# Patient Record
Sex: Male | Born: 1965 | Race: White | Hispanic: No | Marital: Married | Smoking: Former smoker
Health system: Southern US, Community
[De-identification: ages and names within clinical notes are randomized; demographics above are authoritative.]

## PROBLEM LIST (undated history)

## (undated) DIAGNOSIS — M179 Osteoarthritis of knee, unspecified: Secondary | ICD-10-CM

## (undated) DIAGNOSIS — M1710 Unilateral primary osteoarthritis, unspecified knee: Secondary | ICD-10-CM

## (undated) DIAGNOSIS — I10 Essential (primary) hypertension: Secondary | ICD-10-CM

## (undated) DIAGNOSIS — R7303 Prediabetes: Secondary | ICD-10-CM

## (undated) DIAGNOSIS — N200 Calculus of kidney: Secondary | ICD-10-CM

## (undated) DIAGNOSIS — M722 Plantar fascial fibromatosis: Secondary | ICD-10-CM

## (undated) HISTORY — DX: Plantar fascial fibromatosis: M72.2

## (undated) HISTORY — DX: Osteoarthritis of knee, unspecified: M17.9

## (undated) HISTORY — DX: Prediabetes: R73.03

## (undated) HISTORY — DX: Calculus of kidney: N20.0

## (undated) HISTORY — DX: Unilateral primary osteoarthritis, unspecified knee: M17.10

---

## 1985-10-22 HISTORY — PX: KNEE ARTHROSCOPY: SHX127

## 2000-08-11 ENCOUNTER — Emergency Department (HOSPITAL_COMMUNITY): Admission: EM | Admit: 2000-08-11 | Discharge: 2000-08-11 | Payer: Self-pay | Admitting: Emergency Medicine

## 2000-08-29 ENCOUNTER — Encounter: Payer: Self-pay | Admitting: Emergency Medicine

## 2000-08-29 ENCOUNTER — Inpatient Hospital Stay (HOSPITAL_COMMUNITY): Admission: EM | Admit: 2000-08-29 | Discharge: 2000-09-02 | Payer: Self-pay | Admitting: Emergency Medicine

## 2000-08-29 ENCOUNTER — Encounter: Payer: Self-pay | Admitting: Internal Medicine

## 2000-08-30 ENCOUNTER — Encounter: Payer: Self-pay | Admitting: Internal Medicine

## 2000-08-31 ENCOUNTER — Encounter: Payer: Self-pay | Admitting: Internal Medicine

## 2000-09-09 ENCOUNTER — Ambulatory Visit (HOSPITAL_COMMUNITY): Admission: RE | Admit: 2000-09-09 | Discharge: 2000-09-09 | Payer: Self-pay | Admitting: Internal Medicine

## 2000-10-07 ENCOUNTER — Encounter: Payer: Self-pay | Admitting: Emergency Medicine

## 2000-10-07 ENCOUNTER — Inpatient Hospital Stay (HOSPITAL_COMMUNITY): Admission: EM | Admit: 2000-10-07 | Discharge: 2000-10-10 | Payer: Self-pay | Admitting: *Deleted

## 2000-10-18 ENCOUNTER — Encounter: Admission: RE | Admit: 2000-10-18 | Discharge: 2000-10-18 | Payer: Self-pay | Admitting: Internal Medicine

## 2005-06-29 ENCOUNTER — Emergency Department (HOSPITAL_COMMUNITY): Admission: EM | Admit: 2005-06-29 | Discharge: 2005-06-29 | Payer: Self-pay | Admitting: Emergency Medicine

## 2010-05-30 ENCOUNTER — Emergency Department (HOSPITAL_COMMUNITY): Admission: EM | Admit: 2010-05-30 | Discharge: 2010-05-31 | Payer: Self-pay | Admitting: Emergency Medicine

## 2011-01-05 LAB — CBC
HCT: 43.1 % (ref 39.0–52.0)
MCV: 90.7 fL (ref 78.0–100.0)
Platelets: 114 10*3/uL — ABNORMAL LOW (ref 150–400)
RDW: 12.5 % (ref 11.5–15.5)

## 2011-01-05 LAB — URINALYSIS, ROUTINE W REFLEX MICROSCOPIC
Bilirubin Urine: NEGATIVE
Glucose, UA: NEGATIVE mg/dL
Nitrite: NEGATIVE
Protein, ur: NEGATIVE mg/dL
Specific Gravity, Urine: 1.024 (ref 1.005–1.030)

## 2011-01-05 LAB — URINE MICROSCOPIC-ADD ON

## 2011-01-05 LAB — BASIC METABOLIC PANEL
Calcium: 9.3 mg/dL (ref 8.4–10.5)
Chloride: 107 mEq/L (ref 96–112)
Glucose, Bld: 112 mg/dL — ABNORMAL HIGH (ref 70–99)
Potassium: 4.1 mEq/L (ref 3.5–5.1)

## 2011-01-05 LAB — URINE CULTURE
Culture  Setup Time: 201108100210
Culture: NO GROWTH

## 2011-01-05 LAB — DIFFERENTIAL
Basophils Absolute: 0 10*3/uL (ref 0.0–0.1)
Monocytes Relative: 8 % (ref 3–12)
Neutro Abs: 5.3 10*3/uL (ref 1.7–7.7)

## 2011-03-09 NOTE — Consult Note (Signed)
[place]. [facility]  Patient:    Spencer Humphrey, Spencer Humphrey                      MRN: [id number] Proc. Date: 08/30/00 Adm. Date:  [id number] Attending:  Farley Ly CC:         Medical Teaching Service   Consultation Report  REASON FOR CONSULTATION:  Thank you for asking me to see this 45 year old white male for cardiologic opinion regarding chest discomfort.  HISTORY OF PRESENT ILLNESS:  The patient has a somewhat sketchy past medical history. He admits to being treated for alcohol abuse by Dr. Jeannetta Nap office in [facility]. He, according to a private history obtained, has a history of alcohol abuse dating back to his high school years. He has a prior history of DTs according to his wife, greater than 5 years ago. Was in [employer] maybe in the late 1990s. He admits to having been consuming at least a pint of alcohol for the past year. Thinks his last drink was 2 days prior to admission. He states that he was walking in the grocery store and had a nosebleed. Had difficulty picking up milk with his left arm and then had what he described as crushing substernal chest pain. He presented to the emergency room where an EKG was unremarkable, and initial CPKs were unremarkable. He complained of inability with his grip, a headache, and significant shortness of breath. He presented with some palpitations also. He presented to the emergency room where he was found to be hypertensive and had alcohol on his breath. He was admitted yesterday with a presumed diagnosis of unstable angina pectoris. Initial enzymes were unremarkable, but he continued to have chest pain and has been treated aggressively with Lovenox and with nitroglycerin. He had a seizure this evening on the floor, and he was moved into the intensive care unit. When I initially questioned him, he was not certain why he entered the hospital and then remembered that he came here because of chest pain  and the other above-noted complaints. I was requested to give an opinion regarding the chest pain for the teaching service who suspected unstable angina in a patient with multiple risks.  PAST MEDICAL HISTORY:  Fairly benign. There maybe a prior history of mild hypertension for the past month. There is no history of ulcers or diabetes. His cholesterol status is unknown.  PAST SURGICAL HISTORY:  Knee surgery.  ALLERGIES:  PENICILLIN.  MEDICATIONS PRIOR TO DISCHARGE:  None.  SOCIAL HISTORY:  He works as a Engineer, maintenance, and he smokes between a pack and pack and a half of cigarettes per day. Alcohol history as above. He has been married to his current wife for 3 years. Was formerly married before that and has one child by a previous marriage and they share a 58-year-old child. They have three small children that live in the home with them, ages 32, 5, and 2. Remote history of marijuana abuse. No cocaine or other drug usage that he will admit to.  FAMILY HISTORY:  His mother had a myocardial infarction approximately 1 year ago in her early 77s. His father is a diabetic.  REVIEW OF SYSTEMS:  He has had some vague weight loss. He denies PND, orthopnea, or edema. He has not had a prior history of exertional-type chest pain. Does have the significant alcohol intoxication as noted above.  PHYSICAL EXAMINATION:  GENERAL:  He is a somewhat red-faced male who  is not currently in any acute distress.  VITAL SIGNS:  His blood pressure was 140/80, his pulse was 76 and was regular.  SKIN:  Erythematous.  ENT:  Unremarkable.  NECK:  There was no thyromegaly or carotid bruits.  LUNGS:  Clear bilaterally.  CARDIAC:  Normal S1 and S2. No S3, S4, or murmur.  ABDOMEN:  Soft and nontender. No mass or organomegaly. There was no rebound tenderness noted.  EXTREMITIES:  Femoral and distal pulses were 2+.  LABORATORY DATA:  Initial ECGs were completely normal.  Lab data shows a  cholesterol of 373, triglyceride of 1843, with an HDL of 34. He has a lipase of 58. His alkaline phosphatase is 279, SGOT of 430, and SGPT of 80, albumin is only 3.2. Protime was 14.2 sec which was slightly elevated. CBC showed a hemoglobin of 14.4, hematocrit of 39.3.  IMPRESSION: 1. Chest discomfort with some typical and other atypical features in a patient    with multiple cardiovascular risks factors, including an early positive    family history, dyslipidemic cholesterol panel, and a history of cigarette    abuse, and some hypertension on admission. It is very difficult because of    the alcoholism to sort out whether the chest pain is ischemic in nature or    whether it is noncardiac. In addition, he has multiple metabolic    abnormalities related to acute alcohol abuse which make workup problematic    at this time. 2. Hypertension on admission may be related to alcoholism. 3. Former history of alcoholism. See note above. 4. Presumed delirium tremens with recent seizure. 5. Recent nosebleed. 6. Thrombocytopenia possibly due to alcoholism. 7. Dyslipidemia.  RECOMMENDATIONS:  At the present time, the patient is not able to give a reliable history. He has a normal EKG but does have multiple cardiovascular risk factors. All of his cardiac enzymes show no evidence of ischemia or infarction at this time. He has markedly abnormal liver enzymes. My recommendation would be to continue treatment for delirium tremens and acute alcoholism. I would stop his nitroglycerin at the present time. He has thrombocytopenia and I may consider stopping Lovenox because of the thrombocytopenia. I would treat him with fairly high-dose beta blockers and observe for further evidence of DTs, allow liver enzymes to return to normal. He does have a fairly high-risk lipid panel; but in the presence of acute alcoholism, it is hard to tell the validity of this. He does need to stop smoking, does need control of  lipids, and does need to have a cardiac workup.  However, it is difficult to determine whether that needs to be done while he is an inpatient or whether this can be done as an outpatient once he is over the acute alcohol episode. I will be glad to follow him along with you, and I appreciate seeing him with you. DD:  08/30/00 TD:  08/31/00 Job: [id number]

## 2011-03-09 NOTE — Discharge Summary (Signed)
[place]. [facility]  Patient:    Spencer Humphrey, Spencer Humphrey                      MRN: [id number] Adm. Date:  [id number] Disc. Date: [id number] Attending:  Arsenio Loader Dictator:   Doreatha Martin, M.D.                           Discharge Summary  DISCHARGE DIAGNOSES: 1. Alcoholic pancreatitis. 2. Alcohol abuse. 3. Hypertension. 4. Alcoholic liver disease. 5. Hyponatremia. 6. History of delirium tremens.  DISCHARGE MEDICATIONS: 1. Folate 1 mg p.o. q.d. 2. Thiamine 100 mg p.o. q.d. 3. Lopressor 50 mg p.o. b.i.d.  HISTORY OF PRESENT ILLNESS:  Spencer Humphrey is a 45 year old white male with a history significant for hypertension, pancreatitis, and alcohol abuse.  He presents with a three-day complaint abdominal pain that radiates through his back, and he is also complaining of nausea and vomiting as well as decreased appetite.  The patient denies any hematemesis, hematochezia, or melena.  The patient also denies diarrhea.  He states that the pain is a 9/10 at its worst. He does deny fever, chills, and shortness of breath.  He does complain of decreased urine output; however, he denies dysuria.  Right now, he is complaining of right lower quadrant pain.  The patient denies chest pain, shortness of breath, cough, headache.  The patient states he drank one beer the night prior to presentation to the emergency room while in front of his wife.  However, when questioned by himself, he does admit to having been drinking quite heavily for the past several days.  PAST MEDICAL HISTORY:  Significant for hypertension, pancreatitis, alcohol abuse, nephrolithiasis x 2, and a UTI in November 2001.  ALLERGIES:  PENICILLIN gives him a rash.  MEDICATIONS:  He is not taking any, but he states that he is supposed to be on Lopressor 50 mg twice a day, thiamine, aspirin, folate, and Protonix 40 mg every day.  SOCIAL HISTORY:  He lives with his wife and children in [place] and  works as a heavy Media planner.  He states that he had been sober for the past four years while he was attending AA meeting but that he "fell off the wagon." He denies having lost any work recently.  He has smoked a pack a day for the past four years and denies any IV drug use.  FAMILY HISTORY:  Significant for a father with diabetes and a mother who experienced a myocardial infarction as well as ovarian cancer.  PHYSICAL EXAMINATION:  VITAL SIGNS:  On admission, temperature 98.7, pulse 130, blood pressure 148/102, respirations 22, oxygen saturation 98% on room air.  GENERAL:  This is a pleasant, well groomed white male in no apparent distress who denies any use of alcohol while in the presence of his wife and has difficulty admitting it when alone.  HEENT:  PERRLA.  No icterus.  No oropharyngeal lesions.  NECK:  No JVD, no thyromegaly, no lymphadenopathy, supple.  LUNGS:  Clear to auscultation bilaterally.  CARDIOVASCULAR:  Regular rate and rhythm, S1, S2.  No murmurs, rubs, or gallops.  ABDOMEN:  Soft.  Tender right lower quadrant and epigastrium with some guarding, increased bowel sounds, nondistended.  EXTREMITIES:  No edema.  Pedal pulses 2+ bilaterally.  RECTAL:  Stool heme negative.  LABORATORY DATA:  White blood cells 9.0, hemoglobin 13.9, platelets 119. Sodium 127, potassium 3.5, chloride 89, bicarb  22, BUN 13, creatinine 1.2, glucose 122.  Calcium 8.3, total protein 6.7, albumin 3.1, lipase 190, total bilirubin 2.9, AST 204, ALT 127, alkaline phosphatase 322.  Urinalysis: Specific gravity 1.025, protein 30, otherwise within normal limits.  CT of the abdomen without contrast showed gallbladder sludge and a fatty liver.  No nephrolithiasis.  CT of the abdomen with contrast showed a fatty liver and a prominent pancreatic head that may be indicative of early pancreatitis.  Vitamin B12 within normal limits.  Folate level within normal limits.  TSH within  normal limits.  HISTORY OF PRESENT ILLNESS:  #1 - ALCOHOLIC PANCREATITIS:  The patient was admitted and made n.p.o. Aggressive IV hydration was started with D5 normal saline at 250 cc per hour with potassium chloride supplementation.  The patient was given pain control with morphine sulfate 5 to 10 mg IV every 4 hours p.r.n.  His lipase on admission was 190.  His alcohol level was less than 10.  The patient remained n.p.o. for the next 24 to 36 hours, and on the third hospital day, he was started on a clear liquid diet which he tolerated well and was therefore advanced to a full diet by dinner time on the third day of hospital stay.  At this time, IV fluids and pain medications were discontinued.  #2 - ALCOHOL ABUSE:  The patient was given Librium for delirium tremens precautions as he has had a history of this problem and also admitted to have been drinking heavily and that he was starting to feel "shaky."  For this reason, the patient was started on a Librium taper at the time of admission, and taper was finished just prior to discharge.  The patient remained without any signs or symptoms of withdrawal during this hospitalization.  His diet was supplemented with thiamine, folate, and a multivitamin from the time of admission.  The patient was encouraged to return to the Alcoholics Anonymous meetings that he had attended in the past and had helped him quit drinking alcohol.  The patient stated that he understands that he has a problem and that returning to AA would probably be helpful; however, I am not sure if this is something that he is committed to, in particular because this is something that he continues to try to hide from his wife.  It is a very unfortunate situation.  #3 - HYPERTENSION:  The patient was admitted with elevated blood pressure at 148/102.  He was started on Lopressor 50 mg p.o. b.i.d. which is dosage of this medication he is supposed to be taking at home.  His  blood pressure responded nicely with systolic in the 130s and diastolic in the 70s to 90s.   #4 - ALCOHOLIC LIVER DISEASE:  The patient had elevated liver enzymes at the time of admission.  However, his coags were within normal limits with a PT of 12.1, INR 0.9, and PTT 28.  His liver enzymes subsequently decreased towards normal levels by the time patient was discharged.  His AST was 97, ALT 75, and alkaline phosphatase 237.  His total bilirubin was 1.1.  Again, patient was strongly encouraged to discontinue abuse of alcohol in order to prevent further damage to his liver.  #5 - HYPONATREMIA:  The patient came in with a sodium of 127.  This was likely secondary to GI loss associated with vomiting on the previous day.  The patient was rehydrated, and his sodium came back to normal levels.  At the time of discharge, his  sodium was 138.  #6 - ANEMIA:  The patient came in with a hemoglobin of 13.9 and stools that were guaiac negative.  After hydration, his hemoglobin dropped to 11.9 which indeed uncovered an anemia.  Vitamin B12 and folate levels were within normal limits.  As mentioned earlier, his stool was heme negative.  There is also no history of GI bleed.  I believe that this low hemoglobin is likely to be part of pancytopenia as evidenced by a low white blood count of 4.6 as well as thrombocytopenia at 83,000 platelets.  FOLLOWUP:  The patient is to follow up with Dr. Alwyn Ren at the [facility].  An appointment has been given for this patient. DD:  10/14/00 TD:  10/15/00 Job: [id number]

## 2011-03-09 NOTE — Discharge Summary (Signed)
[place]. [facility]  Patient:    AUSAR, GEORGIOU                      MRN: [id number] Adm. Date:  [id number] Disc. Date: [id number] Attending:  Farley Ly Dictator:   Bernerd Pho, M.D. CC:         [place]. [facility]  Tawni Millers, M.D.   Discharge Summary  DISCHARGE DIAGNOSES: 1. Chest pain, possibly gastrointestinal etiology. 2. Alcoholism. 3. Hypertension. 4. Urinary tract infection.  DISCHARGE MEDICATIONS: 1. Librium taper x 4 doses. 2. Thiamine 100 mg p.o. q.d. 3. Folate 1 mg p.o. q.d. 4. Lopressor 50 mg p.o. b.i.d. 5. Enteric-coated aspirin 325 mg p.o. q.d. 6. Protonix 40 mg p.o. q.d. 7. Ciprofloxacin 200 mg p.o. q.d. x 2 days.  CONSULTATIONS:  Darden Palmer., M.D., of cardiology.  PROCEDURES:  None.  CHIEF COMPLAINT:  Chest pain and nose bleed.  HISTORY OF PRESENT ILLNESS:  The patient is a 45 year old, Caucasian male with a history of hypertension, who presents with a nose bleed, pounding headache, and chest pressure for the last four and a half hours.  The patient states that he was shopping at [facility] when he developed a profuse nose bleed and pounding headache.  The patient checked his blood pressure at the automated blood pressure machine and reported his blood pressure being in the 200s/160s. The patient subsequently developed left arm numbness and weakness and chest pressure radiating from the back to the anterior chest on the left side.  The patient was diaphoretic and acknowledged shortness of breath in the ambulance. The patient denies any history of coronary artery disease.  The patient acknowledges a family history of coronary artery disease and smoking.  He is unsure of cholesterol.  In the emergency department, the patients chest pain was 4/10 after receiving three nitroglycerin sublingual.  PAST MEDICAL HISTORY:  1. Hypertension diagnosed roughly two months  ago.  The patient is not on any medications secondary to fears of being impotent. 2. Bilateral knee arthroscopy.  3. Cystoscopy for kidney stones in 1995.  MEDICATIONS ON ADMISSION:  None.  ALLERGIES:  PENICILLIN.  FAMILY HISTORY:  Remarkable for mother with TIAs and heart attack at age 66 and father with diabetes.  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  The patient acknowledges smoking one pack per day and initially denied drinking for the last fours. However, when the patient was confronted about alcohol privately, he admitted to heavy drinking, stating that he has also been to [facility] in the past for detoxification.  REVIEW OF SYSTEMS:  No fever, chills, or weight loss.  PHYSICAL EXAMINATION:  Temperature 97.3 degrees, heart rate 81, blood pressure 145/104, respiratory rate 18, oxygen saturation on room air 99%.  GENERAL APPEARANCE:  The patient is a well-developed, well-nourished, white male in mild distress, who smells of alcohol.  HEENT:  Normocephalic and atraumatic.  Oropharynx clear.  No exudate or lymphadenopathy appreciated.  CARDIOVASCULAR:  Regular rate and rhythm without any murmurs, rubs, clicks, or gallops.  No JVD.  PMI just lateral to the midclavicular line.  RESPIRATORY:  Clear to auscultation bilaterally without any rhonchi, rales, or wheezes.  GASTROINTESTINAL:  Mild tenderness to palpation of the left lower quadrant. Nondistended, soft.  Positive bowel sounds in all four quadrants.  EXTREMITIES:  No clubbing, cyanosis, or edema.  Pulses 2+ throughout.  NEUROLOGIC:  Alert and oriented x 3.  Nonfocal exam.  ADMISSION LABORATORIES:  Admission cardiac enzymes as follows:  CK 192, MB 1.2, troponin I 0.02.  PT 14.2, PTT 35.  Electrolytes are as follows: Sodium 132, potassium 3.6, chloride 93, bicarbonate 29, BUN 14, creatinine 1.3, glucose 96, AST 412, ALT 84,  alkaline phosphatase 260, total bilirubin 1.4.  Urine drug screen negative.  UA showed trace leukocytes, negative nitrite, 6-10 white blood cells on urine microscopic, and many bacteria on urine microscopic.  The patients admission portable chest x-ray showed no active disease and normal mediastinum.  The patients head CT was negative.  His EKG showed a normal sinus rhythm with a rate of 82 and normal interval.  The patient did have inverted T waves in V1 and V2.  There were no prior EKGs for comparison.  The patients admission lipase was 58.  His alcohol level was 265.  HOSPITAL COURSE: #1 - CHEST PAIN:  The patient was admitted and closely monitored on telemetry. The patient was ruled out for myocardial infarction with three sets of cardiac enzymes and serial EKGs.  The patient was started on a beta blocker, aspirin, and a nitroglycerin drip.  The patient was seen by W. Ashley Royalty., M.D., of cardiology, who felt that the patient might benefit from an outpatient treadmill stress test.  Otherwise further cardiac work-up was not recommended.  The patient did well in regards to his chest pain.  The patient was titrated off of the nitroglycerin drip without any further recurrence of chest pain.  The patient is being discharged with Lopressor 50 mg p.o. b.i.d. and enteric-coated aspirin 325 mg p.o. q.d.  He has a follow-up appointment with Alvester Morin, M.D., for an exercise treadmill test on September 09, 2000, at 1 p.m.  #2 - ALCOHOLISM:  The patient initially denied alcohol consumption in front of his family, but later during the hospitalization admitted to his alcohol use. On the second day of admission, the patient began having tremors and seizure-like activity.  Therefore, he was transferred to the intensive care unit for close observation for DTs.  The patient was started on a Librium  taper and did well.  He was transferred to the floor soon afterwards.  The patient met  with the social worker extensively and he refused any further inpatient detoxification stay.  The patient was only interested in joining Alcoholics Anonymous.  The social worker also met with the patients wife and provided information on [place]-Non and other resources for her.  The patient was discharged with a total of four doses of Librium 25 mg to be tapered.  The patient was told not to take Librium if he continued drinking.  The patients wife was instructed to dispense the Librium only if the patient was sober. The patient was also instructed not to operate any heavy machinery or drive until he was medically cleared.  The patient had a follow-up appointment at the [facility] on September 05, 2000, with Bernerd Pho, M.D., to be cleared medically.  The patient is being discharged on thiamine, folate, and a proton pump inhibitor for his alcoholism.  The patient is instructed that if he has symptoms of withdrawal to return to the emergency department.  #3 - HYPERTENSION:  The patient was started on a beta blocker and did well during his hospitalization.  The patients admission blood pressures were in the 160s-170s, but on discharge his systolic pressures were  in the 120s-130s. The patients discharge medications included Lopressor 50 mg p.o. b.i.d.  #4 - URINARY TRACT INFECTION:  The patients urine culture came back positive for Escherichia coli.  Therefore, he was treated with ciprofloxacin 250 mg p.o. q.d. x 3 days.  DISPOSITION:  The patient is being discharged to home with instructions not to drive or operate heavy machinery until he is cleared medically. DD:  09/09/00 TD:  09/10/00 Job: [id number]

## 2011-11-18 ENCOUNTER — Encounter (HOSPITAL_COMMUNITY): Payer: Self-pay | Admitting: Emergency Medicine

## 2011-11-18 ENCOUNTER — Emergency Department (HOSPITAL_COMMUNITY)
Admission: EM | Admit: 2011-11-18 | Discharge: 2011-11-18 | Disposition: A | Discharge status: Home / Self Care | Payer: BC Managed Care – PPO | Attending: Emergency Medicine | Admitting: Emergency Medicine

## 2011-11-18 DIAGNOSIS — R51 Headache: Secondary | ICD-10-CM | POA: Insufficient documentation

## 2011-11-18 DIAGNOSIS — T2013XA Burn of first degree of chin, initial encounter: Secondary | ICD-10-CM | POA: Insufficient documentation

## 2011-11-18 DIAGNOSIS — T2016XA Burn of first degree of forehead and cheek, initial encounter: Secondary | ICD-10-CM | POA: Insufficient documentation

## 2011-11-18 DIAGNOSIS — X04XXXA Exposure to ignition of highly flammable material, initial encounter: Secondary | ICD-10-CM | POA: Insufficient documentation

## 2011-11-18 DIAGNOSIS — T2017XA Burn of first degree of neck, initial encounter: Secondary | ICD-10-CM | POA: Insufficient documentation

## 2011-11-18 DIAGNOSIS — H5710 Ocular pain, unspecified eye: Secondary | ICD-10-CM | POA: Insufficient documentation

## 2011-11-18 DIAGNOSIS — I10 Essential (primary) hypertension: Secondary | ICD-10-CM | POA: Insufficient documentation

## 2011-11-18 DIAGNOSIS — T2000XA Burn of unspecified degree of head, face, and neck, unspecified site, initial encounter: Secondary | ICD-10-CM

## 2011-11-18 HISTORY — DX: Essential (primary) hypertension: I10

## 2011-11-18 MED ORDER — ERYTHROMYCIN 5 MG/GM OP OINT: TOPICAL_OINTMENT | OPHTHALMIC | Status: AC

## 2011-11-18 MED ORDER — TETRACAINE HCL 0.5 % OP SOLN
1.0000 [drp] | Freq: Once | OPHTHALMIC | Status: AC
  Administered 2011-11-18: 1 [drp] via OPHTHALMIC
  Filled 2011-11-18: qty 2

## 2011-11-18 MED ORDER — SODIUM CHLORIDE 0.9 % IV BOLUS (SEPSIS)
1000.0000 mL | Freq: Once | INTRAVENOUS | Status: AC
  Administered 2011-11-18: 1000 mL via INTRAVENOUS

## 2011-11-18 MED ORDER — HYDROMORPHONE HCL PF 1 MG/ML IJ SOLN
1.0000 mg | Freq: Once | INTRAMUSCULAR | Status: AC
  Administered 2011-11-18: 1 mg via INTRAVENOUS
  Filled 2011-11-18: qty 1

## 2011-11-18 MED ORDER — BACITRACIN ZINC 500 UNIT/GM EX OINT: TOPICAL_OINTMENT | Freq: Two times a day (BID) | CUTANEOUS | Status: AC

## 2011-11-18 MED ORDER — FLUORESCEIN SODIUM 1 MG OP STRP
1.0000 | ORAL_STRIP | Freq: Once | OPHTHALMIC | Status: AC
  Administered 2011-11-18: 1 via OPHTHALMIC
  Filled 2011-11-18: qty 1

## 2011-11-18 MED ORDER — OXYCODONE-ACETAMINOPHEN 5-325 MG PO TABS: 1.0000 | ORAL_TABLET | ORAL | Status: AC | PRN
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
## 2011-11-18 NOTE — ED Notes (Addendum)
Flush complete . Patient resting with NAD at this time. Oxygen saturation of 100% on RA.
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
## 2011-11-18 NOTE — ED Notes (Addendum)
Flush started bilateral eyes.
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
## 2011-11-19 NOTE — ED Provider Notes (Signed)
I have personally seen and examined the patient.  I have discussed the plan of care with the resident.  I have reviewed the documentation on PMH/FH/Soc. History.  I have reviewed the documentation of the resident and agree.  Doug Sou, MD 11/19/11 [phone]

## 2013-09-21 ENCOUNTER — Encounter: Payer: Self-pay | Admitting: Internal Medicine

## 2013-09-21 DIAGNOSIS — M1710 Unilateral primary osteoarthritis, unspecified knee: Secondary | ICD-10-CM | POA: Insufficient documentation

## 2013-09-21 DIAGNOSIS — M722 Plantar fascial fibromatosis: Secondary | ICD-10-CM | POA: Insufficient documentation

## 2013-09-21 DIAGNOSIS — N200 Calculus of kidney: Secondary | ICD-10-CM | POA: Insufficient documentation

## 2013-09-21 DIAGNOSIS — E559 Vitamin D deficiency, unspecified: Secondary | ICD-10-CM | POA: Insufficient documentation

## 2013-09-21 DIAGNOSIS — R7303 Prediabetes: Secondary | ICD-10-CM | POA: Insufficient documentation

## 2013-09-21 DIAGNOSIS — I10 Essential (primary) hypertension: Secondary | ICD-10-CM | POA: Insufficient documentation

## 2013-09-23 ENCOUNTER — Encounter: Payer: Self-pay | Admitting: Physician Assistant

## 2013-09-23 ENCOUNTER — Ambulatory Visit: Payer: Self-pay | Admitting: Internal Medicine

## 2013-09-23 ENCOUNTER — Ambulatory Visit (INDEPENDENT_AMBULATORY_CARE_PROVIDER_SITE_OTHER): Payer: BC Managed Care – PPO | Admitting: Physician Assistant

## 2013-09-23 VITALS — BP 128/72 | HR 72 | Temp 98.3°F | Resp 16 | Ht 73.0 in | Wt 243.0 lb

## 2013-09-23 DIAGNOSIS — R7303 Prediabetes: Secondary | ICD-10-CM

## 2013-09-23 DIAGNOSIS — I10 Essential (primary) hypertension: Secondary | ICD-10-CM

## 2013-09-23 DIAGNOSIS — E782 Mixed hyperlipidemia: Secondary | ICD-10-CM | POA: Insufficient documentation

## 2013-09-23 DIAGNOSIS — R7309 Other abnormal glucose: Secondary | ICD-10-CM

## 2013-09-23 DIAGNOSIS — E559 Vitamin D deficiency, unspecified: Secondary | ICD-10-CM

## 2013-09-23 DIAGNOSIS — E785 Hyperlipidemia, unspecified: Secondary | ICD-10-CM

## 2013-09-23 LAB — CBC WITH DIFFERENTIAL/PLATELET
Basophils Absolute: 0 10*3/uL (ref 0.0–0.1)
Basophils Relative: 0 % (ref 0–1)
Eosinophils Relative: 3 % (ref 0–5)
Lymphocytes Relative: 41 % (ref 12–46)
Lymphs Abs: 2 10*3/uL (ref 0.7–4.0)
MCH: 31.3 pg (ref 26.0–34.0)
MCHC: 35.9 g/dL (ref 30.0–36.0)
MCV: 87.2 fL (ref 78.0–100.0)
Monocytes Absolute: 0.5 10*3/uL (ref 0.1–1.0)
Neutro Abs: 2.2 10*3/uL (ref 1.7–7.7)
RBC: 4.53 MIL/uL (ref 4.22–5.81)

## 2013-09-23 LAB — BASIC METABOLIC PANEL WITH GFR
CO2: 26 mEq/L (ref 19–32)
Chloride: 104 mEq/L (ref 96–112)
Creat: 1.39 mg/dL — ABNORMAL HIGH (ref 0.50–1.35)
Glucose, Bld: 94 mg/dL (ref 70–99)

## 2013-09-23 LAB — HEPATIC FUNCTION PANEL
ALT: 25 U/L (ref 0–53)
Albumin: 4.2 g/dL (ref 3.5–5.2)
Alkaline Phosphatase: 100 U/L (ref 39–117)
Bilirubin, Direct: 0.1 mg/dL (ref 0.0–0.3)
Indirect Bilirubin: 0.5 mg/dL (ref 0.0–0.9)

## 2013-09-23 LAB — LIPID PANEL
Cholesterol: 183 mg/dL (ref 0–200)
HDL: 33 mg/dL — ABNORMAL LOW (ref >39)
Total CHOL/HDL Ratio: 5.5 Ratio

## 2013-09-23 LAB — TSH: TSH: 2.507 u[IU]/mL (ref 0.350–4.500)

## 2013-09-23 MED ORDER — ALPRAZOLAM 1 MG PO TABS: 1.0000 mg | ORAL_TABLET | Freq: Three times a day (TID) | ORAL | Status: DC | PRN

## 2013-09-23 NOTE — Patient Instructions (Signed)

## 2013-09-23 NOTE — Progress Notes (Addendum)
HPI Patient presents for 3 month follow up with hypertension, hyperlipidemia, prediabetes and vitamin D. Patient's blood pressure has been controlled at home. Patient denies chest pain, shortness of breath, dizziness.  Patient's cholesterol is diet controlled.The cholesterol last visit was LDL was 70 but trigs were elevated and HDL was low. The patient has been working on diet and exercise for prediabetes, and denies changes in vision, polys, and paresthesias.   Was treated for back pain and plantar fasciitis with prednisone and states both have resolved. Need to check A1C due to this.  Patient is on Vitamin D supplement.    Current Medications:  Current Outpatient Prescriptions on File Prior to Visit  Medication Sig Dispense Refill  . acetaminophen (TYLENOL) 325 MG tablet Take 650 mg by mouth every 6 (six) hours as needed.      . bisoprolol-hydrochlorothiazide (ZIAC) 5-6.25 MG per tablet Take 1 tablet by mouth daily.      . Cholecalciferol (VITAMIN D PO) Take 8,000 Int'l Units by mouth daily.      Marland Kitchen ibuprofen (ADVIL,MOTRIN) 200 MG tablet Take 200 mg by mouth every 6 (six) hours as needed.      . Omega-3 Fatty Acids (FISH OIL PO) Take 1,000 mg by mouth daily.       No current facility-administered medications on file prior to visit.   Medical History:  Past Medical History  Diagnosis Date  . Hypertension   . Kidney stones   . Plantar fasciitis   . Prediabetes   . DJD (degenerative joint disease) of knee     Right   Allergies:  Allergies  Allergen Reactions  . Penicillins Other (See Comments)    Unknown    ROS Constitutional: Denies fever, chills, weight loss/gain, headaches, insomnia, fatigue, night sweats, and change in appetite. Eyes: Denies redness, blurred vision, diplopia, discharge, itchy, watery eyes.  ENT: Denies discharge, congestion, post nasal drip, sore throat, earache, dental pain, Tinnitus, Vertigo, Sinus pain, snoring.  Cardio: Denies chest pain, palpitations,  irregular heartbeat,  dyspnea, diaphoresis, orthopnea, PND, claudication, edema Respiratory: denies cough, dyspnea,pleurisy, hoarseness, wheezing.  Gastrointestinal: Denies dysphagia, heartburn,  water brash, pain, cramps, nausea, vomiting, bloating, diarrhea, constipation, hematemesis, melena, hematochezia,  hemorrhoids Genitourinary: Denies dysuria, frequency, urgency, nocturia, hesitancy, discharge, hematuria, flank pain Musculoskeletal: Denies arthralgia, myalgia, stiffness, Jt. Swelling, pain, limp, and strain/sprain. Skin: Denies pruritis, rash, hives, warts, acne, eczema, changing in skin lesion Neuro: Denies Weakness, tremor, incoordination, spasms, paresthesia, pain Psychiatric: Denies confusion, memory loss, sensory loss Endocrine: Denies change in weight, skin, hair change, nocturia, and paresthesia, Diabetic Polys, Denies visual blurring, hyper /hypo glycemic episodes.  Heme/Lymph: Excessive bleeding, bruising, enlarged lymph nodes  Family history- Review and unchanged Social history- Review and unchanged Physical Exam: Filed Vitals:   09/23/13 0902  BP: 128/72  Pulse: 72  Temp: 98.3 F (36.8 C)  Resp: 16   Filed Weights   09/23/13 0902  Weight: 243 lb (110.224 kg)   General Appearance: Well nourished, in no apparent distress. Eyes: PERRLA, EOMs, conjunctiva no swelling or erythema, normal fundi and vessels. Sinuses: No Frontal/maxillary tenderness ENT/Mouth: Ext aud canals clear, with TMs without erythema, bulging.No erythema, swelling, or exudate on post pharynx.  Tonsils not swollen or erythematous. Hearing normal.  Neck: Supple, thyroid normal.  Respiratory: Respiratory effort normal, BS equal bilaterally without rales, rhonchi, wheezing or stridor.  Cardio: Heart sounds normal, regular rate and rhythm without murmurs, rubs or gallops. Peripheral pulses brisk and equal bilaterally, without edema.  Abdomen: Obese, soft, with  bowel sounds. Non tender, no guarding,  rebound, hernias, masses, or organomegaly.  Lymphatics: Non tender without lymphadenopathy.  Musculoskeletal: Full ROM all peripheral extremities, joint stability, 5/5 strength, and normal gait. Skin: Warm, dry without rashes, lesions, ecchymosis.  Neuro: Cranial nerves intact, reflexes equal bilaterally. Normal muscle tone, no cerebellar symptoms. Sensation intact.  Psych: Awake and oriented X 3, normal affect, Insight and Judgment appropriate.   Assessment and Plan:  Hypertension: Continue medication, monitor blood pressure at home. Continue DASH diet. Cholesterol: Continue diet and exercise. Check cholesterol.  Pre-diabetes-Continue diet and exercise. Check A1C Vitamin D Def- check level and continue medications.   Quentin Mulling 9:24 AM   Patient's trigs were in the 600's will call in fenofibrate since patient is at risk for pancreatitis. Counseled on diet. Will follow up lab only in 4-6 weeks.

## 2013-09-24 LAB — INSULIN, FASTING: Insulin fasting, serum: 93 u[IU]/mL — ABNORMAL HIGH (ref 3–28)

## 2013-09-24 LAB — VITAMIN D 25 HYDROXY (VIT D DEFICIENCY, FRACTURES): Vit D, 25-Hydroxy: 39 ng/mL (ref 30–89)

## 2013-09-24 MED ORDER — FENOFIBRATE MICRONIZED 134 MG PO CAPS: 134.0000 mg | ORAL_CAPSULE | Freq: Every day | ORAL | Status: DC

## 2013-09-24 NOTE — Addendum Note (Signed)
Addended by: Quentin Mulling R on: 09/24/2013 08:41 AM   Modules accepted: Orders

## 2013-10-20 ENCOUNTER — Other Ambulatory Visit: Payer: Self-pay | Admitting: Physician Assistant

## 2013-10-20 MED ORDER — ALPRAZOLAM 1 MG PO TABS: 1.0000 mg | ORAL_TABLET | Freq: Three times a day (TID) | ORAL | Status: DC | PRN

## 2013-11-21 ENCOUNTER — Other Ambulatory Visit: Payer: Self-pay | Admitting: Physician Assistant

## 2013-11-25 ENCOUNTER — Other Ambulatory Visit: Payer: Self-pay | Admitting: Physician Assistant

## 2013-11-25 MED ORDER — ALPRAZOLAM 1 MG PO TABS: 1.0000 mg | ORAL_TABLET | Freq: Three times a day (TID) | ORAL | Status: DC | PRN

## 2013-12-11 ENCOUNTER — Encounter: Payer: Self-pay | Admitting: Emergency Medicine

## 2013-12-11 ENCOUNTER — Ambulatory Visit (INDEPENDENT_AMBULATORY_CARE_PROVIDER_SITE_OTHER): Payer: BC Managed Care – PPO | Admitting: Emergency Medicine

## 2013-12-11 VITALS — BP 128/82 | HR 60 | Temp 98.4°F | Resp 18 | Ht 73.0 in | Wt 250.0 lb

## 2013-12-11 DIAGNOSIS — M549 Dorsalgia, unspecified: Secondary | ICD-10-CM

## 2013-12-11 MED ORDER — HYDROCODONE-ACETAMINOPHEN 5-325 MG PO TABS: 1.0000 | ORAL_TABLET | Freq: Four times a day (QID) | ORAL | Status: DC | PRN

## 2013-12-11 MED ORDER — PREDNISONE 10 MG PO TABS: ORAL_TABLET | ORAL | Status: DC

## 2013-12-11 MED ORDER — CYCLOBENZAPRINE HCL 10 MG PO TABS: 10.0000 mg | ORAL_TABLET | Freq: Three times a day (TID) | ORAL | Status: DC | PRN
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
## 2013-12-11 NOTE — Patient Instructions (Signed)

## 2014-01-20 ENCOUNTER — Other Ambulatory Visit: Payer: Self-pay | Admitting: Physician Assistant

## 2014-02-12 ENCOUNTER — Emergency Department (HOSPITAL_COMMUNITY): Payer: Worker's Compensation

## 2014-02-12 ENCOUNTER — Observation Stay (HOSPITAL_COMMUNITY): Payer: Worker's Compensation

## 2014-02-12 ENCOUNTER — Observation Stay (HOSPITAL_COMMUNITY)
Admission: EM | Admit: 2014-02-12 | Discharge: 2014-02-14 | Disposition: A | Discharge status: Home / Self Care | Payer: Worker's Compensation | Attending: Internal Medicine | Admitting: Internal Medicine

## 2014-02-12 ENCOUNTER — Encounter (HOSPITAL_COMMUNITY): Payer: Self-pay | Admitting: Emergency Medicine

## 2014-02-12 DIAGNOSIS — M1710 Unilateral primary osteoarthritis, unspecified knee: Secondary | ICD-10-CM | POA: Diagnosis not present

## 2014-02-12 DIAGNOSIS — F13239 Sedative, hypnotic or anxiolytic dependence with withdrawal, unspecified: Secondary | ICD-10-CM

## 2014-02-12 DIAGNOSIS — M722 Plantar fascial fibromatosis: Secondary | ICD-10-CM

## 2014-02-12 DIAGNOSIS — F1010 Alcohol abuse, uncomplicated: Secondary | ICD-10-CM | POA: Insufficient documentation

## 2014-02-12 DIAGNOSIS — D72829 Elevated white blood cell count, unspecified: Secondary | ICD-10-CM | POA: Diagnosis not present

## 2014-02-12 DIAGNOSIS — Z79899 Other long term (current) drug therapy: Secondary | ICD-10-CM | POA: Insufficient documentation

## 2014-02-12 DIAGNOSIS — S022XXA Fracture of nasal bones, initial encounter for closed fracture: Secondary | ICD-10-CM | POA: Diagnosis not present

## 2014-02-12 DIAGNOSIS — R748 Abnormal levels of other serum enzymes: Secondary | ICD-10-CM | POA: Insufficient documentation

## 2014-02-12 DIAGNOSIS — F0781 Postconcussional syndrome: Secondary | ICD-10-CM

## 2014-02-12 DIAGNOSIS — R52 Pain, unspecified: Secondary | ICD-10-CM | POA: Insufficient documentation

## 2014-02-12 DIAGNOSIS — S060XAA Concussion with loss of consciousness status unknown, initial encounter: Secondary | ICD-10-CM

## 2014-02-12 DIAGNOSIS — G9340 Encephalopathy, unspecified: Secondary | ICD-10-CM

## 2014-02-12 DIAGNOSIS — E876 Hypokalemia: Secondary | ICD-10-CM | POA: Diagnosis not present

## 2014-02-12 DIAGNOSIS — S060X9A Concussion with loss of consciousness of unspecified duration, initial encounter: Secondary | ICD-10-CM

## 2014-02-12 DIAGNOSIS — R569 Unspecified convulsions: Secondary | ICD-10-CM | POA: Diagnosis present

## 2014-02-12 DIAGNOSIS — X58XXXA Exposure to other specified factors, initial encounter: Secondary | ICD-10-CM | POA: Insufficient documentation

## 2014-02-12 DIAGNOSIS — Z9181 History of falling: Secondary | ICD-10-CM | POA: Diagnosis not present

## 2014-02-12 DIAGNOSIS — N200 Calculus of kidney: Secondary | ICD-10-CM | POA: Insufficient documentation

## 2014-02-12 DIAGNOSIS — M179 Osteoarthritis of knee, unspecified: Secondary | ICD-10-CM

## 2014-02-12 DIAGNOSIS — F23 Brief psychotic disorder: Secondary | ICD-10-CM

## 2014-02-12 DIAGNOSIS — Z87891 Personal history of nicotine dependence: Secondary | ICD-10-CM | POA: Diagnosis not present

## 2014-02-12 DIAGNOSIS — I10 Essential (primary) hypertension: Secondary | ICD-10-CM | POA: Diagnosis present

## 2014-02-12 DIAGNOSIS — F13939 Sedative, hypnotic or anxiolytic use, unspecified with withdrawal, unspecified: Secondary | ICD-10-CM

## 2014-02-12 DIAGNOSIS — E785 Hyperlipidemia, unspecified: Secondary | ICD-10-CM

## 2014-02-12 DIAGNOSIS — E119 Type 2 diabetes mellitus without complications: Secondary | ICD-10-CM | POA: Diagnosis not present

## 2014-02-12 DIAGNOSIS — G9349 Other encephalopathy: Secondary | ICD-10-CM | POA: Insufficient documentation

## 2014-02-12 DIAGNOSIS — G40309 Generalized idiopathic epilepsy and epileptic syndromes, not intractable, without status epilepticus: Principal | ICD-10-CM | POA: Insufficient documentation

## 2014-02-12 DIAGNOSIS — R7303 Prediabetes: Secondary | ICD-10-CM

## 2014-02-12 DIAGNOSIS — E559 Vitamin D deficiency, unspecified: Secondary | ICD-10-CM

## 2014-02-12 DIAGNOSIS — F068 Other specified mental disorders due to known physiological condition: Secondary | ICD-10-CM

## 2014-02-12 LAB — URINALYSIS, ROUTINE W REFLEX MICROSCOPIC
Bilirubin Urine: NEGATIVE
Bilirubin Urine: NEGATIVE
GLUCOSE, UA: NEGATIVE mg/dL
Glucose, UA: NEGATIVE mg/dL
Hgb urine dipstick: NEGATIVE
Hgb urine dipstick: NEGATIVE
KETONES UR: 15 mg/dL — AB
Ketones, ur: 15 mg/dL — AB
Leukocytes, UA: NEGATIVE
Leukocytes, UA: NEGATIVE
Nitrite: NEGATIVE
Nitrite: NEGATIVE
PH: 5 (ref 5.0–8.0)
PROTEIN: NEGATIVE mg/dL
Protein, ur: NEGATIVE mg/dL
SPECIFIC GRAVITY, URINE: 1.021 (ref 1.005–1.030)
Specific Gravity, Urine: 1.023 (ref 1.005–1.030)
Urobilinogen, UA: 0.2 mg/dL (ref 0.0–1.0)
Urobilinogen, UA: 1 mg/dL (ref 0.0–1.0)
pH: 5.5 (ref 5.0–8.0)

## 2014-02-12 LAB — BASIC METABOLIC PANEL
BUN: 19 mg/dL (ref 6–23)
CO2: 23 mEq/L (ref 19–32)
Calcium: 9.6 mg/dL (ref 8.4–10.5)
Chloride: 102 mEq/L (ref 96–112)
Creatinine, Ser: 1.17 mg/dL (ref 0.50–1.35)
GFR calc Af Amer: 84 mL/min — ABNORMAL LOW (ref >90)
GFR calc non Af Amer: 72 mL/min — ABNORMAL LOW (ref >90)
GLUCOSE: 93 mg/dL (ref 70–99)
POTASSIUM: 3.4 meq/L — AB (ref 3.7–5.3)
Sodium: 141 mEq/L (ref 137–147)

## 2014-02-12 LAB — CBC WITH DIFFERENTIAL/PLATELET
Basophils Absolute: 0 10*3/uL (ref 0.0–0.1)
Basophils Relative: 0 % (ref 0–1)
Eosinophils Absolute: 0 10*3/uL (ref 0.0–0.7)
Eosinophils Relative: 0 % (ref 0–5)
HCT: 42.5 % (ref 39.0–52.0)
HEMOGLOBIN: 15.4 g/dL (ref 13.0–17.0)
LYMPHS ABS: 1.3 10*3/uL (ref 0.7–4.0)
Lymphocytes Relative: 9 % — ABNORMAL LOW (ref 12–46)
MCH: 32.8 pg (ref 26.0–34.0)
MCHC: 36.2 g/dL — ABNORMAL HIGH (ref 30.0–36.0)
MCV: 90.6 fL (ref 78.0–100.0)
Monocytes Absolute: 1.3 10*3/uL — ABNORMAL HIGH (ref 0.1–1.0)
Monocytes Relative: 9 % (ref 3–12)
NEUTROS PCT: 82 % — AB (ref 43–77)
Neutro Abs: 11.6 10*3/uL — ABNORMAL HIGH (ref 1.7–7.7)
Platelets: 145 10*3/uL — ABNORMAL LOW (ref 150–400)
RBC: 4.69 MIL/uL (ref 4.22–5.81)
RDW: 13.1 % (ref 11.5–15.5)
WBC: 14.2 10*3/uL — ABNORMAL HIGH (ref 4.0–10.5)

## 2014-02-12 LAB — ETHANOL: Alcohol, Ethyl (B): <11 mg/dL (ref 0–11)

## 2014-02-12 LAB — RAPID URINE DRUG SCREEN, HOSP PERFORMED
Amphetamines: NOT DETECTED
BARBITURATES: NOT DETECTED
BENZODIAZEPINES: NOT DETECTED
COCAINE: NOT DETECTED
Opiates: NOT DETECTED
TETRAHYDROCANNABINOL: NOT DETECTED

## 2014-02-12 LAB — TROPONIN I
Troponin I: <0.3 ng/mL (ref <0.30)
Troponin I: 0.96 ng/mL (ref <0.30)

## 2014-02-12 LAB — TSH: TSH: 2 u[IU]/mL (ref 0.350–4.500)

## 2014-02-12 LAB — PRO B NATRIURETIC PEPTIDE: PRO B NATRI PEPTIDE: 192.9 pg/mL — AB (ref 0–125)

## 2014-02-12 MED ORDER — ENOXAPARIN SODIUM 60 MG/0.6ML ~~LOC~~ SOLN
50.0000 mg | SUBCUTANEOUS | Status: DC
  Administered 2014-02-12 – 2014-02-13 (×2): 50 mg via SUBCUTANEOUS
  Filled 2014-02-12 (×3): qty 0.6

## 2014-02-12 MED ORDER — SODIUM CHLORIDE 0.9 % IV SOLN
1000.0000 mg | INTRAVENOUS | Status: AC
  Administered 2014-02-12: 1000 mg via INTRAVENOUS
  Filled 2014-02-12: qty 10

## 2014-02-12 MED ORDER — ALUM & MAG HYDROXIDE-SIMETH 200-200-20 MG/5ML PO SUSP
30.0000 mL | Freq: Four times a day (QID) | ORAL | Status: DC | PRN
  Administered 2014-02-12: 30 mL via ORAL
  Filled 2014-02-12: qty 30

## 2014-02-12 MED ORDER — LORAZEPAM 2 MG/ML IJ SOLN: 1.0000 mg | INTRAMUSCULAR | Status: DC | PRN

## 2014-02-12 MED ORDER — AMLODIPINE BESYLATE 5 MG PO TABS
5.0000 mg | ORAL_TABLET | Freq: Every day | ORAL | Status: DC
  Administered 2014-02-12 – 2014-02-14 (×3): 5 mg via ORAL
  Filled 2014-02-12 (×4): qty 1

## 2014-02-12 MED ORDER — SODIUM CHLORIDE 0.9 % IV SOLN
500.0000 mg | Freq: Two times a day (BID) | INTRAVENOUS | Status: DC
  Filled 2014-02-12: qty 5

## 2014-02-12 MED ORDER — ACETAMINOPHEN 650 MG RE SUPP: 650.0000 mg | Freq: Four times a day (QID) | RECTAL | Status: DC | PRN

## 2014-02-12 MED ORDER — LEVETIRACETAM ER 500 MG PO TB24: 500.0000 mg | ORAL_TABLET | Freq: Every day | ORAL | Status: DC

## 2014-02-12 MED ORDER — ONDANSETRON HCL 4 MG PO TABS
4.0000 mg | ORAL_TABLET | Freq: Four times a day (QID) | ORAL | Status: DC | PRN
  Administered 2014-02-13: 4 mg via ORAL
  Filled 2014-02-12: qty 1

## 2014-02-12 MED ORDER — ACETAMINOPHEN 325 MG PO TABS
650.0000 mg | ORAL_TABLET | Freq: Four times a day (QID) | ORAL | Status: DC | PRN
  Administered 2014-02-12 (×2): 650 mg via ORAL
  Filled 2014-02-12 (×2): qty 2

## 2014-02-12 MED ORDER — MAGIC MOUTHWASH W/LIDOCAINE
5.0000 mL | Freq: Three times a day (TID) | ORAL | Status: DC | PRN
  Administered 2014-02-12 (×2): 5 mL via ORAL
  Filled 2014-02-12 (×2): qty 5

## 2014-02-12 MED ORDER — LEVETIRACETAM ER 500 MG PO TB24
1500.0000 mg | ORAL_TABLET | Freq: Every day | ORAL | Status: DC
  Administered 2014-02-13 – 2014-02-14 (×2): 1500 mg via ORAL
  Filled 2014-02-12 (×2): qty 3

## 2014-02-12 MED ORDER — ONDANSETRON HCL 4 MG/2ML IJ SOLN: 4.0000 mg | Freq: Four times a day (QID) | INTRAMUSCULAR | Status: DC | PRN

## 2014-02-12 MED ORDER — SODIUM CHLORIDE 0.9 % IV SOLN
INTRAVENOUS | Status: DC
  Administered 2014-02-12: 13:00:00 via INTRAVENOUS

## 2014-02-12 MED ORDER — DOCUSATE SODIUM 100 MG PO CAPS
100.0000 mg | ORAL_CAPSULE | Freq: Two times a day (BID) | ORAL | Status: DC
  Administered 2014-02-12 – 2014-02-13 (×4): 100 mg via ORAL
  Filled 2014-02-12 (×5): qty 1

## 2014-02-12 MED ORDER — GADOBENATE DIMEGLUMINE 529 MG/ML IV SOLN
20.0000 mL | Freq: Once | INTRAVENOUS | Status: AC | PRN
  Administered 2014-02-12: 20 mL via INTRAVENOUS

## 2014-02-12 MED ORDER — LORAZEPAM 0.5 MG PO TABS
0.5000 mg | ORAL_TABLET | Freq: Two times a day (BID) | ORAL | Status: DC
  Administered 2014-02-12 – 2014-02-14 (×5): 0.5 mg via ORAL
  Filled 2014-02-12 (×5): qty 1

## 2014-02-12 MED ORDER — OXYCODONE HCL 5 MG PO TABS
5.0000 mg | ORAL_TABLET | Freq: Four times a day (QID) | ORAL | Status: DC | PRN
  Administered 2014-02-12 – 2014-02-13 (×6): 5 mg via ORAL
  Filled 2014-02-12 (×6): qty 1

## 2014-02-12 NOTE — ED Provider Notes (Signed)
Assumed pt care in sign out from Dr Norlene Campbelltter. Pt with change in behavior over past few days. Possibly multifactorial. [facility]. Records reviewed. He was kept for observation because of mild resting bradycardia which was asymptomatic.  May be some element of post-concussive syndrome. Also just stopped taking xanax. Possible withdrawal. Possibly new onset seizure as well which may be related to stopping benzo. ED w/u pretty unremarkable. Mild leukocytosis. Nonspecific. Doubt infectious etiology. Afebrile. His neck is supple. Repeat head CT today w/o signs of mass/bleed. When speaking with pt he seems quiet and a little slow to respond to questioning although answering appropriately. With possible new onset seizure in setting of just stopping xanax, I feel would be prudent to admit for observation.   Raeford RazorStephen Wilton Thrall, MD 02/12/14 [phone]

## 2014-02-12 NOTE — Significant Event (Signed)
As patient attempted to get out of bed for bathroom, he complained of sudden tightness in chest. "Feels like strap tightening on chest." Vital signs stable. Md notified. Orders received. Will continue to monitor.

## 2014-02-12 NOTE — Progress Notes (Signed)
EEG Completed; Results Pending Rapid response was called during EEG as patient had GTC seizure.

## 2014-02-12 NOTE — ED Notes (Signed)
Pt returned from radiology.
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
## 2014-02-12 NOTE — Progress Notes (Signed)
CRITICAL VALUE ALERT  Critical value received:  Troponin 0.96  Date of notification:  02/12/14  Time of notification:  2313  Critical value read back:yes  Nurse who received alert:  Fia Hebert A.Messer RN BSN  MD notified (1st page):  M.Lynch  Time of first page:  23:16  MD notified (2nd page):  Time of second page:  Responding MD: M.Lynch  Time MD responded: 23:19

## 2014-02-12 NOTE — Progress Notes (Signed)
Called to patient's bedside while patient in EEG.  Per Staff patient had tonic clonic seizure during the testing.  Upon my arrival patient postictal, confused/disoriented, maintaining good airway.  After about 15 min patient back to baseline, conversant.  Does not remember episode.  Patient without complaints. Noted bite marks on tongue.  Transported patient back to room (4N32) VS done. Updated RN on events while in EEG.

## 2014-02-12 NOTE — ED Notes (Signed)
Dr. Camillo at bedside. 

## 2014-02-12 NOTE — H&P (Signed)
[facility] History and Physical  Spencer EtienneRonnie D Hua ZOX:[id number] DOB: [birth year] DOA: 02/12/2014  Referring physician:  PCP: Nadean CorwinMCKEOWN,WILLIAM DAVID, MD   Chief Complaint: Mental Status Changes  HPI: Spencer Humphrey is a 48 y.o. male with a past medical history of hypertension, diet-controlled diabetes mellitus, history of alcohol related seizures approximately 12 years ago, who had a fall from an excavator last Monday at his work site. Patient falling approximately 10 feet from his escalator collecting on his back and head. He was [facility] to [facility] for further evaluation. Patient was kept overnight for observation and discharged the following day in stable condition. Patient  Was noted by his wife to have a generalized tonic-clonic seizure this morning at approximately 3 AM which lasted several minutes. After this event his wife described him as having a "blank stare" confused, disoriented, did not recognize her or her daughter, and proceeded to walk outside in the neighborhood. His wife called the police who found wandering the streets and brought back home. Confusion gradually resolved, presently awake alert and oriented during my encounter in the emergency room. Patient had been taking Xanax at nighttime for sleep which was unknown to his wife until last Wednesday. She took away his Xanax on Wednesday as she was concerned that this was causing sedation and confusion. He denies recreational drug or alcohol abuse and is presently not on antiepileptic medications. CT scan of performed in the emergency room didn't reveal acute intracranial abnormalities. I spoke with Dr. Leroy Kennedyamilo of Neurology from the emergency room.                                                                                                                                                                                                                                                                       Review of  Systems:  Constitutional:  No weight loss, night sweats, Fevers, chills, fatigue.  HEENT:  Positive for headaches, denies Difficulty swallowing,Tooth/dental problems,Sore throat,  No sneezing, itching, ear ache, nasal congestion, post nasal drip,  Cardio-vascular:  No chest pain, Orthopnea, PND, swelling in lower extremities, anasarca, dizziness, palpitations  GI:  No heartburn, indigestion, abdominal pain, nausea, vomiting, diarrhea, change in bowel habits, loss of appetite  Resp:  No  shortness of breath with exertion or at rest. No excess mucus, no productive cough, No non-productive cough, No coughing up of blood.No change in color of mucus.No wheezing.No chest wall deformity  Skin:  no rash or lesions.  GU:  no dysuria, change in color of urine, no urgency or frequency. No flank pain.  Musculoskeletal:  No joint pain or swelling. No decreased range of motion. No back pain.  Psych:  No change in mood or affect. No depression or anxiety. No memory loss.   Past Medical History  Diagnosis Date  . Hypertension   . Kidney stones   . Plantar fasciitis   . Prediabetes   . DJD (degenerative joint disease) of knee     Right   Past Surgical History  Procedure Laterality Date  . Knee arthroscopy Right 1987   Social History:  reports that he quit smoking about 11 years ago. He does not have any smokeless tobacco history on file. He reports that he does not drink alcohol or use illicit drugs.  Allergies  Allergen Reactions  . Penicillins Other (See Comments)    Unknown    No family history on file.   Prior to Admission medications   Medication Sig Start Date End Date Taking? Authorizing Provider  acetaminophen (TYLENOL) 325 MG tablet Take 650 mg by mouth every 6 (six) hours as needed.    Historical Provider, MD  ALPRAZolam Prudy Feeler) 1 MG tablet TAKE 1 TABLET 3 TIMES A DAY AS NEEDED    Quentin Mulling, PA-C  bisoprolol-hydrochlorothiazide (ZIAC) 5-6.25 MG per tablet Take 1 tablet by  mouth daily.    Historical Provider, MD  Cholecalciferol (VITAMIN D PO) Take 8,000 Int'l Units by mouth daily.    Historical Provider, MD  cyclobenzaprine (FLEXERIL) 10 MG tablet Take 1 tablet (10 mg total) by mouth 3 (three) times daily as needed for muscle spasms. 12/11/13 12/11/14  Melissa R Smith, PA-C  fenofibrate micronized (LOFIBRA) 134 MG capsule Take 1 capsule (134 mg total) by mouth daily before breakfast. 09/24/13   Quentin Mulling, PA-C  HYDROcodone-acetaminophen (NORCO) 5-325 MG per tablet Take 1 tablet by mouth every 6 (six) hours as needed for moderate pain. 12/11/13   Berenice Primas, PA-C  Omega-3 Fatty Acids (FISH OIL PO) Take 1,000 mg by mouth daily.    Historical Provider, MD   Physical Exam: Filed Vitals:   02/12/14 0900  BP: 148/86  Pulse: 82  Temp:   Resp: 25    BP 148/86  Pulse 82  Temp(Src) 98.3 F (36.8 C) (Oral)  Resp 25  Ht 6' (1.829 m)  Wt 106.595 kg (235 lb)  BMI 31.86 kg/m2  SpO2 97%  General:  Appears calm and comfortable, in no acute distress. Answering questions appropriately.  Eyes: PERRL, normal lids, irises & conjunctiva ENT: grossly normal hearing, lips & tongue Neck: no LAD, masses or thyromegaly. Has pain to palpation over posterior neck Cardiovascular: RRR, no m/r/g. No LE edema. Telemetry: SR, no arrhythmias  Respiratory: CTA bilaterally, no w/r/r. Normal respiratory effort. Abdomen: soft, ntnd Skin: no rash or induration seen on limited exam Musculoskeletal: No edema, complains of pain over shoulders and neck.  Psychiatric: grossly normal mood and affect, speech fluent and appropriate Neurologic: grossly non-focal.          Labs on Admission:  Basic Metabolic Panel:  Recent Labs Lab 02/12/14 0648  NA 141  K 3.4*  CL 102  CO2 23  GLUCOSE 93  BUN 19  CREATININE 1.17  CALCIUM 9.6   Liver Function Tests: No results found for this basename: AST, ALT, ALKPHOS, BILITOT, PROT, ALBUMIN,  in the last 168 hours No results found for  this basename: LIPASE, AMYLASE,  in the last 168 hours No results found for this basename: AMMONIA,  in the last 168 hours CBC:  Recent Labs Lab 02/12/14 0648  WBC 14.2*  NEUTROABS 11.6*  HGB 15.4  HCT 42.5  MCV 90.6  PLT 145*   Cardiac Enzymes: No results found for this basename: CKTOTAL, CKMB, CKMBINDEX, TROPONINI,  in the last 168 hours  BNP (last 3 results) No results found for this basename: PROBNP,  in the last 8760 hours CBG: No results found for this basename: GLUCAP,  in the last 168 hours  Radiological Exams on Admission: Ct Head Wo Contrast  02/12/2014   CLINICAL DATA:  Recent traumatic injury  EXAM: CT HEAD WITHOUT CONTRAST  TECHNIQUE: Contiguous axial images were obtained from the base of the skull through the vertex without intravenous contrast.  COMPARISON:  None.  FINDINGS: Mildly displaced bilateral nasal bone fractures are seen. These are of uncertain chronicity. The visualized paranasal sinuses are unremarkable. The ventricles are within normal limits. No findings to suggest acute hemorrhage, acute infarction or space-occupying mass lesion are noted.  IMPRESSION: Bilateral nasal bone fractures of uncertain chronicity.  No acute intracranial abnormality is noted.   Electronically Signed   By: Alcide CleverMark  Lukens M.D.   On: 02/12/2014 07:36    EKG: Independently reviewed.   Assessment/Plan Principal Problem:   Seizure Active Problems:   Acute encephalopathy   Postictal psychosis   Benzodiazepine withdrawal   Hypertension   Concussion   1. Seizures. Patient noted to have generalized tonic-clonic seizures this morning at 3 AM associated with postictal confusion. Seizures could be related to history of head trauma as he fell backwards from an escalator last Monday. Other possibilities include benzodiazepine withdrawal as his wife took away his Xanax last Wednesday. CT scan of the brain showing no acute intracranial abnormality. I discussed case with Dr. Leroy Kennedyamilo of  neurology for consultation. Will obtain an EEG and restart benzodiazepine therapy with Ativan 0.5 mg by mouth twice a day. Await further recommendations from neurology regarding antiepileptic therapy.  2. Acute encephalopathy. Likely secondary to post ictal state. Improving in the emergency room. 3. Suspected benzodiazepine withdrawal. Patient reporting taking 1 mg of Xanax at nighttime as needed for insomnia, stating he had been taking it 3-5 times/week however I suspect he may have been taking this medication on a daily basis. Xanax was stopped by his wife last Wednesday which could have lead to a withdraw. Starting Ativan 0.5 mg PO BID.  4. Hypertension. Start Norvasc 5 mg PO q daily 5. DVT prophylaxis. Lovenox   Code Status: Full Code Family Communication: Spoke with patient's wife at bedside Disposition Plan: Will place in overnight observation, anticipate will not require greater than 2 nights hospitalization.   Time spent: 55 min  Jeralyn BennettEzequiel Mazell [place]worth [facility] Pager [phone]

## 2014-02-12 NOTE — ED Provider Notes (Addendum)
CSN: [id number]     Arrival date & time 02/12/14  0449 History   First MD Initiated Contact with Patient 02/12/14 0500     Chief Complaint  Patient presents with  . Altered Mental Status     (Consider location/radiation/quality/duration/timing/severity/associated sxs/prior Treatment) HPI 48 yo male presents to the ER from home via EMS with complaint of altered mental status.  Pt remembers going to bed last night around 10 pm, and then the next thing he knew a police officer was trying to get him to go back to his house.  Pt had accident at work on Monday, fell 12 feet onto feet, then fell back striking his head.  [facility], dx with concussion.  He was admitted to medicine for observation for bradycardia.  Negative CT scan of head at that time, cleared by trauma service.  Pt reports he has been home from work this week.  Denies any problems.  No headaches, confusion.  Pt has h/o HTN.  Pt is slightly slow with answering questions.  Talked with pt's wife over the phone.  She agrees with patient's history up to bed at 10 pm.  She reports she was awoken with the patient moving in the bed, she thought he might be having a seizure.  No prior h/o seizure.  Pt then got up, and had strange and garbled speech.  Pt was verbally hostile and abusive to her and their daughter.  He then left the house.  EMS and police unable to find patient for about 1.5 hours, at which time pt was nearly back to baseline.  Pt has history of xanax prescription on his medication list.  Pt's wife reports she took the bottle away this week after pt was d/c home on Tuesday.  She reports pt has h/o alcohol abuse, and she was concerned about abuse.  She reports occasionally in the past months she has noticed that patient has slurred speech and seems drunk, but has not been drinking.    Past Medical History  Diagnosis Date  . Hypertension   . Kidney stones   . Plantar fasciitis   . Prediabetes   . DJD (degenerative joint  disease) of knee     Right   Past Surgical History  Procedure Laterality Date  . Knee arthroscopy Right 1987   No family history on file. History  Substance Use Topics  . Smoking status: Former Smoker    Quit date: 07/22/2002  . Smokeless tobacco: Not on file  . Alcohol Use: No    Review of Systems  All other systems reviewed and are negative. other than listed in HPI    Allergies  Penicillins  Home Medications   Prior to Admission medications   Medication Sig Start Date End Date Taking? Authorizing Provider  acetaminophen (TYLENOL) 325 MG tablet Take 650 mg by mouth every 6 (six) hours as needed.    Historical Provider, MD  ALPRAZolam Prudy Feeler(XANAX) 1 MG tablet TAKE 1 TABLET 3 TIMES A DAY AS NEEDED    Quentin MullingAmanda Collier, PA-C  bisoprolol-hydrochlorothiazide (ZIAC) 5-6.25 MG per tablet Take 1 tablet by mouth daily.    Historical Provider, MD  Cholecalciferol (VITAMIN D PO) Take 8,000 Int'l Units by mouth daily.    Historical Provider, MD  cyclobenzaprine (FLEXERIL) 10 MG tablet Take 1 tablet (10 mg total) by mouth 3 (three) times daily as needed for muscle spasms. 12/11/13 12/11/14  Melissa R Smith, PA-C  fenofibrate micronized (LOFIBRA) 134 MG capsule Take 1  capsule (134 mg total) by mouth daily before breakfast. 09/24/13   Quentin MullingAmanda Collier, PA-C  HYDROcodone-acetaminophen (NORCO) 5-325 MG per tablet Take 1 tablet by mouth every 6 (six) hours as needed for moderate pain. 12/11/13   Berenice PrimasMelissa R Smith, PA-C  Omega-3 Fatty Acids (FISH OIL PO) Take 1,000 mg by mouth daily.    Historical Provider, MD  predniSONE (DELTASONE) 10 MG tablet 1 po TID x 3 days, 1 PO BID x 3 days, 1 po QD x 5 days 12/11/13   Melissa R Smith, PA-C   BP 138/84  Pulse 77  Temp(Src) 98.3 F (36.8 C) (Oral)  Resp 16  Ht 6' (1.829 m)  Wt 235 lb (106.595 kg)  BMI 31.86 kg/m2  SpO2 95% Physical Exam  Nursing note and vitals reviewed. Constitutional: He is oriented to person, place, and time. He appears well-developed and  well-nourished.  HENT:  Head: Normocephalic and atraumatic.  Right Ear: External ear normal.  Left Ear: External ear normal.  Nose: Nose normal.  Mouth/Throat: Oropharynx is clear and moist.  Abrasion/indentation to middle of tongue, no bleeding noted  Eyes: Conjunctivae and EOM are normal. Pupils are equal, round, and reactive to light.  Neck: Normal range of motion. Neck supple. No JVD present. No tracheal deviation present. No thyromegaly present.  Cardiovascular: Normal rate, regular rhythm, normal heart sounds and intact distal pulses.  Exam reveals no gallop and no friction rub.   No murmur heard. Pulmonary/Chest: Effort normal and breath sounds normal. No stridor. No respiratory distress. He has no wheezes. He has no rales. He exhibits no tenderness.  Abdominal: Soft. Bowel sounds are normal. He exhibits no distension and no mass. There is no tenderness. There is no rebound and no guarding.  Musculoskeletal: Normal range of motion. He exhibits no edema and no tenderness.  Lymphadenopathy:    He has no cervical adenopathy.  Neurological: He is alert and oriented to person, place, and time. He has normal reflexes. No cranial nerve deficit. He exhibits normal muscle tone. Coordination normal.  Slowed answers to questions  Skin: Skin is warm and dry. No rash noted. No erythema. No pallor.  Psychiatric: He has a normal mood and affect. His behavior is normal. Judgment normal.    ED Course  Procedures (including critical care time) Labs Review Labs Reviewed  CBC WITH DIFFERENTIAL  BASIC METABOLIC PANEL  URINALYSIS, ROUTINE W REFLEX MICROSCOPIC  URINE RAPID DRUG SCREEN (HOSP PERFORMED)  ETHANOL    Imaging Review No results found.   EKG Interpretation None      MDM   Final diagnoses:  None    48 yo male with ALOC tonight, possible seizure.  Recent head injury/concussion, and also recent abrupt cessation of benzodiazepine use.  D/w Dr Thad Rangereynolds with neurology who  recommends starting with repeat of head CT.  Will get baseline labs, drug screen, etoh.   Olivia Mackielga M Aubriel Khanna, MD 02/12/14 [id number]  Olivia Mackielga M Merilynn Haydu, MD 02/12/14 [phone]

## 2014-02-12 NOTE — ED Notes (Signed)
Admitting physician at bedside

## 2014-02-12 NOTE — Consult Note (Signed)
NEURO HOSPITALIST CONSULT NOTE    Reason for Consult: seizure  HPI:                                                                                                                                          Spencer Humphrey is an 48 y.o. male with a past medical history significant for HTN, kidney stones, ETOH abuse with alcohol related seizures years ago, chronic use of xanax, s/p fall few 3 days ago with mild close head trauma (fell 12 feet onto feet, then fell back striking his head) requiring overnight stay for observation at [facility], brought in after sustaining a witnessed seizure at home earlier today.  He said that he has no recollection of the seizure. Wife indicated that the seizure occurred while he was sleep, he was violently moving in bed for approximately 2 minutes and afterwards he was agitated, confused, and amnestic for the event. His wife tells me that he went away from the house and EMS and police were unable to find patient for about 1.5 hours, at which time pt was nearly back to baseline.  It is worth noting that Spencer Humphrey has a history of chronic use of Xanax and his wife reports that she took the bottle away this week after pt was d/c home on Tuesday.  He reports HA, intermittent dizziness, short term memory changes,and confusion performing certain familiar task which developed right after the fall. CT brain today showed is unremarkable except for bilateral nasal bone fractures of uncertain chronicity. Drug screen panel negative. WBC normal. Metabolic panel with mild hypokalemia. Denies double vision, focal weakness or numbness, imbalance, slurred speech, language or vision impairment. No recent fever or infection.  No history of febrile seizures, CNS infections, severe head trauma, or stroke. No family history of epilepsy. He was born full tem, product of a normal pregnancy and delivery. No neonatal/perinatal complications. Normal  development.  Past Medical History  Diagnosis Date  . Hypertension   . Kidney stones   . Plantar fasciitis   . Prediabetes   . DJD (degenerative joint disease) of knee     Right    Past Surgical History  Procedure Laterality Date  . Knee arthroscopy Right 1987    No family history on file.   Social History:  reports that he quit smoking about 11 years ago. He does not have any smokeless tobacco history on file. He reports that he does not drink alcohol or use illicit drugs.  Allergies  Allergen Reactions  . Penicillins Other (See Comments)    Unknown    MEDICATIONS:  I have reviewed the patient's current medications.   ROS:                                                                                                                                       History obtained from the patient, wife, and chart review.  General ROS: negative for - chills, fatigue, fever, night sweats, or weight loss Psychological ROS: negative for - behavioral disorder, hallucinations, mood swings or suicidal ideation Ophthalmic ROS: negative for - blurry vision, double vision, eye pain or loss of vision ENT ROS: negative for - epistaxis, nasal discharge, oral lesions, sore throat, tinnitus or vertigo Allergy and Immunology ROS: negative for - hives or itchy/watery eyes Hematological and Lymphatic ROS: negative for - bleeding problems, bruising or swollen lymph nodes Endocrine ROS: negative for - galactorrhea, hair pattern changes, polydipsia/polyuria or temperature intolerance Respiratory ROS: negative for - cough, hemoptysis, shortness of breath or wheezing Cardiovascular ROS: negative for - chest pain, dyspnea on exertion, edema or irregular heartbeat Gastrointestinal ROS: negative for - abdominal pain, diarrhea, hematemesis, nausea/vomiting or stool incontinence Genito-Urinary  ROS: negative for - dysuria, hematuria, incontinence or urinary frequency/urgency Musculoskeletal ROS: negative for - joint swelling or muscular weakness. Soreness upper body Neurological ROS: as noted in HPI Dermatological ROS: negative for rash and skin lesion changes  Physical exam: pleasant male in no apparent distress. Blood pressure 148/86, pulse 82, temperature 98.3 F (36.8 C), temperature source Oral, resp. rate 25, height 6' (1.829 m), weight 106.595 kg (235 lb), SpO2 97.00%. Head: normocephalic. Neck: supple, no bruits, no JVD. Cardiac: no murmurs. Lungs: clear. Abdomen: soft, no tender, no mass. Extremities: no edema. CV: pulses palpable throughout  Neurologic Examination:                                                                                                      Mental Status: Alert, oriented, thought content appropriate.  Speech fluent without evidence of aphasia.  Able to follow 3 step commands without difficulty. Cranial Nerves: II: Discs flat bilaterally; Visual fields grossly normal, pupils equal, round, reactive to light and accommodation III,IV, VI: ptosis not present, extra-ocular motions intact bilaterally V,VII: smile symmetric, facial light touch sensation normal bilaterally VIII: hearing normal bilaterally IX,X: gag reflex present XI: bilateral shoulder shrug XII: midline tongue extension without atrophy or fasciculations Motor: Right : Upper extremity   5/5    Left:     Upper extremity   5/5  Lower extremity   5/5  Lower extremity   5/5 Tone and bulk:normal tone throughout; no atrophy noted Sensory: Pinprick and light touch intact throughout, bilaterally Deep Tendon Reflexes:  Right: Upper Extremity   Left: Upper extremity   biceps (C-5 to C-6) 2/4   biceps (C-5 to C-6) 2/4 tricep (C7) 2/4    triceps (C7) 2/4 Brachioradialis (C6) 2/4  Brachioradialis (C6) 2/4  Lower Extremity Lower Extremity  quadriceps (L-2 to L-4) 2/4   quadriceps (L-2 to  L-4) 2/4 Achilles (S1) 2/4   Achilles (S1) 2/4 Plantars: Right: downgoing   Left: downgoing Cerebellar: normal finger-to-nose,  normal heel-to-shin test Gait:  No tested.    Lab Results  Component Value Date/Time   CHOL 183 09/23/2013  9:32 AM    Results for orders placed during the hospital encounter of 02/12/14 (from the past 48 hour(s))  CBC WITH DIFFERENTIAL     Status: Abnormal   Collection Time    02/12/14  6:48 AM      Result Value Ref Range   WBC 14.2 (*) 4.0 - 10.5 K/uL   RBC 4.69  4.22 - 5.81 MIL/uL   Hemoglobin 15.4  13.0 - 17.0 g/dL   HCT 42.5  39.0 - 52.0 %   MCV 90.6  78.0 - 100.0 fL   MCH 32.8  26.0 - 34.0 pg   MCHC 36.2 (*) 30.0 - 36.0 g/dL   RDW 13.1  11.5 - 15.5 %   Platelets 145 (*) 150 - 400 K/uL   Neutrophils Relative % 82 (*) 43 - 77 %   Neutro Abs 11.6 (*) 1.7 - 7.7 K/uL   Lymphocytes Relative 9 (*) 12 - 46 %   Lymphs Abs 1.3  0.7 - 4.0 K/uL   Monocytes Relative 9  3 - 12 %   Monocytes Absolute 1.3 (*) 0.1 - 1.0 K/uL   Eosinophils Relative 0  0 - 5 %   Eosinophils Absolute 0.0  0.0 - 0.7 K/uL   Basophils Relative 0  0 - 1 %   Basophils Absolute 0.0  0.0 - 0.1 K/uL  BASIC METABOLIC PANEL     Status: Abnormal   Collection Time    02/12/14  6:48 AM      Result Value Ref Range   Sodium 141  137 - 147 mEq/L   Potassium 3.4 (*) 3.7 - 5.3 mEq/L   Chloride 102  96 - 112 mEq/L   CO2 23  19 - 32 mEq/L   Glucose, Bld 93  70 - 99 mg/dL   BUN 19  6 - 23 mg/dL   Creatinine, Ser 1.17  0.50 - 1.35 mg/dL   Calcium 9.6  8.4 - 10.5 mg/dL   GFR calc non Af Amer 72 (*) >90 mL/min   GFR calc Af Amer 84 (*) >90 mL/min   Comment: (NOTE)     The eGFR has been calculated using the CKD EPI equation.     This calculation has not been validated in all clinical situations.     eGFR's persistently <90 mL/min signify possible Chronic Kidney     Disease.  ETHANOL     Status: None   Collection Time    02/12/14  6:48 AM      Result Value Ref Range   Alcohol, Ethyl  (B) <11  0 - 11 mg/dL   Comment:            LOWEST DETECTABLE LIMIT FOR     SERUM ALCOHOL IS 11 mg/dL  FOR MEDICAL PURPOSES ONLY  URINALYSIS, ROUTINE W REFLEX MICROSCOPIC     Status: Abnormal   Collection Time    02/12/14  7:37 AM      Result Value Ref Range   Color, Urine YELLOW  YELLOW   APPearance CLEAR  CLEAR   Specific Gravity, Urine 1.021  1.005 - 1.030   pH 5.0  5.0 - 8.0   Glucose, UA NEGATIVE  NEGATIVE mg/dL   Hgb urine dipstick NEGATIVE  NEGATIVE   Bilirubin Urine NEGATIVE  NEGATIVE   Ketones, ur 15 (*) NEGATIVE mg/dL   Protein, ur NEGATIVE  NEGATIVE mg/dL   Urobilinogen, UA 0.2  0.0 - 1.0 mg/dL   Nitrite NEGATIVE  NEGATIVE   Leukocytes, UA NEGATIVE  NEGATIVE   Comment: MICROSCOPIC NOT DONE ON URINES WITH NEGATIVE PROTEIN, BLOOD, LEUKOCYTES, NITRITE, OR GLUCOSE <1000 mg/dL.  URINE RAPID DRUG SCREEN (HOSP PERFORMED)     Status: None   Collection Time    02/12/14  7:37 AM      Result Value Ref Range   Opiates NONE DETECTED  NONE DETECTED   Cocaine NONE DETECTED  NONE DETECTED   Benzodiazepines NONE DETECTED  NONE DETECTED   Amphetamines NONE DETECTED  NONE DETECTED   Tetrahydrocannabinol NONE DETECTED  NONE DETECTED   Barbiturates NONE DETECTED  NONE DETECTED   Comment:            DRUG SCREEN FOR MEDICAL PURPOSES     ONLY.  IF CONFIRMATION IS NEEDED     FOR ANY PURPOSE, NOTIFY LAB     WITHIN 5 DAYS.                LOWEST DETECTABLE LIMITS     FOR URINE DRUG SCREEN     Drug Class       Cutoff (ng/mL)     Amphetamine      1000     Barbiturate      200     Benzodiazepine   557     Tricyclics       322     Opiates          300     Cocaine          300     THC              50    Ct Head Wo Contrast  02/12/2014   CLINICAL DATA:  Recent traumatic injury  EXAM: CT HEAD WITHOUT CONTRAST  TECHNIQUE: Contiguous axial images were obtained from the base of the skull through the vertex without intravenous contrast.  COMPARISON:  None.  FINDINGS: Mildly displaced  bilateral nasal bone fractures are seen. These are of uncertain chronicity. The visualized paranasal sinuses are unremarkable. The ventricles are within normal limits. No findings to suggest acute hemorrhage, acute infarction or space-occupying mass lesion are noted.  IMPRESSION: Bilateral nasal bone fractures of uncertain chronicity.  No acute intracranial abnormality is noted.   Electronically Signed   By: Inez Catalina M.D.   On: 02/12/2014 07:36   Assessment/Plan: 48 y/o with history of ETOH abuse complicated by alcohol related seizures years ago, chronic xanax use abruptly discontinue couple of days ago, s/p mild closed head trauma 3 days ago, brought in after sustaining a seizure earlier today. Drug screen, serologies, and CT brain are unremarkable. Patient did not have a significant head trauma and thus doubt early post traumatic seizures. Consider provoked seizure in the context of abruptly stopping xanax. However,  it is prudent to complete seizure work up with MRI brain and EEG. Will defer AED pending testing results. Will follow up.  Dorian Pod, MD 02/12/2014, 9:48 AM

## 2014-02-12 NOTE — ED Notes (Signed)
EMS called to house. Pt. Had been outside wondering for 1.5 hrs. Pt. Was in accident at work this past Monday when he feel landing on his feet then feel over and hit his head. [facility] to [facility] and treated for concussion. He woke wife up tonight having some type of jerking motion. He was uncharacteristically mean afterwards than went outside and walked in words.Sent here for evaluation.

## 2014-02-12 NOTE — ED Notes (Signed)
Attempted report 

## 2014-02-13 ENCOUNTER — Observation Stay (HOSPITAL_COMMUNITY): Payer: Worker's Compensation

## 2014-02-13 ENCOUNTER — Encounter (HOSPITAL_COMMUNITY): Payer: Self-pay | Admitting: Radiology

## 2014-02-13 DIAGNOSIS — F1520 Other stimulant dependence, uncomplicated: Secondary | ICD-10-CM

## 2014-02-13 DIAGNOSIS — F19939 Other psychoactive substance use, unspecified with withdrawal, unspecified: Secondary | ICD-10-CM

## 2014-02-13 DIAGNOSIS — N200 Calculus of kidney: Secondary | ICD-10-CM

## 2014-02-13 DIAGNOSIS — G9340 Encephalopathy, unspecified: Secondary | ICD-10-CM

## 2014-02-13 LAB — TROPONIN I
Troponin I: <0.3 ng/mL (ref <0.30)
Troponin I: <0.3 ng/mL (ref <0.30)
Troponin I: <0.3 ng/mL (ref <0.30)
Troponin I: <0.3 ng/mL (ref <0.30)

## 2014-02-13 LAB — BASIC METABOLIC PANEL
BUN: 16 mg/dL (ref 6–23)
CO2: 24 mEq/L (ref 19–32)
Calcium: 8.9 mg/dL (ref 8.4–10.5)
Chloride: 103 mEq/L (ref 96–112)
Creatinine, Ser: 1.25 mg/dL (ref 0.50–1.35)
GFR calc Af Amer: 77 mL/min — ABNORMAL LOW (ref >90)
GFR calc non Af Amer: 67 mL/min — ABNORMAL LOW (ref >90)
Glucose, Bld: 104 mg/dL — ABNORMAL HIGH (ref 70–99)
Potassium: 3.3 mEq/L — ABNORMAL LOW (ref 3.7–5.3)
Sodium: 142 mEq/L (ref 137–147)

## 2014-02-13 LAB — DRUGS OF ABUSE SCREEN W/O ALC, ROUTINE URINE
Amphetamine Screen, Ur: NEGATIVE
Barbiturate Quant, Ur: NEGATIVE
Benzodiazepines.: POSITIVE — AB
COCAINE METABOLITES: NEGATIVE
Creatinine,U: 186.2 mg/dL
METHADONE: NEGATIVE
Marijuana Metabolite: NEGATIVE
OPIATE SCREEN, URINE: NEGATIVE
PROPOXYPHENE: NEGATIVE
Phencyclidine (PCP): NEGATIVE

## 2014-02-13 LAB — CBC
HCT: 43.3 % (ref 39.0–52.0)
HEMOGLOBIN: 14.6 g/dL (ref 13.0–17.0)
MCH: 31.5 pg (ref 26.0–34.0)
MCHC: 33.7 g/dL (ref 30.0–36.0)
MCV: 93.3 fL (ref 78.0–100.0)
Platelets: 131 10*3/uL — ABNORMAL LOW (ref 150–400)
RBC: 4.64 MIL/uL (ref 4.22–5.81)
RDW: 13.5 % (ref 11.5–15.5)
WBC: 8.4 10*3/uL (ref 4.0–10.5)

## 2014-02-13 LAB — MAGNESIUM: MAGNESIUM: 2.4 mg/dL (ref 1.5–2.5)

## 2014-02-13 MED ORDER — POTASSIUM CHLORIDE CRYS ER 20 MEQ PO TBCR
40.0000 meq | EXTENDED_RELEASE_TABLET | Freq: Once | ORAL | Status: AC
  Administered 2014-02-13: 40 meq via ORAL
  Filled 2014-02-13: qty 2

## 2014-02-13 NOTE — Progress Notes (Signed)
Pt complains of "chest pain" but when asked to describe pt points to right upper quadrant and states "feels like kidney stone pain but in the front." denies pain on left side or radiation to jaw. VS stable. Will continue to monitor.

## 2014-02-13 NOTE — Progress Notes (Signed)
NEURO HOSPITALIST PROGRESS NOTE   SUBJECTIVE:                                                                                                                        Uneventful night. Said that his whole body hurts. EEG: photoparoxysmal response followed by a GTC seizure. On keppra XR 1,500 mg daily without reported side effects so far. MRI brain unremarkable.  OBJECTIVE:                                                                                                                           Vital signs in last 24 hours: Temp:  [97.2 F (36.2 C)-99.4 F (37.4 C)] 98.4 F (36.9 C) (04/25 0518) Pulse Rate:  [66-100] 74 (04/25 0518) Resp:  [17-22] 17 (04/25 0518) BP: (114-150)/(61-96) 134/82 mmHg (04/25 0518) SpO2:  [95 %-100 %] 100 % (04/25 0518)  Intake/Output from previous day:   Intake/Output this shift:   Nutritional status: Carb Control  Past Medical History  Diagnosis Date  . Hypertension   . Kidney stones   . Plantar fasciitis   . Prediabetes   . DJD (degenerative joint disease) of knee     Right    Neurologic Exam:  Mental Status:  Alert, oriented, thought content appropriate. Speech fluent without evidence of aphasia. Able to follow 3 step commands without difficulty.  Cranial Nerves:  II: Discs flat bilaterally; Visual fields grossly normal, pupils equal, round, reactive to light and accommodation  III,IV, VI: ptosis not present, extra-ocular motions intact bilaterally  V,VII: smile symmetric, facial light touch sensation normal bilaterally  VIII: hearing normal bilaterally  IX,X: gag reflex present  XI: bilateral shoulder shrug  XII: midline tongue extension without atrophy or fasciculations  Motor:  Right : Upper extremity 5/5 Left: Upper extremity 5/5  Lower extremity 5/5 Lower extremity 5/5  Tone and bulk:normal tone throughout; no atrophy noted  Sensory: Pinprick and light touch intact throughout, bilaterally   Deep Tendon Reflexes:  Right: Upper Extremity Left: Upper extremity  biceps (C-5 to C-6) 2/4 biceps (C-5 to C-6) 2/4  tricep (C7) 2/4 triceps (C7) 2/4  Brachioradialis (C6) 2/4 Brachioradialis (C6) 2/4  Lower Extremity Lower Extremity  quadriceps (L-2 to L-4) 2/4 quadriceps (L-2  to L-4) 2/4  Achilles (S1) 2/4 Achilles (S1) 2/4  Plantars:  Right: downgoing Left: downgoing  Cerebellar:  normal finger-to-nose, normal heel-to-shin test  Gait:  No tested.   Lab Results: Lab Results  Component Value Date/Time   CHOL 183 09/23/2013  9:32 AM   Lipid Panel No results found for this basename: CHOL, TRIG, HDL, CHOLHDL, VLDL, LDLCALC,  in the last 72 hours  Studies/Results: Ct Head Wo Contrast  02/12/2014   CLINICAL DATA:  Recent traumatic injury  EXAM: CT HEAD WITHOUT CONTRAST  TECHNIQUE: Contiguous axial images were obtained from the base of the skull through the vertex without intravenous contrast.  COMPARISON:  None.  FINDINGS: Mildly displaced bilateral nasal bone fractures are seen. These are of uncertain chronicity. The visualized paranasal sinuses are unremarkable. The ventricles are within normal limits. No findings to suggest acute hemorrhage, acute infarction or space-occupying mass lesion are noted.  IMPRESSION: Bilateral nasal bone fractures of uncertain chronicity.  No acute intracranial abnormality is noted.   Electronically Signed   By: Alcide CleverMark  Lukens M.D.   On: 02/12/2014 07:36   Mr Laqueta JeanBrain W UJWo Contrast  02/12/2014   CLINICAL DATA:  New onset seizure  EXAM: MRI HEAD WITHOUT AND WITH CONTRAST  TECHNIQUE: Multiplanar, multiecho pulse sequences of the brain and surrounding structures were obtained without and with intravenous contrast.  CONTRAST:  20mL MULTIHANCE GADOBENATE DIMEGLUMINE 529 MG/ML IV SOLN  COMPARISON:  Head CT same day  FINDINGS: Diffusion imaging does not show any acute or subacute infarction. The brainstem and cerebellum are normal. The cerebral hemispheres show  scattered foci of FLAIR and T2 signal within the white matter. This is nonspecific. They could represent an early manifestation of small vessel disease, migraine related foci, insults related to vasculitis or old head trauma. Mesial temporal lobes are normal. After contrast administration, no abnormal enhancement occurs. No skull or skullbase lesion. No pituitary mass. No inflammatory sinus disease.  IMPRESSION: No acute finding. No specific lesions seen to explain seizures. Scattered white matter foci within the cerebral hemispheres. Most commonly, this is due to an early manifestation of small vessel disease. The differential diagnosis includes migraine related foci, history of vasculitis and prior closed head injury.   Electronically Signed   By: Paulina FusiMark  Shogry M.D.   On: 02/12/2014 20:19    MEDICATIONS                                                                                                                       I have reviewed the patient's current medications.  ASSESSMENT/PLAN:  48 y/o with history of ETOH abuse complicated by alcohol related seizures years ago, chronic xanax use abruptly discontinued couple of days ago, s/p mild closed head trauma 4 days ago, brought in after sustaining a seizure at home. Had another seizure while having EEG. Drug screen, serologies, and CT/MRI brain are unremarkable but EEG showed a photoparoxysmal response outlasting the stimulus and then had a GTC seizure immediately after IPS, and thus this is unlikely to be a provoked benzo withdrawal seizure . Continue keppra. Needs outpatient neurology follow up in 2 weeks. Will sign off.  Wyatt Portela, MD [facility] [phone]  02/13/2014, 9:08 AM

## 2014-02-13 NOTE — Progress Notes (Signed)
UR Completed.  Spencer Humphrey [phone] 02/13/2014  

## 2014-02-13 NOTE — Progress Notes (Signed)
[facility]                                                                              Patient Demographics  Spencer Humphrey, is a 48 y.o. male, DOB - [birth year], WGN:[id number]  Admit date - 02/12/2014   Admitting Physician Jeralyn BennettEzequiel Zamora, MD  Outpatient Primary MD for the patient is Nadean CorwinMCKEOWN,WILLIAM DAVID, MD  LOS - 1   Chief Complaint  Patient presents with  . Altered Mental Status      HPI: Spencer Humphrey is a 48 y.o. male with a past medical history of hypertension, diet-controlled diabetes mellitus, history of alcohol related seizures approximately 12 years ago, who had a fall from an excavator last Monday at his work site. Patient falling approximately 10 feet from his escalator collecting on his back and head. [facility]. Patient was kept overnight for observation and discharged the following day in stable condition. Patient Was noted by his wife to have a generalized tonic-clonic seizure this morning at approximately 3 AM which lasted several minutes. After this event his wife described him as having a "blank stare" confused, disoriented, did not recognize her or her daughter, and proceeded to walk outside in the neighborhood. His wife called the police who found wandering the streets and brought back home. Confusion gradually resolved, presently awake alert and oriented during my encounter in the emergency room. Patient had been taking Xanax at nighttime for sleep which was unknown to his wife until last Wednesday. She took away his Xanax on Wednesday as she was concerned that this was causing sedation and confusion. He denies recreational drug or alcohol abuse and is presently not on antiepileptic medications. CT scan of performed in the emergency room didn't reveal acute intracranial abnormalities. Admitting physician spoke with Dr. Leroy Kennedyamilo of Neurology from the emergency room.    Assessment & Plan   Seizure -CT head: No acute  intracranial abnormality -MRI brain: No acute finding. -Neurology consulted and appreciated, does not believe this seizure to be related to benzo withdrawal, recommended Keppra with outpatient neurology follow up   -EEG: photoparoxysmal response followed by a GTC seizure. -Continue Keppra and Ativan  Acute Encephalopathy -Resolved likely secondary to seizure  Hypertension -Stable, Continue amlodipine  Hypokalemia -Will continue to replace and monitor BMP -Wil obtain Magnesium level  Elevated troponin -Currently chest pain free -Will continue to cycle cardiac enzymes -Troponins <0.3, 0.96, followed by <0.3 -Possibly due to seizure activity  Generalized pain -Continue PRN pain control -May also be due to nonobstructing renal calculi seen on CT abd/pelvis  Leukocytosis -Resolved -Likely acute phase reactant -UA negative, lung exam clear, afebrile  Code Status: Full  Family Communication: Wife at bedside.  Disposition Plan: Admitted  Time Spent in minutes   30 minutes  Procedures  EEG Impression: this is an abnormal EEG because of the occurrence of a generalized photoparoxysmal response triggering a clinical event as described above. 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   Consults   Neurology  DVT Prophylaxis  Lovenox  Lab Results  Component Value Date   PLT 131* 02/13/2014  Medications  Scheduled Meds: . amLODipine  5 mg Oral Daily  . docusate sodium  100 mg Oral BID  . enoxaparin (LOVENOX) injection  50 mg Subcutaneous Q24H  . levETIRAcetam  1,500 mg Oral Daily  . LORazepam  0.5 mg Oral BID   Continuous Infusions: . sodium chloride 75 mL/hr at 02/12/14 1237   PRN Meds:.acetaminophen, acetaminophen, alum & mag hydroxide-simeth, LORazepam, magic mouthwash w/lidocaine, ondansetron (ZOFRAN) IV, ondansetron, oxyCODONE  Antibiotics    Anti-infectives   None        Subjective:   Spencer Humphrey  seen and examined today.  Patient complains of generalized pain.  He denies chest pain or shortness of breath.  Currently resting comfortably with wife at bedside.  Objective:   Filed Vitals:   02/13/14 0022 02/13/14 0219 02/13/14 0221 02/13/14 0518  BP: 114/78 142/96 123/61 134/82  Pulse: 90   74  Temp: 98 F (36.7 C)   98.4 F (36.9 C)  TempSrc: Oral   Oral  Resp: 18   17  Height:      Weight:      SpO2: 98%   100%    Wt Readings from Last 3 Encounters:  02/12/14 106.595 kg (235 lb)  12/11/13 113.399 kg (250 lb)  09/23/13 110.224 kg (243 lb)    No intake or output data in the 24 hours ending 02/13/14 0907  Exam  General: Well developed, well nourished, NAD, appears stated age  HEENT: NCAT, PERRLA, EOMI, Anicteic Sclera, mucous membranes moist.  Neck: Supple, no JVD, no masses  Cardiovascular: S1 S2 auscultated, no rubs, murmurs or gallops. Regular rate and rhythm.  Respiratory: Clear to auscultation bilaterally with equal chest rise  Abdomen: Soft, obese, nontender, nondistended, + bowel sounds  Extremities: warm dry without cyanosis clubbing or edema  Neuro: AAOx3, cranial nerves grossly intact. Strength equal and bilateral in upper/lower ext  Skin: Without rashes exudates or nodules  Psych: Normal affect and demeanor with intact judgement and insight  Data Review   Micro Results No results found for this or any previous visit (from the past 240 hour(s)).  Radiology Reports Ct Head Wo Contrast  02/12/2014   CLINICAL DATA:  Recent traumatic injury  EXAM: CT HEAD WITHOUT CONTRAST  TECHNIQUE: Contiguous axial images were obtained from the base of the skull through the vertex without intravenous contrast.  COMPARISON:  None.  FINDINGS: Mildly displaced bilateral nasal bone fractures are seen. These are of uncertain chronicity. The visualized paranasal sinuses are unremarkable. The ventricles are within normal limits. No findings to suggest acute hemorrhage, acute  infarction or space-occupying mass lesion are noted.  IMPRESSION: Bilateral nasal bone fractures of uncertain chronicity.  No acute intracranial abnormality is noted.   Electronically Signed   By: Alcide Clever M.D.   On: 02/12/2014 07:36   Mr Laqueta Jean WU Contrast  02/12/2014   CLINICAL DATA:  New onset seizure  EXAM: MRI HEAD WITHOUT AND WITH CONTRAST  TECHNIQUE: Multiplanar, multiecho pulse sequences of the brain and surrounding structures were obtained without and with intravenous contrast.  CONTRAST:  20mL MULTIHANCE GADOBENATE DIMEGLUMINE 529 MG/ML IV SOLN  COMPARISON:  Head CT same day  FINDINGS: Diffusion imaging does not show any acute or subacute infarction. The brainstem and cerebellum are normal. The cerebral hemispheres show scattered foci of FLAIR and T2 signal within the white matter. This is nonspecific. They could represent an early manifestation of small vessel disease, migraine related foci, insults related to vasculitis or old head trauma.  Mesial temporal lobes are normal. After contrast administration, no abnormal enhancement occurs. No skull or skullbase lesion. No pituitary mass. No inflammatory sinus disease.  IMPRESSION: No acute finding. No specific lesions seen to explain seizures. Scattered white matter foci within the cerebral hemispheres. Most commonly, this is due to an early manifestation of small vessel disease. The differential diagnosis includes migraine related foci, history of vasculitis and prior closed head injury.   Electronically Signed   By: Paulina FusiMark  Shogry M.D.   On: 02/12/2014 20:19    CBC  Recent Labs Lab 02/12/14 0648 02/13/14 0556  WBC 14.2* 8.4  HGB 15.4 14.6  HCT 42.5 43.3  PLT 145* 131*  MCV 90.6 93.3  MCH 32.8 31.5  MCHC 36.2* 33.7  RDW 13.1 13.5  LYMPHSABS 1.3  --   MONOABS 1.3*  --   EOSABS 0.0  --   BASOSABS 0.0  --     Chemistries   Recent Labs Lab 02/12/14 0648 02/13/14 0556  NA 141 142  K 3.4* 3.3*  CL 102 103  CO2 23 24  GLUCOSE  93 104*  BUN 19 16  CREATININE 1.17 1.25  CALCIUM 9.6 8.9   ------------------------------------------------------------------------------------------------------------------ estimated creatinine clearance is 91.2 ml/min (by C-G formula based on Cr of 1.25). ------------------------------------------------------------------------------------------------------------------ No results found for this basename: HGBA1C,  in the last 72 hours ------------------------------------------------------------------------------------------------------------------ No results found for this basename: CHOL, HDL, LDLCALC, TRIG, CHOLHDL, LDLDIRECT,  in the last 72 hours ------------------------------------------------------------------------------------------------------------------  Recent Labs  02/12/14 1331  TSH 2.000   ------------------------------------------------------------------------------------------------------------------ No results found for this basename: VITAMINB12, FOLATE, FERRITIN, TIBC, IRON, RETICCTPCT,  in the last 72 hours  Coagulation profile No results found for this basename: INR, PROTIME,  in the last 168 hours  No results found for this basename: DDIMER,  in the last 72 hours  Cardiac Enzymes  Recent Labs Lab 02/12/14 1404 02/12/14 2126 02/13/14 0556  TROPONINI <0.30 0.96* <0.30   ------------------------------------------------------------------------------------------------------------------ No components found with this basename: POCBNP,     Charlies Rayburn D.O. on 02/13/2014 at 9:07 AM  Between 7am to 7pm - Pager - [phone]  After 7pm go to [email or web address] - password [email or web address]  And look for the night coverage person covering for me after hours  [facility] Office  [phone]

## 2014-02-13 NOTE — Evaluation (Addendum)
Physical Therapy Evaluation Patient Details Name: Spencer Humphrey Pascarella MRN: [id number] DOB: May 13, 1966 Today's Date: 02/13/2014   History of Present Illness  Spencer Humphrey Ealey is a 48 y.o. male with a past medical history of hypertension, diet-controlled diabetes mellitus, history of alcohol related seizures approximately 12 years ago, who had a fall from an excavator last Monday at his work site.   Clinical Impression  Pt adm due to the above. Spoke with RN, pt not on bedrest at this time, ended 10am 4/25. Presents with decreased independence with mobility, has ataxic like movements, most likely due to pain and demo symptoms of mild TBI. Pt to benefit from skilled acute PT to address deficits indicated below and maximize functional mobility prior to returning home. Wife reports she will not be able to provide 24/7 (A) upon Humphrey/C. Pt needs to be mod I for mobility. C/o chest pain throughout session, limiting mobility, RN notified. Pt may require RW due to pain to increase stability.     Follow Up Recommendations No PT follow up;Supervision - Intermittent    Equipment Recommendations  Rolling walker with 5" wheels (pending progress with mobility )    Recommendations for Other Services       Precautions / Restrictions Precautions Precautions: Fall Precaution Comments: educated on TBI symptoms and signs; provided to pt and wife; ataxic like moevements Restrictions Weight Bearing Restrictions: No      Mobility  Bed Mobility Overal bed mobility: Modified Independent             General bed mobility comments: incr time due to pain; educated on splinting to reduce pain with movement   Transfers Overall transfer level: Needs assistance Equipment used: 1 person hand held assist Transfers: Sit to/from Stand Sit to Stand: Min assist         General transfer comment: required 2 attempts; ataxic like movements due to pain; min (A) to acheive and maintain balance with transfers; very unsteady  from toilet and relied heavily on handicap rail  Ambulation/Gait Ambulation/Gait assistance: Mod assist Ambulation Distance (Feet): 18 Feet (9 x2 ) Assistive device: 1 person hand held assist Gait Pattern/deviations: Step-through pattern;Shuffle;Ataxic;Wide base of support;Trunk flexed Gait velocity: decreased due to pain  Gait velocity interpretation: Below normal speed for age/gender General Gait Details: pt with ataxic like movements and flexes trunk unexpectantly due to pain; pt very unsteady and impulsive; requires mod (A) to maintain balance and required sitting rest break prior to reaching toilet due to pain; may benefit from RW to increase stability; max cues for safety with gt  Stairs            Wheelchair Mobility    Modified Rankin (Stroke Patients Only)       Balance Overall balance assessment: Needs assistance;History of Falls Sitting-balance support: Feet supported;No upper extremity supported Sitting balance-Leahy Scale: Fair Sitting balance - Comments: tolerated EOB ~3 min  Postural control: Posterior lean Standing balance support: During functional activity;Single extremity supported Standing balance-Leahy Scale: Poor Standing balance comment: unsteady and ataxic like movements                              Pertinent Vitals/Pain 10/10 "sharp shooting pain chest to abdomen" RN notified.     Home Living Family/patient expects to be discharged to:: Private residence Living Arrangements: Spouse/significant other Available Help at Discharge: Family;Available PRN/intermittently Type of Home: House Home Access: Stairs to enter Entrance Stairs-Rails: Right Entrance Stairs-Number of Steps:  3 Home Layout: One level Home Equipment: None      Prior Function Level of Independence: Independent               Hand Dominance   Dominant Hand: Right    Extremity/Trunk Assessment   Upper Extremity Assessment: Defer to OT evaluation            Lower Extremity Assessment: Generalized weakness (due to pain )      Cervical / Trunk Assessment: Normal  Communication   Communication: No difficulties  Cognition Arousal/Alertness: Awake/alert Behavior During Therapy: Impulsive;Flat affect Overall Cognitive Status: Impaired/Different from baseline Area of Impairment: Awareness;Memory;Safety/judgement;Attention   Current Attention Level: Selective Memory: Decreased short-term memory   Safety/Judgement: Decreased awareness of safety Awareness: Emergent   General Comments: pt very flat affect; wife reports increased short term memory loss since fall     General Comments General comments (skin integrity, edema, etc.): educated on splinting to reduce pain; TBI symptoms; wife present     Exercises        Assessment/Plan    PT Assessment Patient needs continued PT services  PT Diagnosis Difficulty walking;Acute pain   PT Problem List Decreased strength;Decreased activity tolerance;Decreased balance;Decreased mobility;Decreased cognition;Decreased knowledge of use of DME;Decreased safety awareness;Pain  PT Treatment Interventions DME instruction;Gait training;Stair training;Functional mobility training;Therapeutic activities;Therapeutic exercise;Balance training;Neuromuscular re-education;Patient/family education   PT Goals (Current goals can be found in the Care Plan section) Acute Rehab PT Goals Patient Stated Goal: to not have pain PT Goal Formulation: With patient Time For Goal Achievement: 02/20/14 Potential to Achieve Goals: Good    Frequency Min 4X/week   Barriers to discharge Decreased caregiver support wife will not be with him 24/7 ; needs to be mod i prior to Humphrey/c     Co-evaluation               End of Session Equipment Utilized During Treatment: Gait belt Activity Tolerance: Patient limited by pain Patient left: in bed;with bed alarm set;with family/visitor present Nurse Communication: Mobility  status;Other (comment) (c/o chest pain)    Functional Assessment Tool Used: clinical judgement  Functional Limitation: Mobility: Walking and moving around Mobility: Walking and Moving Around Current Status [phone]): At least 20 percent but less than 40 percent impaired, limited or restricted Mobility: Walking and Moving Around Goal Status [phone]([phone]): 0 percent impaired, limited or restricted    Time: 1257-1314 PT Time Calculation (min): 17 min   Charges:   PT Evaluation $Initial PT Evaluation Tier I: 1 Procedure PT Treatments $Gait Training: 8-22 mins   PT G Codes:   Functional Assessment Tool Used: clinical judgement  Functional Limitation: Mobility: Walking and moving around    RandBrittany N Chas Axel, South CarolinaPT [phone] 02/13/2014, 2:04 PM

## 2014-02-13 NOTE — Evaluation (Signed)
Occupational Therapy Evaluation Patient Details Name: Spencer Humphrey MRN: [id number] DOB: 1965/10/23 Today's Date: 02/13/2014    History of Present Illness Spencer Humphrey is a 48 y.o. male with a past medical history of hypertension, diet-controlled diabetes mellitus, history of alcohol related seizures approximately 12 years ago, who had a fall from an excavator last Monday at his work site.    Clinical Impression   Pt presents with below problem list. Pt independent with ADLs, PTA. Feel pt will benefit from acute OT to increase independence prior to d/c.     Follow Up Recommendations  Home health OT;Supervision/Assistance - 24 hour    Equipment Recommendations  Other (comment) (tbd)    Recommendations for Other Services       Precautions / Restrictions Precautions Precautions: Fall Restrictions Weight Bearing Restrictions: No      Mobility Bed Mobility Overal bed mobility: Needs Assistance Bed Mobility: Supine to Sit;Sit to Supine     Supine to sit: Supervision Sit to supine: Supervision   General bed mobility comments: Pt with pain in sternum area.  Transfers Overall transfer level: Needs assistance Equipment used: Rolling walker (2 wheeled);1 person hand held assist Transfers: Sit to/from Stand Sit to Stand: Min assist;Mod assist         General transfer comment: Pt with sudden jerky movements due to pain.     Balance                                            ADL Overall ADL's : Needs assistance/impaired     Grooming: Wash/dry face;Oral care;Moderate assistance;Standing   Upper Body Bathing: Moderate assistance;Standing       Upper Body Dressing : Moderate assistance;Sitting   Lower Body Dressing: Minimal assistance;Sit to/from stand;Moderate assistance   Toilet Transfer: Moderate assistance;Ambulation;BSC           Functional mobility during ADLs: Moderate assistance;Rolling walker General ADL Comments: Pt performed  grooming at sink and also UB bathing. Pt having pain that appeared like spasms during session and would suddenly jerk and hunch over-assistance for balance. Educated that hugging a pillow may would help.  Pt's friend present during session and told him that I recommended pt to sit for bathing and dressing.     Vision                     Perception     Praxis      Pertinent Vitals/Pain 10/10 pain in sternum/chest area. Pt using pillow to help during session. Repositioned at end of session.      Hand Dominance Right   Extremity/Trunk Assessment Upper Extremity Assessment Upper Extremity Assessment: RUE deficits/detail;LUE deficits/detail RUE Deficits / Details: c/o pain in sternum area but appeared to hurt worse when arms were lifted LUE Deficits / Details: c/o pain in sternum area but appeared to hurt worse when arms were lifted   Lower Extremity Assessment Lower Extremity Assessment: Defer to PT evaluation       Communication Communication Communication: No difficulties   Cognition Arousal/Alertness: Awake/alert Behavior During Therapy: WFL for tasks assessed/performed Overall Cognitive Status: Impaired/Different from baseline Area of Impairment: Safety/judgement         Safety/Judgement: Decreased awareness of safety         General Comments       Exercises       Shoulder Instructions  Home Living Family/patient expects to be discharged to:: Private residence Living Arrangements: Spouse/significant other Available Help at Discharge: Family;Available PRN/intermittently Type of Home: House Home Access: Stairs to enter Enter[employer] CorporationEntrance Stairs-Number of Steps: 3 Entrance Stairs-Rails: Right Home Layout: One level               Home Equipment: None          Prior Functioning/Environment Level of Independence: Independent             OT Diagnosis: Acute pain   OT Problem List: Pain;Decreased knowledge of precautions;Decreased knowledge  of use of DME or AE;Decreased cognition;Decreased safety awareness;Decreased activity tolerance;Impaired balance (sitting and/or standing);Decreased strength   OT Treatment/Interventions: Self-care/ADL training;DME and/or AE instruction;Therapeutic activities;Cognitive remediation/compensation;Patient/family education;Balance training    OT Goals(Current goals can be found in the care plan section) Acute Rehab OT Goals Patient Stated Goal: get out of hospital OT Goal Formulation: With patient Time For Goal Achievement: 02/20/14 Potential to Achieve Goals: Good ADL Goals Pt Will Perform Grooming: with modified independence;standing Pt Will Perform Upper Body Dressing: with modified independence;sitting Pt Will Perform Lower Body Dressing: with modified independence;sit to/from stand Pt Will Transfer to Toilet: with modified independence;ambulating Pt Will Perform Toileting - Clothing Manipulation and hygiene: with modified independence;sit to/from stand  OT Frequency: Min 2X/week   Barriers to D/C: Decreased caregiver support          Co-evaluation              End of Session Equipment Utilized During Treatment: Gait belt;Rolling walker  Activity Tolerance: Patient limited by pain Patient left: in bed;with call bell/phone within reach;with family/visitor present   Time: [id number] OT Time Calculation (min): 25 min Charges:  OT General Charges $OT Visit: 1 Procedure OT Evaluation $Initial OT Evaluation Tier I: 1 Procedure OT Treatments $Self Care/Home Management : 8-22 mins G-Codes: OT G-codes **NOT FOR INPATIENT CLASS** Functional Assessment Tool Used: clinical judgment Functional Limitation: Self care Self Care Current Status ([id number]): At least 40 percent but less than 60 percent impaired, limited or restricted Self Care Goal Status ([phone]([id number]): 0 percent impaired, limited or restricted  Earlie RavelingLindsey L Kaytlynne Neace OTR/L [phone] 02/13/2014, 6:38 PM

## 2014-02-14 DIAGNOSIS — E785 Hyperlipidemia, unspecified: Secondary | ICD-10-CM

## 2014-02-14 DIAGNOSIS — S060X9A Concussion with loss of consciousness of unspecified duration, initial encounter: Secondary | ICD-10-CM

## 2014-02-14 DIAGNOSIS — R569 Unspecified convulsions: Secondary | ICD-10-CM

## 2014-02-14 DIAGNOSIS — I10 Essential (primary) hypertension: Secondary | ICD-10-CM

## 2014-02-14 DIAGNOSIS — S060XAA Concussion with loss of consciousness status unknown, initial encounter: Secondary | ICD-10-CM

## 2014-02-14 LAB — BASIC METABOLIC PANEL
BUN: 14 mg/dL (ref 6–23)
CHLORIDE: 104 meq/L (ref 96–112)
CO2: 26 mEq/L (ref 19–32)
Calcium: 9 mg/dL (ref 8.4–10.5)
Creatinine, Ser: 1.2 mg/dL (ref 0.50–1.35)
GFR, EST AFRICAN AMERICAN: 81 mL/min — AB (ref >90)
GFR, EST NON AFRICAN AMERICAN: 70 mL/min — AB (ref >90)
Glucose, Bld: 85 mg/dL (ref 70–99)
Potassium: 3.7 mEq/L (ref 3.7–5.3)
SODIUM: 143 meq/L (ref 137–147)

## 2014-02-14 LAB — CBC
HCT: 40.8 % (ref 39.0–52.0)
Hemoglobin: 13.9 g/dL (ref 13.0–17.0)
MCH: 32 pg (ref 26.0–34.0)
MCHC: 34.1 g/dL (ref 30.0–36.0)
MCV: 93.8 fL (ref 78.0–100.0)
Platelets: 122 10*3/uL — ABNORMAL LOW (ref 150–400)
RBC: 4.35 MIL/uL (ref 4.22–5.81)
RDW: 13.5 % (ref 11.5–15.5)
WBC: 6.6 10*3/uL (ref 4.0–10.5)

## 2014-02-14 MED ORDER — LEVETIRACETAM ER 750 MG PO TB24: 1500.0000 mg | ORAL_TABLET | Freq: Every day | ORAL | Status: DC

## 2014-02-14 NOTE — Discharge Instructions (Signed)
Seizure, Adult A seizure is abnormal electrical activity in the brain. Seizures usually last from 30 seconds to 2 minutes. There are various types of seizures. Before a seizure, you may have a warning sensation (aura) that a seizure is about to occur. An aura may include the following symptoms:   Fear or anxiety.  Nausea.  Feeling like the room is spinning (vertigo).  Vision changes, such as seeing flashing lights or spots. Common symptoms during a seizure include:  A change in attention or behavior (altered mental status).  Convulsions with rhythmic jerking movements.  Drooling.  Rapid eye movements.  Grunting.  Loss of bladder and bowel control.  Bitter taste in the mouth.  Tongue biting. After a seizure, you may feel confused and sleepy. You may also have an injury resulting from convulsions during the seizure. HOME CARE INSTRUCTIONS   If you are given medicines, take them exactly as prescribed by your health care provider.  Keep all follow-up appointments as directed by your health care provider.  Do not swim or drive or engage in risky activity during which a seizure could cause further injury to you or others until your health care provider says it is OK.  Get adequate rest.  Teach friends and family what to do if you have a seizure. They should:  Lay you on the ground to prevent a fall.  Put a cushion under your head.  Loosen any tight clothing around your neck.  Turn you on your side. If vomiting occurs, this helps keep your airway clear.  Stay with you until you recover.  Know whether or not you need emergency care. SEEK IMMEDIATE MEDICAL CARE IF:  The seizure lasts longer than 5 minutes.  The seizure is severe or you do not wake up immediately after the seizure.  You have an altered mental status after the seizure.  You are having more frequent or worsening seizures. Someone should drive you to the emergency department or call local emergency  services (911 in [place] MAKE SURE YOU:  Understand these instructions.  Will watch your condition.  Will get help right away if you are not doing well or get worse. Document Released: 10/05/2000 Document Revised: 07/29/2013 Document Reviewed: 05/20/2013 [employer] Patient Information 2014 [employer].  

## 2014-02-14 NOTE — Progress Notes (Signed)
Physical Therapy Treatment Patient Details Name: Spencer Humphrey Gent MRN: [id number] DOB: [birth year] Today's Date: 02/14/2014    History of Present Illness Spencer Humphrey Cirelli is a 48 y.o. male with a past medical history of hypertension, diet-controlled diabetes mellitus, history of alcohol related seizures approximately 12 years ago, who had a fall from an excavator last Monday at his work site.     PT Comments    Safe to return home with wife's assist/supervision.  Gait much more steady today.  Follow Up Recommendations  No PT follow up;Supervision - Intermittent     Equipment Recommendations  Rolling walker with 5" wheels    Recommendations for Other Services       Precautions / Restrictions Precautions Precautions: Fall Precaution Comments: cautioned wife to watch hime Restrictions Weight Bearing Restrictions: No    Mobility  Bed Mobility Overal bed mobility: Modified Independent                Transfers Overall transfer level: Modified independent   Transfers: Sit to/from Stand Sit to Stand: Modified independent (Device/Increase time)         General transfer comment: almost non discernible unsteadiness  Ambulation/Gait Ambulation/Gait assistance: Supervision Ambulation Distance (Feet): 200 Feet Assistive device: None Gait Pattern/deviations: WFL(Within Functional Limits) Gait velocity: improving Gait velocity interpretation: Below normal speed for age/gender General Gait Details: Ataxia is gone,  Gait is a little stiff due to soreness, but generally steady and safe   Stairs Stairs: Yes Stairs assistance: Supervision Stair Management: One rail Right;Alternating pattern;Forwards Number of Stairs: 5    Wheelchair Mobility    Modified Rankin (Stroke Patients Only)       Balance Overall balance assessment: Needs assistance Sitting-balance support: Feet supported;No upper extremity supported Sitting balance-Leahy Scale: Good     Standing  balance support: During functional activity;No upper extremity supported Standing balance-Leahy Scale: Fair Standing balance comment: ataxia and unsteadiness has disappeared                    Cognition Arousal/Alertness: Awake/alert Behavior During Therapy: WFL for tasks assessed/performed Overall Cognitive Status: Within Functional Limits for tasks assessed                 General Comments: still flatter of affect, but wife reports better    Exercises      General Comments        Pertinent Vitals/Pain     Home Living                      Prior Function            PT Goals (current goals can now be found in the care plan section) Acute Rehab PT Goals Patient Stated Goal: get out of hospital Time For Goal Achievement: 02/20/14 Potential to Achieve Goals: Good Progress towards PT goals: Progressing toward goals    Frequency  Min 3X/week    PT Plan Current plan remains appropriate    Co-evaluation             End of Session   Activity Tolerance: Patient tolerated treatment well Patient left: in bed;with call bell/phone within reach     Time: 1200-1209 PT Time Calculation (min): 9 min  Charges:  $Gait Training: 8-22 mins                    G Codes:  Functional Assessment Tool Used: clinical judgement  Functional Limitation: Mobility: Walking and moving around  Mobility: Walking and Moving Around Current Status [phone](G8978): At least 20 percent but less than 40 percent impaired, limited or restricted Mobility: Walking and Moving Around Goal Status [phone](G8979): 0 percent impaired, limited or restricted Mobility: Walking and Moving Around Discharge Status [phone](G8980): 0 percent impaired, limited or restricted   Eliseo GumKenneth V Tehani Mersman 02/14/2014, 12:22 PM 02/14/2014  [place] BingKen Dawnita Molner, PT [phone]  (pager)

## 2014-02-14 NOTE — Discharge Summary (Signed)
Physician Discharge Summary  Spencer Humphrey ZOX:[id number] DOB: 05-Sep-1966 DOA: 02/12/2014  PCP: Spencer Corwin, MD  Admit date: 02/12/2014 Discharge date: 02/14/2014  Time spent: 45 minutes  Recommendations for Outpatient Follow-up:  Patient will be discharged to home.  He will need to followup with his primary care physician within one week of discharge. Patient will also need to followup with [facility] within 2 weeks of discharge. He should not drive, otherwise, instructed by his doctor. Patient to continue taking his medications as prescribed.  Discharge Diagnoses:  Seizure Acute encephalopathy Hypertension Hypokalemia Elevated troponin Generalized pain Leukocytosis  Discharge Condition: Stable  Diet recommendation: Carb Modified, heart healthy  Filed Weights   02/12/14 0507  Weight: 106.595 kg (235 lb)    History of present illness:  Spencer Humphrey is a 48 y.o. male with a past medical history of hypertension, diet-controlled diabetes mellitus, history of alcohol related seizures approximately 12 years ago, who had a fall from an excavator last Monday at his work site. Patient falling approximately 10 feet from his escalator collecting on his back and head. He was taken to [facility] for further evaluation. Patient was kept overnight for observation and discharged the following day in stable condition. Patient Was noted by his wife to have a generalized tonic-clonic seizure this morning at approximately 3 AM which lasted several minutes. After this event his wife described him as having a "blank stare" confused, disoriented, did not recognize her or her daughter, and proceeded to walk outside in the neighborhood. His wife called the police who found wandering the streets and brought back home. Confusion gradually resolved, presently awake alert and oriented during my encounter in the emergency room. Patient had been taking Xanax at nighttime  for sleep which was unknown to his wife until last Wednesday. She took away his Xanax on Wednesday as she was concerned that this was causing sedation and confusion. He denies recreational drug or alcohol abuse and is presently not on antiepileptic medications. CT scan of performed in the emergency room didn't reveal acute intracranial abnormalities.   Hospital Course:  Seizure  -CT head: No acute intracranial abnormality  -MRI brain: No acute finding.  -Neurology consulted and appreciated, does not believe this seizure to be related to benzo withdrawal, recommended Keppra with outpatient neurology follow up  -EEG: photoparoxysmal response followed by a GTC seizure.  -Was placed on Keppra and Ativan  -Patient will be discharged with Keppra and should follow up with [facility] in 2 weeks  Acute Encephalopathy  -Resolved likely secondary to seizure   Hypertension  -Stable, Continue amlodipine   Hypokalemia  -Resolved -magnesium 2.4  Elevated troponin  -Currently chest pain free  -Will continue to cycle cardiac enzymes  -Troponins <0.3, 0.96, followed by <0.3  -Possibly due to seizure activity   Generalized pain  -Continue PRN pain control  -May also be due to nonobstructing renal calculi seen on CT abd/pelvis   Leukocytosis  -Resolved  -Likely acute phase reactant  -UA negative, lung exam clear, afebrile   Procedures: EEG Impression: this is an abnormal EEG because of the occurrence of a generalized photoparoxysmal response triggering a clinical event as described above. 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   Consultations: Neurology   Discharge Exam: Filed Vitals:   02/14/14 1005  BP: 128/74  Pulse: 75  Temp: 98.1 F (36.7 C)  Resp: 20   Exam  General: Well developed, well nourished,  NAD, appears stated age  HEENT: NCAT, mucous membranes moist.  Neck: Supple, no JVD, no masses    Cardiovascular: S1 S2 auscultated, no rubs, murmurs or gallops. Regular rate and rhythm.  Respiratory: Clear to auscultation bilaterally with equal chest rise  Abdomen: Soft, obese, nontender, nondistended, + bowel sounds  Extremities: warm dry without cyanosis clubbing or edema  Neuro: AAOx3, No focal deficits Skin: Without rashes exudates or nodules  Psych: Normal affect and demeanor   Discharge Instructions      Discharge Orders   Future Appointments Provider Department Dept Phone   03/18/2014 10:00 AM Spencer Cowboy, MD [facility] [phone]   Future Orders Complete By Expires   Diet - low sodium heart healthy  As directed    Diet Carb Modified  As directed    Discharge instructions  As directed    For home use only DME Walker rolling  As directed    Increase activity slowly  As directed        Medication List    STOP taking these medications       ALPRAZolam 1 MG tablet  Commonly known as:  XANAX     HYDROcodone-acetaminophen 5-325 MG per tablet  Commonly known as:  NORCO      TAKE these medications       acetaminophen 325 MG tablet  Commonly known as:  TYLENOL  Take 650 mg by mouth every 6 (six) hours as needed for mild pain or fever.     bisoprolol-hydrochlorothiazide 5-6.25 MG per tablet  Commonly known as:  ZIAC  Take 1 tablet by mouth daily.     fenofibrate micronized 134 MG capsule  Commonly known as:  LOFIBRA  Take 1 capsule (134 mg total) by mouth daily before breakfast.     FISH OIL PO  Take 1,000 mg by mouth daily.     Levetiracetam 750 MG Tb24  Take 2 tablets (1,500 mg total) by mouth daily.     oxyCODONE-acetaminophen 5-325 MG per tablet  Commonly known as:  PERCOCET/ROXICET  Take 1 tablet by mouth every 4 (four) hours as needed for moderate pain or severe pain.     VITAMIN D PO  Take 8,000 Int'l Units by mouth daily.       Allergies  Allergen Reactions  . Penicillins Other (See Comments)     Unknown   Follow-up Information   Follow up with Spencer DAVID, MD. Schedule an appointment as soon as possible for a visit in 1 week. [facility] followup)    Specialty:  Internal Medicine   Contact information:   [address] [place] [address] [phone]       Follow up with [facility]. Schedule an appointment as soon as possible for a visit in 2 weeks. [facility] followup)    Specialty:  Neurology   Contact information:   [address] [place] [address] [phone]       The results of significant diagnostics from this hospitalization (including imaging, microbiology, ancillary and laboratory) are listed below for reference.    Significant Diagnostic Studies: Ct Abdomen Pelvis Wo Contrast  02/13/2014   CLINICAL DATA:  Diffuse abdominal pain, evaluate for kidney stone. EtOH abuse with seizures.  EXAM: CT ABDOMEN AND PELVIS WITHOUT CONTRAST  TECHNIQUE: Multidetector CT imaging of the abdomen and pelvis was performed following the standard protocol without intravenous contrast.  COMPARISON:  05/31/2010  FINDINGS: Mild dependent atelectasis at the lung bases.  Unenhanced  liver, spleen, pancreas, and adrenal glands are within normal limits.  Gallbladder is underdistended. No intrahepatic or extrahepatic ductal dilatation.  Multiple nonobstructing renal calculi, including:  --2 mm interpolar right renal calculus (series 2/ image 35)  --Two left upper pole renal calculi measuring up to 4 mm (series 2/ image 34)  --Two left lower pole renal calculi measuring up to 3 mm (series 2/image 45)  No hydronephrosis.  No evidence of bowel obstruction.  Normal appendix.  No evidence of abdominal aortic aneurysm.  No abdominopelvic ascites.  No suspicious abdominopelvic lymphadenopathy.  Prostate is unremarkable.  No ureteral or bladder calculi.  Bladder is within normal limits.  Degenerative changes of the visualized thoracolumbar spine.   IMPRESSION: Bilateral nonobstructing renal calculi, measuring up to 4 mm, as above.  No ureteral or bladder calculi.  No hydronephrosis.  No evidence of bowel obstruction.  Normal appendix.  No CT findings to account for the patient's abdominal pain.   Electronically Signed   By: Charline BillsSriyesh  Krishnan M.D.   On: 02/13/2014 11:14   Ct Head Wo Contrast  02/12/2014   CLINICAL DATA:  Recent traumatic injury  EXAM: CT HEAD WITHOUT CONTRAST  TECHNIQUE: Contiguous axial images were obtained from the base of the skull through the vertex without intravenous contrast.  COMPARISON:  None.  FINDINGS: Mildly displaced bilateral nasal bone fractures are seen. These are of uncertain chronicity. The visualized paranasal sinuses are unremarkable. The ventricles are within normal limits. No findings to suggest acute hemorrhage, acute infarction or space-occupying mass lesion are noted.  IMPRESSION: Bilateral nasal bone fractures of uncertain chronicity.  No acute intracranial abnormality is noted.   Electronically Signed   By: Alcide CleverMark  Lukens M.D.   On: 02/12/2014 07:36   Mr Laqueta JeanBrain W ZOWo Contrast  02/12/2014   CLINICAL DATA:  New onset seizure  EXAM: MRI HEAD WITHOUT AND WITH CONTRAST  TECHNIQUE: Multiplanar, multiecho pulse sequences of the brain and surrounding structures were obtained without and with intravenous contrast.  CONTRAST:  20mL MULTIHANCE GADOBENATE DIMEGLUMINE 529 MG/ML IV SOLN  COMPARISON:  Head CT same day  FINDINGS: Diffusion imaging does not show any acute or subacute infarction. The brainstem and cerebellum are normal. The cerebral hemispheres show scattered foci of FLAIR and T2 signal within the white matter. This is nonspecific. They could represent an early manifestation of small vessel disease, migraine related foci, insults related to vasculitis or old head trauma. Mesial temporal lobes are normal. After contrast administration, no abnormal enhancement occurs. No skull or skullbase lesion. No pituitary mass. No  inflammatory sinus disease.  IMPRESSION: No acute finding. No specific lesions seen to explain seizures. Scattered white matter foci within the cerebral hemispheres. Most commonly, this is due to an early manifestation of small vessel disease. The differential diagnosis includes migraine related foci, history of vasculitis and prior closed head injury.   Electronically Signed   By: Paulina FusiMark  Shogry M.D.   On: 02/12/2014 20:19   Dg Chest Port 1 View  02/13/2014   CLINICAL DATA:  Chest pain  EXAM: PORTABLE CHEST - 1 VIEW  COMPARISON:  None.  FINDINGS: Normal cardiac silhouette. There are low lung volumes and basilar atelectasis. Mild central venous congestion. No effusion, infiltrate, or pneumothorax.  IMPRESSION: Low lung volumes and central venous congestion.  No infiltrate.   Electronically Signed   By: Genevive BiStewart  Edmunds M.D.   On: 02/13/2014 15:10    Microbiology: No results found for this or any previous visit (from the past 240 hour(s)).  Labs: Basic Metabolic Panel:  Recent Labs Lab 02/12/14 0648 02/13/14 0556 02/13/14 1338 02/14/14 0448  NA 141 142  --  143  K 3.4* 3.3*  --  3.7  CL 102 103  --  104  CO2 23 24  --  26  GLUCOSE 93 104*  --  85  BUN 19 16  --  14  CREATININE 1.17 1.25  --  1.20  CALCIUM 9.6 8.9  --  9.0  MG  --   --  2.4  --    Liver Function Tests: No results found for this basename: AST, ALT, ALKPHOS, BILITOT, PROT, ALBUMIN,  in the last 168 hours No results found for this basename: LIPASE, AMYLASE,  in the last 168 hours No results found for this basename: AMMONIA,  in the last 168 hours CBC:  Recent Labs Lab 02/12/14 0648 02/13/14 0556 02/14/14 0448  WBC 14.2* 8.4 6.6  NEUTROABS 11.6*  --   --   HGB 15.4 14.6 13.9  HCT 42.5 43.3 40.8  MCV 90.6 93.3 93.8  PLT 145* 131* 122*   Cardiac Enzymes:  Recent Labs Lab 02/12/14 2126 02/13/14 0556 02/13/14 0950 02/13/14 1507 02/13/14 2140  TROPONINI 0.96* <0.30 <0.30 <0.30 <0.30   BNP: BNP (last 3  results)  Recent Labs  02/12/14 2126  PROBNP 192.9*   CBG: No results found for this basename: GLUCAP,  in the last 168 hours     Signed:  Edsel PetrinMaryann Daison Braxton  [facility] 02/14/2014, 10:47 AM

## 2014-02-16 ENCOUNTER — Other Ambulatory Visit: Payer: Self-pay | Admitting: Physician Assistant

## 2014-02-17 LAB — BENZODIAZEPINE, QUANTITATIVE, URINE
ALPRAZOLAMU: 77 ng/mL
Alprazolam (GC/LC/MS), ur confirm: 56 ng/mL
CLONAZEPAU: NEGATIVE ng/mL
Diazepam (GC/LC/MS), ur confirm: NEGATIVE ng/mL
Estazolam (GC/LC/MS), ur confirm: NEGATIVE ng/mL
FLUNITRAZEPU: NEGATIVE ng/mL
FLURAZEPAM GC/MS CONF: NEGATIVE ng/mL
Lorazepam (GC/LC/MS), ur confirm: NEGATIVE ng/mL
Midazolam (GC/LC/MS), ur confirm: NEGATIVE ng/mL
NORDIAZEPAM GC/MS CONF: NEGATIVE ng/mL
Oxazepam GC/MS Conf: NEGATIVE ng/mL
TEMAZEPAM GC/MS CONF: NEGATIVE ng/mL
Triazolam metabolite (GC/LC/MS), ur confirm: NEGATIVE ng/mL

## 2014-03-07 ENCOUNTER — Other Ambulatory Visit: Payer: Self-pay | Admitting: Internal Medicine

## 2014-03-18 ENCOUNTER — Encounter: Payer: Self-pay | Admitting: Internal Medicine

## 2014-05-02 NOTE — Patient Instructions (Addendum)
 Recommend the book "The END of DIETING" by Dr Joel Furman   and the book "The END of DIABETES " by Dr Joel Fuhrman  At [employer] - get book & Audio CD's      Being diabetic has a  300% increased risk for heart attack, stroke, cancer, and alzheimer- type vascular dementia. It is very important that you work harder with diet by avoiding all foods that are white except chicken & fish. Avoid white rice (brown & wild rice is OK), white potatoes (sweetpotatoes in moderation is OK), White bread or wheat bread or anything made out of white flour like bagels, donuts, rolls, buns, biscuits, cakes, pastries, cookies, pizza crust, and pasta (made from white flour & egg whites) - vegetarian pasta or spinach or wheat pasta is OK. Multigrain breads like Arnold's or [employer], or multigrain sandwich thins or flatbreads.  Diet, exercise and weight loss can reverse and cure diabetes in the early stages.  Diet, exercise and weight loss is very important in the control and prevention of complications of diabetes which affects every system in your body, ie. Brain - dementia/stroke, eyes - glaucoma/blindness, heart - heart attack/heart failure, kidneys - dialysis, stomach - gastric paralysis, intestines - malabsorption, nerves - severe painful neuritis, circulation - gangrene & loss of a leg(s), and finally cancer and Alzheimers.    I recommend avoid fried & greasy foods,  sweets/candy, white rice (brown or wild rice or Quinoa is OK), white potatoes (sweet potatoes are OK) - anything made from white flour - bagels, doughnuts, rolls, buns, biscuits,white and wheat breads, pizza crust and traditional pasta made of white flour & egg white(vegetarian pasta or spinach or wheat pasta is OK).  Multi-grain bread is OK - like multi-grain flat bread or sandwich thins. Avoid alcohol in excess. Exercise is also important.    Eat all the vegetables you want - avoid meat, especially red meat and dairy - especially cheese.  Cheese  is the most concentrated form of trans-fats which is the worst thing to clog up our arteries. Veggie cheese is OK which can be found in the fresh produce section at [employer] or [employer] or [employer]    Hypertension As your heart beats, it forces blood through your arteries. This force is your blood pressure. If the pressure is too high, it is called hypertension (HTN) or high blood pressure. HTN is dangerous because you may have it and not know it. High blood pressure may mean that your heart has to work harder to pump blood. Your arteries may be narrow or stiff. The extra work puts you at risk for heart disease, stroke, and other problems.  Blood pressure consists of two numbers, a higher number over a lower, 110/72, for example. It is stated as "110 over 72." The ideal is below 120 for the top number (systolic) and under 80 for the bottom (diastolic). Write down your blood pressure today. You should pay close attention to your blood pressure if you have certain conditions such as:  Heart failure.  Prior heart attack.  Diabetes  Chronic kidney disease.  Prior stroke.  Multiple risk factors for heart disease. To see if you have HTN, your blood pressure should be measured while you are seated with your arm held at the level of the heart. It should be measured at least twice. A one-time elevated blood pressure reading (especially in the Emergency Department) does not mean that you need treatment. There may be conditions in which the   blood pressure is different between your right and left arms. It is important to see your caregiver soon for a recheck. Most people have essential hypertension which means that there is not a specific cause. This type of high blood pressure may be lowered by changing lifestyle factors such as:  Stress.  Smoking.  Lack of exercise.  Excessive weight.  Drug/tobacco/alcohol use.  Eating less salt. Most people do not have symptoms from high blood  pressure until it has caused damage to the body. Effective treatment can often prevent, delay or reduce that damage. TREATMENT  When a cause has been identified, treatment for high blood pressure is directed at the cause. There are a large number of medications to treat HTN. These fall into several categories, and your caregiver will help you select the medicines that are best for you. Medications may have side effects. You should review side effects with your caregiver. If your blood pressure stays high after you have made lifestyle changes or started on medicines,   Your medication(s) may need to be changed.  Other problems may need to be addressed.  Be certain you understand your prescriptions, and know how and when to take your medicine.  Be sure to follow up with your caregiver within the time frame advised (usually within two weeks) to have your blood pressure rechecked and to review your medications.  If you are taking more than one medicine to lower your blood pressure, make sure you know how and at what times they should be taken. Taking two medicines at the same time can result in blood pressure that is too low. SEEK IMMEDIATE MEDICAL CARE IF:  You develop a severe headache, blurred or changing vision, or confusion.  You have unusual weakness or numbness, or a faint feeling.  You have severe chest or abdominal pain, vomiting, or breathing problems. MAKE SURE YOU:   Understand these instructions.  Will watch your condition.  Will get help right away if you are not doing well or get worse.   Diabetes and Exercise Exercising regularly is important. It is not just about losing weight. It has many health benefits, such as:  Improving your overall fitness, flexibility, and endurance.  Increasing your bone density.  Helping with weight control.  Decreasing your body fat.  Increasing your muscle strength.  Reducing stress and tension.  Improving your overall  health. People with diabetes who exercise gain additional benefits because exercise:  Reduces appetite.  Improves the body's use of blood sugar (glucose).  Helps lower or control blood glucose.  Decreases blood pressure.  Helps control blood lipids (such as cholesterol and triglycerides).  Improves the body's use of the hormone insulin by:  Increasing the body's insulin sensitivity.  Reducing the body's insulin needs.  Decreases the risk for heart disease because exercising:  Lowers cholesterol and triglycerides levels.  Increases the levels of good cholesterol (such as high-density lipoproteins [HDL]) in the body.  Lowers blood glucose levels. YOUR ACTIVITY PLAN  Choose an activity that you enjoy and set realistic goals. Your health care provider or diabetes educator can help you make an activity plan that works for you. You can break activities into 2 or 3 sessions throughout the day. Doing so is as good as one long session. Exercise ideas include:  Taking the dog for a walk.  Taking the stairs instead of the elevator.  Dancing to your favorite song.  Doing your favorite exercise with a friend. RECOMMENDATIONS FOR EXERCISING WITH TYPE 1 OR   TYPE 2 DIABETES   Check your blood glucose before exercising. If blood glucose levels are greater than 240 mg/dL, check for urine ketones. Do not exercise if ketones are present.  Avoid injecting insulin into areas of the body that are going to be exercised. For example, avoid injecting insulin into:  The arms when playing tennis.  The legs when jogging.  Keep a record of:  Food intake before and after you exercise.  Expected peak times of insulin action.  Blood glucose levels before and after you exercise.  The type and amount of exercise you have done.  Review your records with your health care provider. Your health care provider will help you to develop guidelines for adjusting food intake and insulin amounts before and  after exercising.  If you take insulin or oral hypoglycemic agents, watch for signs and symptoms of hypoglycemia. They include:  Dizziness.  Shaking.  Sweating.  Chills.  Confusion.  Drink plenty of water while you exercise to prevent dehydration or heat stroke. Body water is lost during exercise and must be replaced.  Talk to your health care provider before starting an exercise program to make sure it is safe for you. Remember, almost any type of activity is better than none.    Cholesterol Cholesterol is a white, waxy, fat-like protein needed by your body in small amounts. The liver makes all the cholesterol you need. It is carried from the liver by the blood through the blood vessels. Deposits (plaque) may build up on blood vessel walls. This makes the arteries narrower and stiffer. Plaque increases the risk for heart attack and stroke. You cannot feel your cholesterol level even if it is very high. The only way to know is by a blood test to check your lipid (fats) levels. Once you know your cholesterol levels, you should keep a record of the test results. Work with your caregiver to to keep your levels in the desired range. WHAT THE RESULTS MEAN:  Total cholesterol is a rough measure of all the cholesterol in your blood.  LDL is the so-called bad cholesterol. This is the type that deposits cholesterol in the walls of the arteries. You want this level to be low.  HDL is the good cholesterol because it cleans the arteries and carries the LDL away. You want this level to be high.  Triglycerides are fat that the body can either burn for energy or store. High levels are closely linked to heart disease. DESIRED LEVELS:  Total cholesterol below 200.  LDL below 100 for people at risk, below 70 for very high risk.  HDL above 50 is good, above 60 is best.  Triglycerides below 150. HOW TO LOWER YOUR CHOLESTEROL:  Diet.  Choose fish or white meat chicken and turkey, roasted or  baked. Limit fatty cuts of red meat, fried foods, and processed meats, such as sausage and lunch meat.  Eat lots of fresh fruits and vegetables. Choose whole grains, beans, pasta, potatoes and cereals.  Use only small amounts of olive, corn or canola oils. Avoid butter, mayonnaise, shortening or palm kernel oils. Avoid foods with trans-fats.  Use skim/nonfat milk and low-fat/nonfat yogurt and cheeses. Avoid whole milk, cream, ice cream, egg yolks and cheeses. Healthy desserts include angel food cake, ginger snaps, animal crackers, hard candy, popsicles, and low-fat/nonfat frozen yogurt. Avoid pastries, cakes, pies and cookies.  Exercise.  A regular program helps decrease LDL and raises HDL.  Helps with weight control.  Do things that increase   your activity level like gardening, walking, or taking the stairs.  Medication.  May be prescribed by your caregiver to help lowering cholesterol and the risk for heart disease.  You may need medicine even if your levels are normal if you have several risk factors. HOME CARE INSTRUCTIONS   Follow your diet and exercise programs as suggested by your caregiver.  Take medications as directed.  Have blood work done when your caregiver feels it is necessary. MAKE SURE YOU:   Understand these instructions.  Will watch your condition.  Will get help right away if you are not doing well or get worse.      Vitamin D Deficiency Vitamin D is an important vitamin that your body needs. Having too little of it in your body is called a deficiency. A very bad deficiency can make your bones soft and can cause a condition called rickets.  Vitamin D is important to your body for different reasons, such as:   It helps your body absorb 2 minerals called calcium and phosphorus.  It helps make your bones healthy.  It may prevent some diseases, such as diabetes and multiple sclerosis.  It helps your muscles and heart. You can get vitamin D in several  ways. It is a natural part of some foods. The vitamin is also added to some dairy products and cereals. Some people take vitamin D supplements. Also, your body makes vitamin D when you are in the sun. It changes the sun's rays into a form of the vitamin that your body can use. CAUSES   Not eating enough foods that contain vitamin D.  Not getting enough sunlight.  Having certain digestive system diseases that make it hard to absorb vitamin D. These diseases include Crohn's disease, chronic pancreatitis, and cystic fibrosis.  Having a surgery in which part of the stomach or small intestine is removed.  Being obese. Fat cells pull vitamin D out of your blood. That means that obese people may not have enough vitamin D left in their blood and in other body tissues.  Having chronic kidney or liver disease. RISK FACTORS Risk factors are things that make you more likely to develop a vitamin D deficiency. They include:  Being older.  Not being able to get outside very much.  Living in a nursing home.  Having had broken bones.  Having weak or thin bones (osteoporosis).  Having a disease or condition that changes how your body absorbs vitamin D.  Having dark skin.  Some medicines such as seizure medicines or steroids.  Being overweight or obese. SYMPTOMS Mild cases of vitamin D deficiency may not have any symptoms. If you have a very bad case, symptoms may include:  Bone pain.  Muscle pain.  Falling often.  Broken bones caused by a minor injury, due to osteoporosis. DIAGNOSIS A blood test is the best way to tell if you have a vitamin D deficiency. TREATMENT Vitamin D deficiency can be treated in different ways. Treatment for vitamin D deficiency depends on what is causing it. Options include:  Taking vitamin D supplements.  Taking a calcium supplement. Your caregiver will suggest what dose is best for you. HOME CARE INSTRUCTIONS  Take any supplements that your caregiver  prescribes. Follow the directions carefully. Take only the suggested amount.  Have your blood tested 2 months after you start taking supplements.  Eat foods that contain vitamin D. Healthy choices include:  Fortified dairy products, cereals, or juices. Fortified means vitamin D has been   added to the food. Check the label on the package to be sure.  Fatty fish like salmon or trout.  Eggs.  Oysters.  Do not use a tanning bed.  Keep your weight at a healthy level. Lose weight if you need to.  Keep all follow-up appointments. Your caregiver will need to perform blood tests to make sure your vitamin D deficiency is going away. SEEK MEDICAL CARE IF:  You have any questions about your treatment.  You continue to have symptoms of vitamin D deficiency.  You have nausea or vomiting.  You are constipated.  You feel confused.  You have severe abdominal or back pain. MAKE SURE YOU:  Understand these instructions.  Will watch your condition.  Will get help right away if you are not doing well or get worse.   

## 2014-05-02 NOTE — Progress Notes (Signed)
Patient ID: Spencer Humphrey, male   DOB: 21-Mar-1966, 48 y.o.   MRN: [id number]   Annual Screening Comprehensive Examination  This very nice 48 y.o.MWM presents for complete physical.  Patient has been followed for HTN, Diabetes  Prediabetes, Hyperlipidemia, and Vitamin D Deficiency.   Patient relates in Apr 2015 falling 14-16 feet at work sustaining a concussion and being hospitalized x 2 days.Both CTs & MRI of the brain were neg & nl. He had a seizure shortly thereafter and was initiated on Keppra. An EEG did show a focal abnormality which improved on subsequent EEG.  In addition he had a Lt shoulder injury and c/o back pain and has been followed expectantly by Dr's Yisroel Ramming, Wang & Dumonski .   HTN predates since 2008. Patient's BP has been controlled at home.Today's BP: 122/84 mmHg. Patient denies any cardiac symptoms as chest pain, palpitations, shortness of breath, dizziness or ankle swelling.   Patient's hyperlipidemia is controlled with diet and medications which he has since d/c'd from his last OV/lab check. Patient denies myalgias or other medication SE's. Last cholesterol last visit was 183,TG 660, HDL 33 and LDL was not calculated.   Patient has prediabetes with an elevated A1c 5.9% in Feb 2014 and last A1c was  5.8% in Dec 2014.  Patient denies reactive hypoglycemic symptoms, visual blurring, diabetic polys or paresthesias.    Finally, patient has history of Vitamin D Deficiency of 37 in 2014 and last vitamin D was 96 in Dec 2014.   Medication Sig  . acetaminophen  325 MG tablet Take  2 tabs every 6 hours as needed    . bisoprolol-hctz) 5-6.25 TAKE 1 TABLET EVERY MORNING   . VITAMIN D  Take 8,000 Int'l Units  daily.  . fenofibrate  134 MG capsule TAKE 1 CAPSULE  DAILY   . levETIRAcetam 750 MG TB24 Take 2 tablets   daily.  Marland Kitchen FISH OIL  Take 1,000 mg  daily.  Marland Kitchen oxyCODONE-APAP  Take 1 tablet every 4  hours as needed   Allergies  Allergen Reactions  . Penicillins Other (See Comments)     Unknown   Past Medical History  Diagnosis Date  . Hypertension   . Kidney stones   . Plantar fasciitis   . Prediabetes   . DJD (degenerative joint disease) of knee     Right   Past Surgical History  Procedure Laterality Date  . Knee arthroscopy Right 1987   No family history on file. History   Social History  . Marital Status:  X 16 years    Spouse Name: N/A    Number of Children: N/A  . Years of Education: N/A   Occupational History  . Works on [employer], [employer]    Social History Main Topics  . Smoking status: Former Smoker    Quit date: 07/22/2002  . Smokeless tobacco: Not on file  . Alcohol Use: No  . Drug Use: No  . Sexual Activity: Not on file    ROS Constitutional: Denies fever, chills, weight loss/gain, headaches, insomnia, fatigue, night sweats or change in appetite. Eyes: Denies redness, blurred vision, diplopia, discharge, itchy or watery eyes.  ENT: Denies discharge, congestion, post nasal drip, epistaxis, sore throat, earache, hearing loss, dental pain, Tinnitus, Vertigo, Sinus pain or snoring.  Cardio: Denies chest pain, palpitations, irregular heartbeat, syncope, dyspnea, diaphoresis, orthopnea, PND, claudication or edema Respiratory: denies cough, dyspnea, DOE, pleurisy, hoarseness, laryngitis or wheezing.  Gastrointestinal: Denies dysphagia, heartburn, reflux, water brash, pain, cramps,  nausea, vomiting, bloating, diarrhea, constipation, hematemesis, melena, hematochezia, jaundice or hemorrhoids Genitourinary: Denies dysuria, frequency, urgency, nocturia, hesitancy, discharge, hematuria or flank pain Musculoskeletal: Denies arthralgia, myalgia, stiffness, Jt. Swelling, pain, limp or strain/sprain. Skin: Denies puritis, rash, hives, warts, acne, eczema or change in skin lesion Neuro: No weakness, tremor, incoordination, spasms, paresthesia or pain Psychiatric: Denies confusion, memory loss or sensory loss Endocrine: Denies change in weight,  skin, hair change, nocturia, and paresthesia, diabetic polys, visual blurring or hyper / hypo glycemic episodes.  Heme/Lymph: No excessive bleeding, bruising or enlarged lymph nodes.  Physical Exam  BP 122/84  Pulse 56  Temp99.5 F  Resp 16  Ht 5' 11.75"   Wt 238 lb 12.8 oz   BMI 32.63 kg/m2  General Appearance: Well nourished, in no apparent distress. Eyes: PERRLA, EOMs, conjunctiva no swelling or erythema, normal fundi and vessels. Sinuses: No frontal/maxillary tenderness ENT/Mouth: EACs patent / TMs  nl. Nares clear without erythema, swelling, mucoid exudates. Oral hygiene is good. No erythema, swelling, or exudate. Tongue normal, non-obstructing. Tonsils not swollen or erythematous. Hearing normal.  Neck: Supple, thyroid normal. No bruits, nodes or JVD. Respiratory: Respiratory effort normal.  BS equal and clear bilateral without rales, rhonci, wheezing or stridor. Cardio: Heart sounds are normal with regular rate and rhythm and no murmurs, rubs or gallops. Peripheral pulses are normal and equal bilaterally without edema. No aortic or femoral bruits. Chest: symmetric with normal excursions and percussion.  Abdomen: Flat, soft, with bowl sounds. Nontender, no guarding, rebound, hernias, masses, or organomegaly.  Lymphatics: Non tender without lymphadenopathy.  Genitourinary: No hernias.Testes nl. DRE - prostate nl for age - smooth & firm w/o nodules. Musculoskeletal: Dec ROM of the Lt shoulder. Skin: Warm and dry without rashes, lesions, cyanosis, clubbing or  ecchymosis.  Neuro: Cranial nerves intact, reflexes equal bilaterally. Normal muscle tone, no cerebellar symptoms. Sensation intact.  Pysch: Awake and oriented X 3with normal affect, insight and judgment appropriate.  Assessment and Plan  1. Annual Screening Examination 2. Hypertension  3. Hyperlipidemia 4. Pre Diabetes 5. Vitamin D Deficiency 6. Seizures - possibly withdrawal  Continue prudent diet as discussed, weight  control, BP monitoring, regular exercise, and medications as discussed.  Discussed med effects and SE's. Routine screening labs and tests as requested with regular follow-up as recommended.

## 2014-05-03 ENCOUNTER — Encounter: Payer: Self-pay | Admitting: Internal Medicine

## 2014-05-03 ENCOUNTER — Ambulatory Visit (INDEPENDENT_AMBULATORY_CARE_PROVIDER_SITE_OTHER): Payer: BC Managed Care – PPO | Admitting: Internal Medicine

## 2014-05-03 VITALS — BP 122/84 | HR 56 | Temp 99.5°F | Resp 16 | Ht 71.75 in | Wt 238.8 lb

## 2014-05-03 DIAGNOSIS — Z79899 Other long term (current) drug therapy: Secondary | ICD-10-CM

## 2014-05-03 DIAGNOSIS — E559 Vitamin D deficiency, unspecified: Secondary | ICD-10-CM

## 2014-05-03 DIAGNOSIS — Z125 Encounter for screening for malignant neoplasm of prostate: Secondary | ICD-10-CM

## 2014-05-03 DIAGNOSIS — Z113 Encounter for screening for infections with a predominantly sexual mode of transmission: Secondary | ICD-10-CM

## 2014-05-03 DIAGNOSIS — R740 Nonspecific elevation of levels of transaminase and lactic acid dehydrogenase [LDH]: Secondary | ICD-10-CM

## 2014-05-03 DIAGNOSIS — Z0000 Encounter for general adult medical examination without abnormal findings: Secondary | ICD-10-CM

## 2014-05-03 DIAGNOSIS — Z1212 Encounter for screening for malignant neoplasm of rectum: Secondary | ICD-10-CM

## 2014-05-03 DIAGNOSIS — Z111 Encounter for screening for respiratory tuberculosis: Secondary | ICD-10-CM

## 2014-05-03 DIAGNOSIS — R7402 Elevation of levels of lactic acid dehydrogenase (LDH): Secondary | ICD-10-CM

## 2014-05-03 DIAGNOSIS — I10 Essential (primary) hypertension: Secondary | ICD-10-CM

## 2014-05-03 LAB — HEMOGLOBIN A1C
HEMOGLOBIN A1C: 5.8 % — AB (ref <5.7)
MEAN PLASMA GLUCOSE: 120 mg/dL — AB (ref <117)

## 2014-05-04 LAB — HEPATITIS C ANTIBODY: HCV AB: NEGATIVE

## 2014-05-04 LAB — MICROALBUMIN / CREATININE URINE RATIO
Creatinine, Urine: 197.9 mg/dL
Microalb Creat Ratio: 3.7 mg/g (ref 0.0–30.0)
Microalb, Ur: 0.73 mg/dL (ref 0.00–1.89)

## 2014-05-04 LAB — VITAMIN D 25 HYDROXY (VIT D DEFICIENCY, FRACTURES): VIT D 25 HYDROXY: 55 ng/mL (ref 30–89)

## 2014-05-04 LAB — HEPATITIS A ANTIBODY, TOTAL: Hep A Total Ab: NONREACTIVE

## 2014-05-04 LAB — HEPATITIS B SURFACE ANTIBODY,QUALITATIVE: Hep B S Ab: NEGATIVE

## 2014-05-04 LAB — TESTOSTERONE: TESTOSTERONE: 328 ng/dL (ref 300–890)

## 2014-05-04 LAB — URINALYSIS, MICROSCOPIC ONLY
BACTERIA UA: NONE SEEN
CRYSTALS: NONE SEEN
Casts: NONE SEEN
SQUAMOUS EPITHELIAL / LPF: NONE SEEN

## 2014-05-04 LAB — PSA: PSA: 0.86 ng/mL (ref <=4.00)

## 2014-05-04 LAB — VITAMIN B12: VITAMIN B 12: 453 pg/mL (ref 211–911)

## 2014-05-04 LAB — RPR

## 2014-05-04 LAB — INSULIN, FASTING: Insulin fasting, serum: 83 u[IU]/mL — ABNORMAL HIGH (ref 3–28)

## 2014-05-04 LAB — HIV ANTIBODY (ROUTINE TESTING W REFLEX): HIV: NONREACTIVE

## 2014-05-04 LAB — TSH: TSH: 0.842 u[IU]/mL (ref 0.350–4.500)

## 2014-05-04 LAB — HEPATITIS B CORE ANTIBODY, TOTAL: HEP B C TOTAL AB: NONREACTIVE

## 2014-05-05 LAB — HEPATITIS B E ANTIBODY: Hepatitis Be Antibody: NONREACTIVE

## 2014-05-06 LAB — TB SKIN TEST
Induration: 0 mm
TB Skin Test: NEGATIVE

## 2014-08-10 ENCOUNTER — Ambulatory Visit: Payer: Self-pay | Admitting: Physician Assistant

## 2014-11-11 ENCOUNTER — Ambulatory Visit (INDEPENDENT_AMBULATORY_CARE_PROVIDER_SITE_OTHER): Payer: BLUE CROSS/BLUE SHIELD | Admitting: Internal Medicine

## 2014-11-11 ENCOUNTER — Encounter: Payer: Self-pay | Admitting: Internal Medicine

## 2014-11-11 VITALS — BP 150/100 | HR 84 | Temp 98.2°F | Resp 16 | Ht 71.75 in | Wt 255.0 lb

## 2014-11-11 DIAGNOSIS — Z79899 Other long term (current) drug therapy: Secondary | ICD-10-CM

## 2014-11-11 DIAGNOSIS — R7303 Prediabetes: Secondary | ICD-10-CM

## 2014-11-11 DIAGNOSIS — R7309 Other abnormal glucose: Secondary | ICD-10-CM

## 2014-11-11 DIAGNOSIS — I10 Essential (primary) hypertension: Secondary | ICD-10-CM

## 2014-11-11 DIAGNOSIS — E782 Mixed hyperlipidemia: Secondary | ICD-10-CM

## 2014-11-11 DIAGNOSIS — E559 Vitamin D deficiency, unspecified: Secondary | ICD-10-CM

## 2014-11-11 NOTE — Patient Instructions (Signed)

## 2014-11-12 LAB — BASIC METABOLIC PANEL WITH GFR
BUN: 22 mg/dL (ref 6–23)
CO2: 28 meq/L (ref 19–32)
Calcium: 9.3 mg/dL (ref 8.4–10.5)
Chloride: 105 mEq/L (ref 96–112)
Creat: 1.36 mg/dL — ABNORMAL HIGH (ref 0.50–1.35)
GFR, EST AFRICAN AMERICAN: 70 mL/min
GFR, Est Non African American: 61 mL/min
GLUCOSE: 97 mg/dL (ref 70–99)
Potassium: 4.3 mEq/L (ref 3.5–5.3)
Sodium: 140 mEq/L (ref 135–145)

## 2014-11-12 LAB — HEPATIC FUNCTION PANEL
ALT: 25 U/L (ref 0–53)
AST: 20 U/L (ref 0–37)
Albumin: 4.5 g/dL (ref 3.5–5.2)
Alkaline Phosphatase: 94 U/L (ref 39–117)
BILIRUBIN DIRECT: 0.2 mg/dL (ref 0.0–0.3)
BILIRUBIN TOTAL: 1.2 mg/dL (ref 0.2–1.2)
Indirect Bilirubin: 1 mg/dL (ref 0.2–1.2)
Total Protein: 6.5 g/dL (ref 6.0–8.3)

## 2014-11-12 LAB — LIPID PANEL
CHOL/HDL RATIO: 4.2 ratio
CHOLESTEROL: 185 mg/dL (ref 0–200)
HDL: 44 mg/dL (ref >39)
LDL Cholesterol: 90 mg/dL (ref 0–99)
TRIGLYCERIDES: 256 mg/dL — AB (ref <150)
VLDL: 51 mg/dL — AB (ref 0–40)

## 2014-11-12 LAB — CBC WITH DIFFERENTIAL/PLATELET
Basophils Absolute: 0 10*3/uL (ref 0.0–0.1)
Basophils Relative: 0 % (ref 0–1)
EOS PCT: 2 % (ref 0–5)
Eosinophils Absolute: 0.1 10*3/uL (ref 0.0–0.7)
HCT: 43.1 % (ref 39.0–52.0)
HEMOGLOBIN: 14.8 g/dL (ref 13.0–17.0)
Lymphocytes Relative: 28 % (ref 12–46)
Lymphs Abs: 1.8 10*3/uL (ref 0.7–4.0)
MCH: 31.8 pg (ref 26.0–34.0)
MCHC: 34.3 g/dL (ref 30.0–36.0)
MCV: 92.7 fL (ref 78.0–100.0)
MONOS PCT: 11 % (ref 3–12)
MPV: 12.2 fL (ref 8.6–12.4)
Monocytes Absolute: 0.7 10*3/uL (ref 0.1–1.0)
NEUTROS PCT: 59 % (ref 43–77)
Neutro Abs: 3.7 10*3/uL (ref 1.7–7.7)
Platelets: 124 10*3/uL — ABNORMAL LOW (ref 150–400)
RBC: 4.65 MIL/uL (ref 4.22–5.81)
RDW: 13.8 % (ref 11.5–15.5)
WBC: 6.3 10*3/uL (ref 4.0–10.5)

## 2014-11-12 LAB — VITAMIN D 25 HYDROXY (VIT D DEFICIENCY, FRACTURES): VIT D 25 HYDROXY: 31 ng/mL (ref 30–100)

## 2014-11-12 LAB — TSH: TSH: 1.6 u[IU]/mL (ref 0.350–4.500)

## 2014-11-12 LAB — HEMOGLOBIN A1C
HEMOGLOBIN A1C: 5.7 % — AB (ref <5.7)
MEAN PLASMA GLUCOSE: 117 mg/dL — AB (ref <117)

## 2014-11-12 LAB — INSULIN, FASTING: INSULIN FASTING, SERUM: 35.9 u[IU]/mL — AB (ref 2.0–19.6)

## 2014-11-12 LAB — MAGNESIUM: Magnesium: 1.8 mg/dL (ref 1.5–2.5)

## 2014-11-13 NOTE — Progress Notes (Signed)
Patient ID: Spencer Humphrey, male   DOB: 06/12/1966, 49 y.o.   MRN: [id number]   This very nice 49 y.o. MWM presents for 3 month follow up with Hypertension, Hyperlipidemia, Pre-Diabetes and Vitamin D Deficiency.    Patient is treated for labile  HTN (2008) being monitored expectantly & BP has been controlled at home. Today's BPwas 150/100  reck'd at 142/88. Patient has had no complaints of any cardiac type chest pain, palpitations, dyspnea/orthopnea/PND, dizziness, claudication, or dependent edema.   Hyperlipidemia is controlled with diet & meds. Patient denies myalgias or other med SE's. Last Lipids were at goal - Total Chol 185; HDL 44; LDL 90;with elevated Trig 409 on 11/11/2014   Also, the patient has history of  PreDiabetes with A1c 5.9% in Feb 2014 and patient has had no symptoms of reactive hypoglycemia, diabetic polys, paresthesias or visual blurring.  Last A1c was  5.7% on  11/11/2014.   Further, the patient also has history of Vitamin D Deficiency of 37 in 2014 and sporadically supplements vitamin D. Last vitamin D was 31 on  11/11/2014.   Medication List   FISH OIL PO  Take 1,000 mg by mouth daily.     oxyCODONE-acetaminophen 5-325 MG per tablet  Commonly known as:  PERCOCET/ROXICET  Take 1 tablet by mouth every 4 (four) hours as needed for moderate pain     SUMAtriptan 100 MG tablet  Commonly known as:  IMITREX     topiramate 100 MG tablet  Commonly known as:  TOPAMAX     VITAMIN D PO  Take 8,000 Int'l Units by mouth daily.     VOLTAREN 1 % Gel  Generic drug:  diclofenac sodium     Allergies  Allergen Reactions  . Fenofibrate Nausea Only  . Penicillins Other (See Comments)    Unknown   PMHx:   Past Medical History  Diagnosis Date  . Hypertension   . Kidney stones   . Plantar fasciitis   . Prediabetes   . DJD (degenerative joint disease) of knee     Right   Immunization History  Administered Date(s) Administered  . PPD Test 05/03/2014  . Pneumococcal  Polysaccharide-23 03/12/2013  . Tdap 03/12/2013   Past Surgical History  Procedure Laterality Date  . Knee arthroscopy Right 1987   FHx:    Reviewed / unchanged  SHx:    Reviewed / unchanged  Systems Review:  Constitutional: Denies fever, chills, wt changes, headaches, insomnia, fatigue, night sweats, change in appetite. Eyes: Denies redness, blurred vision, diplopia, discharge, itchy, watery eyes.  ENT: Denies discharge, congestion, post nasal drip, epistaxis, sore throat, earache, hearing loss, dental pain, tinnitus, vertigo, sinus pain, snoring.  CV: Denies chest pain, palpitations, irregular heartbeat, syncope, dyspnea, diaphoresis, orthopnea, PND, claudication or edema. Respiratory: denies cough, dyspnea, DOE, pleurisy, hoarseness, laryngitis, wheezing.  Gastrointestinal: Denies dysphagia, odynophagia, heartburn, reflux, water brash, abdominal pain or cramps, nausea, vomiting, bloating, diarrhea, constipation, hematemesis, melena, hematochezia  or hemorrhoids. Genitourinary: Denies dysuria, frequency, urgency, nocturia, hesitancy, discharge, hematuria or flank pain. Musculoskeletal: Denies arthralgias, myalgias, stiffness, jt. swelling, pain, limping or strain/sprain.  Skin: Denies pruritus, rash, hives, warts, acne, eczema or change in skin lesion(s). Neuro: No weakness, tremor, incoordination, spasms, paresthesia or pain. Psychiatric: Denies confusion, memory loss or sensory loss. Endo: Denies change in weight, skin or hair change.  Heme/Lymph: No excessive bleeding, bruising or enlarged lymph nodes.  Physical Exam  BP 150/100   P 84  T 98.2 F   R 16  Ht 5' 11.75"   Wt 255 lb              BMI 34.84  Appears well nourished and in no distress. Eyes: PERRLA, EOMs, conjunctiva no swelling or erythema. Sinuses: No frontal/maxillary tenderness ENT/Mouth: EAC's clear, TM's nl w/o erythema, bulging. Nares clear w/o erythema, swelling, exudates. Oropharynx clear without erythema  or exudates. Oral hygiene is good. Tongue normal, non obstructing. Hearing intact.  Neck: Supple. Thyroid nl. Car 2+/2+ without bruits, nodes or JVD. Chest: Respirations nl with BS clear & equal w/o rales, rhonchi, wheezing or stridor.  Cor: Heart sounds normal w/ regular rate and rhythm without sig. murmurs, gallops, clicks, or rubs. Peripheral pulses normal and equal  without edema.  Abdomen: Soft & bowel sounds normal. Non-tender w/o guarding, rebound, hernias, masses, or organomegaly.  Lymphatics: Unremarkable.  Musculoskeletal: Full ROM all peripheral extremities, joint stability, 5/5 strength, and normal gait.  Skin: Warm, dry without exposed rashes, lesions or ecchymosis apparent.  Neuro: Cranial nerves intact, reflexes equal bilaterally. Sensory-motor testing grossly intact. Tendon reflexes grossly intact.  Pysch: Alert & oriented x 3.  Insight and judgement nl & appropriate. No ideations.  Assessment and Plan:  1. Hypertension - Continue monitor blood pressure at home. Continue diet/meds same.  2. Hyperlipidemia - Continue diet/meds, exercise,& lifestyle modifications. Continue monitor periodic cholesterol/liver & renal functions   3. Pre-Diabetes - Continue diet, exercise, lifestyle modifications. Monitor appropriate labs.  4. Vitamin D Deficiency - Continue supplementation.   Recommended regular exercise, BP monitoring, weight control, and discussed med and SE's. Recommended labs to assess and monitor clinical status. Further disposition pending results of labs.

## 2015-02-11 ENCOUNTER — Encounter: Payer: Self-pay | Admitting: Physician Assistant

## 2015-02-11 ENCOUNTER — Ambulatory Visit (INDEPENDENT_AMBULATORY_CARE_PROVIDER_SITE_OTHER): Payer: BLUE CROSS/BLUE SHIELD | Admitting: Physician Assistant

## 2015-02-11 VITALS — BP 138/88 | HR 68 | Temp 97.7°F | Resp 16 | Ht 71.75 in | Wt 260.0 lb

## 2015-02-11 DIAGNOSIS — R7303 Prediabetes: Secondary | ICD-10-CM

## 2015-02-11 DIAGNOSIS — Z79899 Other long term (current) drug therapy: Secondary | ICD-10-CM

## 2015-02-11 DIAGNOSIS — E669 Obesity, unspecified: Secondary | ICD-10-CM

## 2015-02-11 DIAGNOSIS — I10 Essential (primary) hypertension: Secondary | ICD-10-CM

## 2015-02-11 DIAGNOSIS — E559 Vitamin D deficiency, unspecified: Secondary | ICD-10-CM

## 2015-02-11 DIAGNOSIS — R7309 Other abnormal glucose: Secondary | ICD-10-CM

## 2015-02-11 DIAGNOSIS — R569 Unspecified convulsions: Secondary | ICD-10-CM

## 2015-02-11 DIAGNOSIS — E782 Mixed hyperlipidemia: Secondary | ICD-10-CM

## 2015-02-11 LAB — CBC WITH DIFFERENTIAL/PLATELET
BASOS ABS: 0 10*3/uL (ref 0.0–0.1)
BASOS PCT: 0 % (ref 0–1)
EOS ABS: 0 10*3/uL (ref 0.0–0.7)
Eosinophils Relative: 1 % (ref 0–5)
HEMATOCRIT: 44.1 % (ref 39.0–52.0)
HEMOGLOBIN: 14.8 g/dL (ref 13.0–17.0)
LYMPHS ABS: 1.4 10*3/uL (ref 0.7–4.0)
LYMPHS PCT: 31 % (ref 12–46)
MCH: 32.3 pg (ref 26.0–34.0)
MCHC: 33.6 g/dL (ref 30.0–36.0)
MCV: 96.3 fL (ref 78.0–100.0)
MONO ABS: 0.5 10*3/uL (ref 0.1–1.0)
MPV: 11.5 fL (ref 8.6–12.4)
Monocytes Relative: 10 % (ref 3–12)
Neutro Abs: 2.6 10*3/uL (ref 1.7–7.7)
Neutrophils Relative %: 58 % (ref 43–77)
PLATELETS: 126 10*3/uL — AB (ref 150–400)
RBC: 4.58 MIL/uL (ref 4.22–5.81)
RDW: 13.7 % (ref 11.5–15.5)
WBC: 4.5 10*3/uL (ref 4.0–10.5)

## 2015-02-11 LAB — HEMOGLOBIN A1C
Hgb A1c MFr Bld: 5.8 % — ABNORMAL HIGH (ref <5.7)
Mean Plasma Glucose: 120 mg/dL — ABNORMAL HIGH (ref <117)

## 2015-02-11 MED ORDER — BISOPROLOL-HYDROCHLOROTHIAZIDE 5-6.25 MG PO TABS: ORAL_TABLET | ORAL | Status: DC

## 2015-02-11 NOTE — Patient Instructions (Signed)
  Bad carbs also include fruit juice, alcohol, and sweet tea. These are empty calories that do not signal to your brain that you are full.   Please remember the good carbs are still carbs which convert into sugar. So please measure them out no more than 1/2-1 cup of rice, oatmeal, pasta, and beans  Veggies are however free foods! Pile them on.   Not all fruit is created equal. Please see the list below, the fruit at the bottom is higher in sugars than the fruit at the top. Please avoid all dried fruits.     We want weight loss that will last so you should lose 1-2 pounds a week.  THAT IS IT! Please pick THREE things a month to change. Once it is a habit check off the item. Then pick another three items off the list to become habits.  If you are already doing a habit on the list GREAT!  Cross that item off! o Don't drink your calories. Ie, alcohol, soda, fruit juice, and sweet tea.  o Drink more water. Drink a glass when you feel hungry or before each meal.  o Eat breakfast - Complex carb and protein (likeDannon light and fit yogurt, oatmeal, fruit, eggs, turkey bacon). o Measure your cereal.  Eat no more than one cup a day. (ie Kashi) o Eat an apple a day. o Add a vegetable a day. o Try a new vegetable a month. o Use Pam! Stop using oil or butter to cook. o Don't finish your plate or use smaller plates. o Share your dessert. o Eat sugar free Jello for dessert or frozen grapes. o Don't eat 2-3 hours before bed. o Switch to whole wheat bread, pasta, and brown rice. o Make healthier choices when you eat out. No fries! o Pick baked chicken, NOT fried. o Don't forget to SLOW DOWN when you eat. It is not going anywhere.  o Take the stairs. o Park far away in the parking lot o Lift soup cans (or weights) for 10 minutes while watching TV. o Walk at work for 10 minutes during break. o Walk outside 1 time a week with your friend, kids, dog, or significant other. o Start a walking group at  church. o Walk the mall as much as you can tolerate.  o Keep a food diary. o Weigh yourself daily. o Walk for 15 minutes 3 days per week. o Cook at home more often and eat out less.  If life happens and you go back to old habits, it is okay.  Just start over. You can do it!   If you experience chest pain, get short of breath, or tired during the exercise, please stop immediately and inform your doctor.   Before you even begin to attack a weight-loss plan, it pays to remember this: You are not fat. You have fat. Losing weight isn't about blame or shame; it's simply another achievement to accomplish. Dieting is like any other skill-you have to buckle down and work at it. As long as you act in a smart, reasonable way, you'll ultimately get where you want to be. Here are some weight loss pearls for you.  1. It's Not a Diet. It's a Lifestyle Thinking of a diet as something you're on and suffering through only for the short term doesn't work. To shed weight and keep it off, you need to make permanent changes to the way you eat. It's OK to indulge occasionally, of course, but if   you cut calories temporarily and then revert to your old way of eating, you'll gain back the weight quicker than you can say yo-yo. Use it to lose it. Research shows that one of the best predictors of long-term weight loss is how many pounds you drop in the first month. For that reason, nutritionists often suggest being stricter for the first two weeks of your new eating strategy to build momentum. Cut out added sugar and alcohol and avoid unrefined carbs. After that, figure out how you can reincorporate them in a way that's healthy and maintainable.  2. There's a Right Way to Exercise Working out burns calories and fat and boosts your metabolism by building muscle. But those trying to lose weight are notorious for overestimating the number of calories they burn and underestimating the amount they take in. Unfortunately, your system  is biologically programmed to hold on to extra pounds and that means when you start exercising, your body senses the deficit and ramps up its hunger signals. If you're not diligent, you'll eat everything you burn and then some. Use it to lose it. Cardio gets all the exercise glory, but strength and interval training are the real heroes. They help you build lean muscle, which in turn increases your metabolism and calorie-burning ability 3. Don't Overreact to Mild Hunger Some people have a hard time losing weight because of hunger anxiety. To them, being hungry is bad-something to be avoided at all costs-so they carry snacks with them and eat when they don't need to. Others eat because they're stressed out or bored. While you never want to get to the point of being ravenous (that's when bingeing is likely to happen), a hunger pang, a craving, or the fact that it's 3:00 p.m. should not send you racing for the vending machine or obsessing about the energy bar in your purse. Ideally, you should put off eating until your stomach is growling and it's difficult to concentrate.  Use it to lose it. When you feel the urge to eat, use the HALT method. Ask yourself, Am I really hungry? Or am I angry or anxious, lonely or bored, or tired? If you're still not certain, try the apple test. If you're truly hungry, an apple should seem delicious; if it doesn't, something else is going on. Or you can try drinking water and making yourself busy, if you are still hungry try a healthy snack.  4. Not All Calories Are Created Equal The mechanics of weight loss are pretty simple: Take in fewer calories than you use for energy. But the kind of food you eat makes all the difference. Processed food that's high in saturated fat and refined starch or sugar can cause inflammation that disrupts the hormone signals that tell your brain you're full. The result: You eat a lot more.  Use it to lose it. Clean up your diet. Swap in whole,  unprocessed foods, including vegetables, lean protein, and healthy fats that will fill you up and give you the biggest nutritional bang for your calorie buck. In a few weeks, as your brain starts receiving regular hunger and fullness signals once again, you'll notice that you feel less hungry overall and naturally start cutting back on the amount you eat.  5. Protein, Produce, and Plant-Based Fats Are Your Weight-Loss Trinity Here's why eating the three Ps regularly will help you drop pounds. Protein fills you up. You need it to build lean muscle, which keeps your metabolism humming so that you can torch   more fat. People in a weight-loss program who ate double the recommended daily allowance for protein (about 110 grams for a 150-pound woman) lost 70 percent of their weight from fat, while people who ate the RDA lost only about 40 percent, one study found. Produce is packed with filling fiber. "It's very difficult to consume too many calories if you're eating a lot of vegetables. Example: Three cups of broccoli is a lot of food, yet only 93 calories. (Fruit is another story. It can be easy to overeat and can contain a lot of calories from sugar, so be sure to monitor your intake.) Plant-based fats like olive oil and those in avocados and nuts are healthy and extra satiating.  Use it to lose it. Aim to incorporate each of the three Ps into every meal and snack. People who eat protein throughout the day are able to keep weight off, according to a study in the American Journal of Clinical Nutrition. In addition to meat, poultry and seafood, good sources are beans, lentils, eggs, tofu, and yogurt. As for fat, keep portion sizes in check by measuring out salad dressing, oil, and nut butters (shoot for one to two tablespoons). Finally, eat veggies or a little fruit at every meal. People who did that consumed 308 fewer calories but didn't feel any hungrier than when they didn't eat more produce.  7. How You Eat Is  As Important As What You Eat In order for your brain to register that you're full, you need to focus on what you're eating. Sit down whenever you eat, preferably at a table. Turn off the TV or computer, put down your phone, and look at your food. Smell it. Chew slowly, and don't put another bite on your fork until you swallow. When women ate lunch this attentively, they consumed 30 percent less when snacking later than those who listened to an audiobook at lunchtime, according to a study in the British Journal of Nutrition. 8. Weighing Yourself Really Works The scale provides the best evidence about whether your efforts are paying off. Seeing the numbers tick up or down or stagnate is motivation to keep going-or to rethink your approach. A 2015 study at [employer] found that daily weigh-ins helped people lose more weight, keep it off, and maintain that loss, even after two years. Use it to lose it. Step on the scale at the same time every day for the best results. If your weight shoots up several pounds from one weigh-in to the next, don't freak out. Eating a lot of salt the night before or having your period is the likely culprit. The number should return to normal in a day or two. It's a steady climb that you need to do something about. 9. Too Much Stress and Too Little Sleep Are Your Enemies When you're tired and frazzled, your body cranks up the production of cortisol, the stress hormone that can cause carb cravings. Not getting enough sleep also boosts your levels of ghrelin, a hormone associated with hunger, while suppressing leptin, a hormone that signals fullness and satiety. People on a diet who slept only five and a half hours a night for two weeks lost 55 percent less fat and were hungrier than those who slept eight and a half hours, according to a study in the [employer] Medical Association Journal. Use it to lose it. Prioritize sleep, aiming for seven hours or more a night, which research  shows helps lower stress. And make sure you're getting   quality zzz's. If a snoring spouse or a fidgety cat wakes you up frequently throughout the night, you may end up getting the equivalent of just four hours of sleep, according to a study from [employer]. Keep pets out of the bedroom, and use a white-noise app to drown out snoring. 10. You Will Hit a plateau-And You Can Bust Through It As you slim down, your body releases much less leptin, the fullness hormone.  If you're not strength training, start right now. Building muscle can raise your metabolism to help you overcome a plateau. To keep your body challenged and burning calories, incorporate new moves and more intense intervals into your workouts or add another sweat session to your weekly routine. Alternatively, cut an extra 100 calories or so a day from your diet. Now that you've lost weight, your body simply doesn't need as much fuel.   

## 2015-02-11 NOTE — Progress Notes (Signed)
Assessment and Plan:  1. Hypertension -Continue medication, monitor blood pressure at home. Continue DASH diet.  Reminder to go to the ER if any CP, SOB, nausea, dizziness, severe HA, changes vision/speech, left arm numbness and tingling and jaw pain.  2. Cholesterol -Continue diet and exercise. Check cholesterol.   3. Prediabetes  -Continue diet and exercise. Check A1C  4. Vitamin D Def - check level and continue medications.   5. Obesity with co morbidities - long discussion about weight loss, diet, and exercise  Continue diet and meds as discussed. Further disposition pending results of labs. Over 30 minutes of exam, counseling, chart review, and critical decision making was performed  Future Appointments Date Time Provider Department Center  05/09/2015 3:00 PM Lucky Cowboy, MD [facility] None    HPI 49 y.o. male  presents for 3 month follow up on hypertension, cholesterol, prediabetes, and vitamin D deficiency.   His blood pressure has been controlled at home, today their BP is BP: 138/88 mmHg  He  Does not workout. He denies chest pain, shortness of breath, dizziness.  He is not on cholesterol medication and denies myalgias. His cholesterol is at goal. The cholesterol last visit was:   Lab Results  Component Value Date   CHOL 185 11/11/2014   HDL 44 11/11/2014   LDLCALC 90 11/11/2014   TRIG 256* 11/11/2014   CHOLHDL 4.2 11/11/2014   He has been working on diet and exercise for prediabetes, and denies paresthesia of the feet, polydipsia, polyuria and visual disturbances. Last A1C in the office was:  Lab Results  Component Value Date   HGBA1C 5.7* 11/11/2014  Patient is on Vitamin D supplement.   Lab Results  Component Value Date   VD25OH 31 11/11/2014  BMI is Body mass index is 35.53 kg/(m^2)., he is working on diet and exercise. Wt Readings from Last 3 Encounters:  02/11/15 260 lb (117.935 kg)  11/11/14 255 lb (115.667 kg)  05/03/14 238 lb 12.8 oz (108.319 kg)     Current Medications:  Current Outpatient Prescriptions on File Prior to Visit  Medication Sig Dispense Refill  . bisoprolol-hydrochlorothiazide (ZIAC) 5-6.25 MG per tablet TAKE 1 TABLET EVERY MORNING FOR BLOOD PRESSURE 90 tablet 4  . Cholecalciferol (VITAMIN D PO) Take 8,000 Int'l Units by mouth daily.    . Omega-3 Fatty Acids (FISH OIL PO) Take 1,000 mg by mouth daily.    . SUMAtriptan (IMITREX) 100 MG tablet   3  . topiramate (TOPAMAX) 100 MG tablet   3   No current facility-administered medications on file prior to visit.   Medical History:  Past Medical History  Diagnosis Date  . Hypertension   . Kidney stones   . Plantar fasciitis   . Prediabetes   . DJD (degenerative joint disease) of knee     Right   Allergies:  Allergies  Allergen Reactions  . Fenofibrate Nausea Only  . Penicillins Other (See Comments)    Unknown     Review of Systems:  Review of Systems  Constitutional: Negative.   HENT: Negative.   Eyes: Negative.   Respiratory: Negative.   Cardiovascular: Negative.   Gastrointestinal: Negative.   Genitourinary: Negative.   Musculoskeletal: Positive for myalgias (occ calf pain in the AM) and back pain. Negative for joint pain, falls and neck pain.  Skin: Negative.   Neurological: Negative.   Endo/Heme/Allergies: Negative.   Psychiatric/Behavioral: Negative.     Family history- Review and unchanged Social history- Review and unchanged Physical Exam: BP  138/88 mmHg  Pulse 68  Temp(Src) 97.7 F (36.5 C)  Resp 16  Ht 5' 11.75" (1.822 m)  Wt 260 lb (117.935 kg)  BMI 35.53 kg/m2 Wt Readings from Last 3 Encounters:  02/11/15 260 lb (117.935 kg)  11/11/14 255 lb (115.667 kg)  05/03/14 238 lb 12.8 oz (108.319 kg)   General Appearance: Well nourished, in no apparent distress. Eyes: PERRLA, EOMs, conjunctiva no swelling or erythema Sinuses: No Frontal/maxillary tenderness ENT/Mouth: Ext aud canals clear, TMs without erythema, bulging. No erythema,  swelling, or exudate on post pharynx.  Tonsils not swollen or erythematous. Hearing normal.  Neck: Supple, thyroid normal.  Respiratory: Respiratory effort normal, BS equal bilaterally without rales, rhonchi, wheezing or stridor.  Cardio: RRR with no MRGs. Brisk peripheral pulses without edema.  Abdomen: Soft, + BS,, obese, Non tender, no guarding, rebound, hernias, masses. Lymphatics: Non tender without lymphadenopathy.  Musculoskeletal: Full ROM, 5/5 strength, Antalgic gait Skin: Warm, dry without rashes, lesions, ecchymosis.  Neuro: Cranial nerves intact. Normal muscle tone, no cerebellar symptoms. Psych: Awake and oriented X 3, normal affect, Insight and Judgment appropriate.    Quentin Mullingollier, Corah Willeford, PA-C 9:20 AM [facility]

## 2015-02-12 LAB — MAGNESIUM: MAGNESIUM: 1.7 mg/dL (ref 1.5–2.5)

## 2015-02-12 LAB — VITAMIN D 25 HYDROXY (VIT D DEFICIENCY, FRACTURES): Vit D, 25-Hydroxy: 26 ng/mL — ABNORMAL LOW (ref 30–100)

## 2015-02-12 LAB — BASIC METABOLIC PANEL WITH GFR
BUN: 18 mg/dL (ref 6–23)
CHLORIDE: 102 meq/L (ref 96–112)
CO2: 25 mEq/L (ref 19–32)
Calcium: 9.4 mg/dL (ref 8.4–10.5)
Creat: 1.2 mg/dL (ref 0.50–1.35)
GFR, EST NON AFRICAN AMERICAN: 71 mL/min
GFR, Est African American: 82 mL/min
Glucose, Bld: 116 mg/dL — ABNORMAL HIGH (ref 70–99)
POTASSIUM: 4.2 meq/L (ref 3.5–5.3)
Sodium: 138 mEq/L (ref 135–145)

## 2015-02-12 LAB — HEPATIC FUNCTION PANEL
ALBUMIN: 4.2 g/dL (ref 3.5–5.2)
ALK PHOS: 94 U/L (ref 39–117)
ALT: 24 U/L (ref 0–53)
AST: 21 U/L (ref 0–37)
Bilirubin, Direct: 0.1 mg/dL (ref 0.0–0.3)
Indirect Bilirubin: 0.4 mg/dL (ref 0.2–1.2)
TOTAL PROTEIN: 6.9 g/dL (ref 6.0–8.3)
Total Bilirubin: 0.5 mg/dL (ref 0.2–1.2)

## 2015-02-12 LAB — LIPID PANEL
Cholesterol: 215 mg/dL — ABNORMAL HIGH (ref 0–200)
HDL: 36 mg/dL — AB (ref >=40)
Total CHOL/HDL Ratio: 6 Ratio
Triglycerides: 535 mg/dL — ABNORMAL HIGH (ref <150)

## 2015-02-12 LAB — TSH: TSH: 1.568 u[IU]/mL (ref 0.350–4.500)

## 2015-02-12 LAB — INSULIN, FASTING: Insulin fasting, serum: 34.8 u[IU]/mL — ABNORMAL HIGH (ref 2.0–19.6)

## 2015-05-09 ENCOUNTER — Encounter: Payer: Self-pay | Admitting: Internal Medicine

## 2015-06-03 ENCOUNTER — Encounter: Payer: Self-pay | Admitting: Internal Medicine

## 2015-06-03 ENCOUNTER — Ambulatory Visit (INDEPENDENT_AMBULATORY_CARE_PROVIDER_SITE_OTHER): Payer: BLUE CROSS/BLUE SHIELD | Admitting: Internal Medicine

## 2015-06-03 VITALS — BP 160/100 | HR 64 | Temp 97.7°F | Resp 16 | Ht 72.0 in | Wt 257.0 lb

## 2015-06-03 DIAGNOSIS — R5383 Other fatigue: Secondary | ICD-10-CM

## 2015-06-03 DIAGNOSIS — Z79899 Other long term (current) drug therapy: Secondary | ICD-10-CM

## 2015-06-03 DIAGNOSIS — Z111 Encounter for screening for respiratory tuberculosis: Secondary | ICD-10-CM

## 2015-06-03 DIAGNOSIS — Z125 Encounter for screening for malignant neoplasm of prostate: Secondary | ICD-10-CM

## 2015-06-03 DIAGNOSIS — Z0000 Encounter for general adult medical examination without abnormal findings: Secondary | ICD-10-CM | POA: Diagnosis not present

## 2015-06-03 DIAGNOSIS — I10 Essential (primary) hypertension: Secondary | ICD-10-CM | POA: Diagnosis not present

## 2015-06-03 DIAGNOSIS — R7303 Prediabetes: Secondary | ICD-10-CM

## 2015-06-03 DIAGNOSIS — E782 Mixed hyperlipidemia: Secondary | ICD-10-CM

## 2015-06-03 DIAGNOSIS — E559 Vitamin D deficiency, unspecified: Secondary | ICD-10-CM | POA: Diagnosis not present

## 2015-06-03 DIAGNOSIS — Z1212 Encounter for screening for malignant neoplasm of rectum: Secondary | ICD-10-CM

## 2015-06-03 DIAGNOSIS — Z6834 Body mass index (BMI) 34.0-34.9, adult: Secondary | ICD-10-CM

## 2015-06-03 LAB — IRON AND TIBC
%SAT: 29 % (ref 20–55)
Iron: 105 ug/dL (ref 42–165)
TIBC: 362 ug/dL (ref 215–435)
UIBC: 257 ug/dL (ref 125–400)

## 2015-06-03 LAB — CBC WITH DIFFERENTIAL/PLATELET
BASOS ABS: 0 10*3/uL (ref 0.0–0.1)
Basophils Relative: 0 % (ref 0–1)
Eosinophils Absolute: 0 10*3/uL (ref 0.0–0.7)
Eosinophils Relative: 1 % (ref 0–5)
HCT: 43.7 % (ref 39.0–52.0)
Hemoglobin: 14.8 g/dL (ref 13.0–17.0)
Lymphocytes Relative: 31 % (ref 12–46)
Lymphs Abs: 1.2 10*3/uL (ref 0.7–4.0)
MCH: 32.3 pg (ref 26.0–34.0)
MCHC: 33.9 g/dL (ref 30.0–36.0)
MCV: 95.4 fL (ref 78.0–100.0)
MONO ABS: 0.3 10*3/uL (ref 0.1–1.0)
MPV: 12.2 fL (ref 8.6–12.4)
Monocytes Relative: 9 % (ref 3–12)
NEUTROS ABS: 2.2 10*3/uL (ref 1.7–7.7)
Neutrophils Relative %: 59 % (ref 43–77)
Platelets: 123 10*3/uL — ABNORMAL LOW (ref 150–400)
RBC: 4.58 MIL/uL (ref 4.22–5.81)
RDW: 13.6 % (ref 11.5–15.5)
WBC: 3.8 10*3/uL — ABNORMAL LOW (ref 4.0–10.5)

## 2015-06-03 LAB — LIPID PANEL
CHOL/HDL RATIO: 5.1 ratio — AB (ref <=5.0)
Cholesterol: 202 mg/dL — ABNORMAL HIGH (ref 125–200)
HDL: 40 mg/dL (ref >=40)
Triglycerides: 404 mg/dL — ABNORMAL HIGH (ref <150)

## 2015-06-03 LAB — BASIC METABOLIC PANEL WITH GFR
BUN: 15 mg/dL (ref 7–25)
CALCIUM: 9.1 mg/dL (ref 8.6–10.3)
CO2: 24 mmol/L (ref 20–31)
CREATININE: 1.18 mg/dL (ref 0.60–1.35)
Chloride: 102 mmol/L (ref 98–110)
GFR, EST AFRICAN AMERICAN: 83 mL/min (ref >=60)
GFR, Est Non African American: 72 mL/min (ref >=60)
GLUCOSE: 125 mg/dL — AB (ref 65–99)
Potassium: 4.3 mmol/L (ref 3.5–5.3)
Sodium: 140 mmol/L (ref 135–146)

## 2015-06-03 LAB — MAGNESIUM: MAGNESIUM: 1.9 mg/dL (ref 1.5–2.5)

## 2015-06-03 LAB — HEPATIC FUNCTION PANEL
ALT: 25 U/L (ref 9–46)
AST: 23 U/L (ref 10–40)
Albumin: 4.4 g/dL (ref 3.6–5.1)
Alkaline Phosphatase: 84 U/L (ref 40–115)
BILIRUBIN DIRECT: 0.1 mg/dL (ref <=0.2)
Indirect Bilirubin: 0.7 mg/dL (ref 0.2–1.2)
Total Bilirubin: 0.8 mg/dL (ref 0.2–1.2)
Total Protein: 6.5 g/dL (ref 6.1–8.1)

## 2015-06-03 LAB — VITAMIN B12: Vitamin B-12: 515 pg/mL (ref 211–911)

## 2015-06-03 LAB — TSH: TSH: 1.101 u[IU]/mL (ref 0.350–4.500)

## 2015-06-03 NOTE — Patient Instructions (Signed)
Recommend Adult Low dose Aspirin or coated  Aspirin 81 mg daily   To reduce risk of Colon Cancer 20 %,   Skin Cancer 26 % ,   Melanoma 46%   and   Pancreatic cancer 60% ++++++++++++++++++ Vitamin D goal is between 70-100.   Please make sure that you are taking your Vitamin D as directed.   It is very important as a natural anti-inflammatory   helping hair, skin, and nails, as well as reducing stroke and heart attack risk.   It helps your bones and helps with mood.  It also decreases numerous cancer risks so please take it as directed.   Low Vit D is associated with a 200-300% higher risk for CANCER   and 200-300% higher risk for HEART   ATTACK  &  STROKE.   .....................................Marland Kitchen  It is also associated with higher death rate at younger ages,   autoimmune diseases like Rheumatoid arthritis, Lupus, Multiple Sclerosis.     Also many other serious conditions, like depression, Alzheimer's  Dementia, infertility, muscle aches, fatigue, fibromyalgia - just to name a few.  +++++++++++++++++++  Recommend the book "The END of DIETING" by Dr Monico Hoar   & the book "The END of DIABETES " by Dr Monico Hoar  At [email or web address] - get book & Audio CD's     Being diabetic has a  300% increased risk for heart attack, stroke, cancer, and alzheimer- type vascular dementia. It is very important that you work harder with diet by avoiding all foods that are white. Avoid white rice (brown & wild rice is OK), white potatoes (sweetpotatoes in moderation is OK), White bread or wheat bread or anything made out of white flour like bagels, donuts, rolls, buns, biscuits, cakes, pastries, cookies, pizza crust, and pasta (made from white flour & egg whites) - vegetarian pasta or spinach or wheat pasta is OK. Multigrain breads like Arnold's or Pepperidge Farm, or multigrain sandwich thins or flatbreads.  Diet, exer.preventm cise and weight loss can reverse and cure diabetes in the early  stages.  Diet, exercise and weight loss is very important in the control and prevention of complications of diabetes which affects every system in your body, ie. Brain - dementia/stroke, eyes - glaucoma/blindness, heart - heart attack/heart failure, kidneys - dialysis, stomach - gastric paralysis, intestines - malabsorption, nerves - severe painful neuritis, circulation - gangrene & loss of a leg(s), and finally cancer and Alzheimers.    I recommend avoid fried & greasy foods,  sweets/candy, white rice (brown or wild rice or Quinoa is OK), white potatoes (sweet potatoes are OK) - anything made from white flour - bagels, doughnuts, rolls, buns, biscuits,white and wheat breads, pizza crust and traditional pasta made of white flour & egg white(vegetarian pasta or spinach or wheat pasta is OK).  Multi-grain bread is OK - like multi-grain flat bread or sandwich thins. Avoid alcohol in excess. Exercise is also important.    Eat all the vegetables you want - avoid meat, especially red meat and dairy - especially cheese.  Cheese is the most concentrated form of trans-fats which is the worst thing to clog up our arteries. Veggie cheese is OK which can be found in the fresh produce section at [facility] or [employer] or [employer]  ++++++++++++++++++++++++++
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
## 2015-06-04 LAB — PSA: PSA: 0.76 ng/mL (ref <=4.00)

## 2015-06-04 LAB — INSULIN, RANDOM: Insulin: 48.9 u[IU]/mL — ABNORMAL HIGH (ref 2.0–19.6)

## 2015-06-04 LAB — URINALYSIS, MICROSCOPIC ONLY
BACTERIA UA: NONE SEEN [HPF]
CRYSTALS: NONE SEEN [HPF]
Casts: NONE SEEN [LPF]
RBC / HPF: NONE SEEN RBC/HPF (ref <=2)
Squamous Epithelial / LPF: NONE SEEN [HPF] (ref <=5)
WBC UA: NONE SEEN WBC/HPF (ref <=5)
Yeast: NONE SEEN [HPF]

## 2015-06-04 LAB — MICROALBUMIN / CREATININE URINE RATIO
CREATININE, URINE: 92.4 mg/dL
Microalb Creat Ratio: 3.2 mg/g (ref 0.0–30.0)
Microalb, Ur: 0.3 mg/dL (ref <2.0)

## 2015-06-04 LAB — HEMOGLOBIN A1C
HEMOGLOBIN A1C: 5.8 % — AB (ref <5.7)
Mean Plasma Glucose: 120 mg/dL — ABNORMAL HIGH (ref <117)

## 2015-06-04 LAB — TESTOSTERONE: TESTOSTERONE: 294 ng/dL — AB (ref 300–890)

## 2015-06-04 LAB — VITAMIN D 25 HYDROXY (VIT D DEFICIENCY, FRACTURES): VIT D 25 HYDROXY: 26 ng/mL — AB (ref 30–100)

## 2015-06-06 ENCOUNTER — Encounter (HOSPITAL_BASED_OUTPATIENT_CLINIC_OR_DEPARTMENT_OTHER): Payer: Self-pay | Admitting: *Deleted

## 2015-06-06 ENCOUNTER — Emergency Department (HOSPITAL_BASED_OUTPATIENT_CLINIC_OR_DEPARTMENT_OTHER)
Admission: EM | Admit: 2015-06-06 | Discharge: 2015-06-07 | Disposition: A | Discharge status: Home / Self Care | Payer: BLUE CROSS/BLUE SHIELD | Attending: Emergency Medicine | Admitting: Emergency Medicine

## 2015-06-06 ENCOUNTER — Emergency Department (HOSPITAL_BASED_OUTPATIENT_CLINIC_OR_DEPARTMENT_OTHER): Payer: BLUE CROSS/BLUE SHIELD

## 2015-06-06 DIAGNOSIS — Z8739 Personal history of other diseases of the musculoskeletal system and connective tissue: Secondary | ICD-10-CM | POA: Insufficient documentation

## 2015-06-06 DIAGNOSIS — Z79899 Other long term (current) drug therapy: Secondary | ICD-10-CM | POA: Insufficient documentation

## 2015-06-06 DIAGNOSIS — S90512A Abrasion, left ankle, initial encounter: Secondary | ICD-10-CM | POA: Diagnosis not present

## 2015-06-06 DIAGNOSIS — W228XXA Striking against or struck by other objects, initial encounter: Secondary | ICD-10-CM | POA: Insufficient documentation

## 2015-06-06 DIAGNOSIS — Z880 Allergy status to penicillin: Secondary | ICD-10-CM | POA: Diagnosis not present

## 2015-06-06 DIAGNOSIS — Z87442 Personal history of urinary calculi: Secondary | ICD-10-CM | POA: Insufficient documentation

## 2015-06-06 DIAGNOSIS — Y998 Other external cause status: Secondary | ICD-10-CM | POA: Diagnosis not present

## 2015-06-06 DIAGNOSIS — Z87891 Personal history of nicotine dependence: Secondary | ICD-10-CM | POA: Diagnosis not present

## 2015-06-06 DIAGNOSIS — I10 Essential (primary) hypertension: Secondary | ICD-10-CM | POA: Diagnosis not present

## 2015-06-06 DIAGNOSIS — Y9389 Activity, other specified: Secondary | ICD-10-CM | POA: Insufficient documentation

## 2015-06-06 DIAGNOSIS — S99912A Unspecified injury of left ankle, initial encounter: Secondary | ICD-10-CM

## 2015-06-06 DIAGNOSIS — Y9289 Other specified places as the place of occurrence of the external cause: Secondary | ICD-10-CM | POA: Diagnosis not present

## 2015-06-06 MED ORDER — NAPROXEN 500 MG PO TABS: 500.0000 mg | ORAL_TABLET | Freq: Two times a day (BID) | ORAL | Status: DC

## 2015-06-06 MED ORDER — TRAMADOL HCL 50 MG PO TABS: 50.0000 mg | ORAL_TABLET | Freq: Four times a day (QID) | ORAL | Status: DC | PRN

## 2015-06-06 NOTE — ED Notes (Signed)
Left ankle injury. He was splitting wood and piece of wood hit him in the ankle.

## 2015-06-06 NOTE — Addendum Note (Signed)
Addended by: Reggy Eye on: 06/06/2015 08:36 AM   Modules accepted: Orders

## 2015-06-06 NOTE — Discharge Instructions (Signed)
Take naproxen as prescribed. Take tramadol as directed. Rest, ice and elevate her ankle. Follow-up with your primary care physician.  Ankle Pain Ankle pain is a common symptom. The bones, cartilage, tendons, and muscles of the ankle joint perform a lot of work each day. The ankle joint holds your body weight and allows you to move around. Ankle pain can occur on either side or back of 1 or both ankles. Ankle pain may be sharp and burning or dull and aching. There may be tenderness, stiffness, redness, or warmth around the ankle. The pain occurs more often when a person walks or puts pressure on the ankle. CAUSES  There are many reasons ankle pain can develop. It is important to work with your caregiver to identify the cause since many conditions can impact the bones, cartilage, muscles, and tendons. Causes for ankle pain include:  Injury, including a break (fracture), sprain, or strain often due to a fall, sports, or a high-impact activity.  Swelling (inflammation) of a tendon (tendonitis).  Achilles tendon rupture.  Ankle instability after repeated sprains and strains.  Poor foot alignment.  Pressure on a nerve (tarsal tunnel syndrome).  Arthritis in the ankle or the lining of the ankle.  Crystal formation in the ankle (gout or pseudogout). DIAGNOSIS  A diagnosis is based on your medical history, your symptoms, results of your physical exam, and results of diagnostic tests. Diagnostic tests may include X-ray exams or a computerized magnetic scan (magnetic resonance imaging, MRI). TREATMENT  Treatment will depend on the cause of your ankle pain and may include:  Keeping pressure off the ankle and limiting activities.  Using crutches or other walking support (a cane or brace).  Using rest, ice, compression, and elevation.  Participating in physical therapy or home exercises.  Wearing shoe inserts or special shoes.  Losing weight.  Taking medications to reduce pain or swelling or  receiving an injection.  Undergoing surgery. HOME CARE INSTRUCTIONS   Only take over-the-counter or prescription medicines for pain, discomfort, or fever as directed by your caregiver.  Put ice on the injured area.  Put ice in a plastic bag.  Place a towel between your skin and the bag.  Leave the ice on for 15-20 minutes at a time, 03-04 times a day.  Keep your leg raised (elevated) when possible to lessen swelling.  Avoid activities that cause ankle pain.  Follow specific exercises as directed by your caregiver.  Record how often you have ankle pain, the location of the pain, and what it feels like. This information may be helpful to you and your caregiver.  Ask your caregiver about returning to work or sports and whether you should drive.  Follow up with your caregiver for further examination, therapy, or testing as directed. SEEK MEDICAL CARE IF:   Pain or swelling continues or worsens beyond 1 week.  You have an oral temperature above 102 F (38.9 C).  You are feeling unwell or have chills.  You are having an increasingly difficult time with walking.  You have loss of sensation or other new symptoms.  You have questions or concerns. MAKE SURE YOU:   Understand these instructions.  Will watch your condition.  Will get help right away if you are not doing well or get worse. Document Released: 03/28/2010 Document Revised: 12/31/2011 Document Reviewed: 03/28/2010 [facility] Patient Information 2015 [place], [place]. This information is not intended to replace advice given to you by your health care provider. Make sure you discuss any questions  you have with your health care provider.  Abrasion An abrasion is a cut or scrape of the skin. Abrasions do not extend through all layers of the skin and most heal within 10 days. It is important to care for your abrasion properly to prevent infection. CAUSES  Most abrasions are caused by falling on, or gliding across, the  ground or other surface. When your skin rubs on something, the outer and inner layer of skin rubs off, causing an abrasion. DIAGNOSIS  Your caregiver will be able to diagnose an abrasion during a physical exam.  TREATMENT  Your treatment depends on how large and deep the abrasion is. Generally, your abrasion will be cleaned with water and a mild soap to remove any dirt or debris. An antibiotic ointment may be put over the abrasion to prevent an infection. A bandage (dressing) may be wrapped around the abrasion to keep it from getting dirty.  You may need a tetanus shot if:  You cannot remember when you had your last tetanus shot.  You have never had a tetanus shot.  The injury broke your skin. If you get a tetanus shot, your arm may swell, get red, and feel warm to the touch. This is common and not a problem. If you need a tetanus shot and you choose not to have one, there is a rare chance of getting tetanus. Sickness from tetanus can be serious.  HOME CARE INSTRUCTIONS   If a dressing was applied, change it at least once a day or as directed by your caregiver. If the bandage sticks, soak it off with warm water.   Wash the area with water and a mild soap to remove all the ointment 2 times a day. Rinse off the soap and pat the area dry with a clean towel.   Reapply any ointment as directed by your caregiver. This will help prevent infection and keep the bandage from sticking. Use gauze over the wound and under the dressing to help keep the bandage from sticking.   Change your dressing right away if it becomes wet or dirty.   Only take over-the-counter or prescription medicines for pain, discomfort, or fever as directed by your caregiver.   Follow up with your caregiver within 24-48 hours for a wound check, or as directed. If you were not given a wound-check appointment, look closely at your abrasion for redness, swelling, or pus. These are signs of infection. SEEK IMMEDIATE MEDICAL CARE  IF:   You have increasing pain in the wound.   You have redness, swelling, or tenderness around the wound.   You have pus coming from the wound.   You have a fever or persistent symptoms for more than 2-3 days.  You have a fever and your symptoms suddenly get worse.  You have a bad smell coming from the wound or dressing.  MAKE SURE YOU:   Understand these instructions.  Will watch your condition.  Will get help right away if you are not doing well or get worse. Document Released: 07/18/2005 Document Revised: 09/24/2012 Document Reviewed: 09/11/2011 [facility] Patient Information 2015 [place], [place]. This information is not intended to replace advice given to you by your health care provider. Make sure you discuss any questions you have with your health care provider.

## 2015-06-06 NOTE — ED Notes (Signed)
Wound cleansed and re-dressed. 

## 2015-06-06 NOTE — ED Provider Notes (Signed)
CSN: [id number]     Arrival date & time 06/06/15  2152 History   First MD Initiated Contact with Patient 06/06/15 2321     Chief Complaint  Patient presents with  . Ankle Injury     (Consider location/radiation/quality/duration/timing/severity/associated sxs/prior Treatment) HPI Comments: 49 year old male presenting with a left ankle injury occurring about an hour prior to arrival. Patient was splitting wood and a piece of wood accidentally hit him in the ankle causing immediate pain. There is a small cut to his ankle. No alleviating factors. Pain increased with walking. Had a physical last week and was told his tetanus was up-to-date. Denies numbness or tingling.  Patient is a 49 y.o. male presenting with lower extremity injury. The history is provided by the patient.  Ankle Injury This is a new problem. The current episode started today. The problem occurs constantly. The problem has been unchanged. Pertinent negatives include no fever or numbness. The symptoms are aggravated by walking. He has tried nothing for the symptoms.    Past Medical History  Diagnosis Date  . Hypertension   . Kidney stones   . Plantar fasciitis   . Prediabetes   . DJD (degenerative joint disease) of knee     Right   Past Surgical History  Procedure Laterality Date  . Knee arthroscopy Right 1987   No family history on file. Social History  Substance Use Topics  . Smoking status: Former Smoker    Quit date: 07/22/2002  . Smokeless tobacco: None  . Alcohol Use: No    Review of Systems  Constitutional: Negative for fever.  Musculoskeletal:       + L ankle pain.  Skin: Positive for wound.  Neurological: Negative for numbness.      Allergies  Fenofibrate and Penicillins  Home Medications   Prior to Admission medications   Medication Sig Start Date End Date Taking? Authorizing Provider  bisoprolol-hydrochlorothiazide [facility]) 5-6.25 MG per tablet TAKE 1 TABLET EVERY MORNING FOR BLOOD PRESSURE  02/11/15   Quentin Mulling, PA-C  Cholecalciferol (VITAMIN D PO) Take 8,000 Int'l Units by mouth daily.    Historical Provider, MD  naproxen (NAPROSYN) 500 MG tablet Take 1 tablet (500 mg total) by mouth 2 (two) times daily. 06/06/15   Kathrynn Speed, PA-C  SUMAtriptan (IMITREX) 100 MG tablet  10/07/14   Historical Provider, MD  tiZANidine (ZANAFLEX) 4 MG tablet Take by mouth at bedtime. 05/02/15   Historical Provider, MD  topiramate (TOPAMAX) 100 MG tablet  10/07/14   Historical Provider, MD  traMADol (ULTRAM) 50 MG tablet Take 1 tablet (50 mg total) by mouth every 6 (six) hours as needed. 06/06/15   Edward Guthmiller M Domanik [place], PA-C   BP 173/98 mmHg  Pulse 82  Temp(Src) 98.2 F (36.8 C) (Oral)  Resp 20  Ht 6' 0.5" (1.842 m)  Wt 255 lb (115.667 kg)  BMI 34.09 kg/m2  SpO2 95% Physical Exam  Constitutional: He is oriented to person, place, and time. He appears well-developed and well-nourished. No distress.  HENT:  Head: Normocephalic and atraumatic.  Eyes: Conjunctivae and EOM are normal.  Neck: Normal range of motion. Neck supple.  Cardiovascular: Normal rate, regular rhythm and normal heart sounds.   Pulmonary/Chest: Effort normal and breath sounds normal.  Musculoskeletal:  4 cm area diameter of swelling to anterior aspect of left ankle with central abrasion. Tender. Ankle range of motion limited due to pain anterior. Small abrasion to the tip of left big toe. No swelling. +2 PT/DP pulse.  Cap refill < 2 seconds.  Neurological: He is alert and oriented to person, place, and time.  Skin: Skin is warm and dry.  Psychiatric: He has a normal mood and affect. His behavior is normal.  Nursing note and vitals reviewed.   ED Course  Procedures (including critical care time) Labs Review Labs Reviewed - No data to display  Imaging Review Dg Ankle Complete Left  06/06/2015   CLINICAL DATA:  Hit by piece of wood on medial aspect of left ankle. Initial encounter.  EXAM: LEFT ANKLE COMPLETE - 3+ VIEW   COMPARISON:  None.  FINDINGS: There is no evidence of fracture or dislocation. The ankle mortise is intact; the interosseous space is within normal limits. No talar tilt or subluxation is seen. A plantar calcaneal spur is noted.  The joint spaces are preserved. No significant soft tissue abnormalities are seen.  IMPRESSION: No evidence of fracture or dislocation.   Electronically Signed   By: Roanna Raider M.D.   On: 06/06/2015 22:46   I, Celene Skeen, personally reviewed and evaluated these images and lab results as part of my medical decision-making.   EKG Interpretation None      MDM   Final diagnoses:  Ankle abrasion, left, initial encounter  Left ankle injury, initial encounter   Neurovascularly intact. X-ray negative. There is an abrasion, not a laceration. Tetanus up-to-date. Wound care given. Advised rice and NSAIDs. Crutches given for comfort. Follow-up with PCP. Stable for discharge. Return precautions given. Patient states understanding of treatment care plan and is agreeable.   Kathrynn Speed, PA-C 06/06/15 2350  Paula Libra, MD 06/07/15 0700

## 2015-06-08 ENCOUNTER — Encounter: Payer: Self-pay | Admitting: *Deleted

## 2015-10-04 ENCOUNTER — Ambulatory Visit: Payer: Self-pay | Admitting: Internal Medicine

## 2015-12-16 ENCOUNTER — Encounter: Payer: Self-pay | Admitting: Internal Medicine

## 2015-12-16 ENCOUNTER — Ambulatory Visit (INDEPENDENT_AMBULATORY_CARE_PROVIDER_SITE_OTHER): Payer: BLUE CROSS/BLUE SHIELD | Admitting: Internal Medicine

## 2015-12-16 VITALS — BP 142/88 | HR 80 | Temp 97.9°F | Resp 16 | Ht 72.0 in | Wt 275.2 lb

## 2015-12-16 DIAGNOSIS — M549 Dorsalgia, unspecified: Secondary | ICD-10-CM | POA: Diagnosis not present

## 2015-12-16 DIAGNOSIS — E782 Mixed hyperlipidemia: Secondary | ICD-10-CM | POA: Diagnosis not present

## 2015-12-16 DIAGNOSIS — R7303 Prediabetes: Secondary | ICD-10-CM

## 2015-12-16 DIAGNOSIS — I10 Essential (primary) hypertension: Secondary | ICD-10-CM

## 2015-12-16 DIAGNOSIS — E559 Vitamin D deficiency, unspecified: Secondary | ICD-10-CM | POA: Diagnosis not present

## 2015-12-16 DIAGNOSIS — Z79899 Other long term (current) drug therapy: Secondary | ICD-10-CM

## 2015-12-16 LAB — BASIC METABOLIC PANEL WITH GFR
BUN: 21 mg/dL (ref 7–25)
CO2: 24 mmol/L (ref 20–31)
CREATININE: 1.13 mg/dL (ref 0.70–1.33)
Calcium: 9.5 mg/dL (ref 8.6–10.3)
Chloride: 103 mmol/L (ref 98–110)
GFR, EST AFRICAN AMERICAN: 87 mL/min (ref >=60)
GFR, Est Non African American: 75 mL/min (ref >=60)
GLUCOSE: 92 mg/dL (ref 65–99)
Potassium: 4.4 mmol/L (ref 3.5–5.3)
Sodium: 137 mmol/L (ref 135–146)

## 2015-12-16 LAB — CBC WITH DIFFERENTIAL/PLATELET
Basophils Absolute: 0 10*3/uL (ref 0.0–0.1)
Basophils Relative: 0 % (ref 0–1)
EOS ABS: 0.1 10*3/uL (ref 0.0–0.7)
EOS PCT: 1 % (ref 0–5)
HEMATOCRIT: 41.9 % (ref 39.0–52.0)
Hemoglobin: 14.5 g/dL (ref 13.0–17.0)
LYMPHS PCT: 28 % (ref 12–46)
Lymphs Abs: 1.4 10*3/uL (ref 0.7–4.0)
MCH: 32.7 pg (ref 26.0–34.0)
MCHC: 34.6 g/dL (ref 30.0–36.0)
MCV: 94.6 fL (ref 78.0–100.0)
MONO ABS: 0.6 10*3/uL (ref 0.1–1.0)
MPV: 11.8 fL (ref 8.6–12.4)
Monocytes Relative: 12 % (ref 3–12)
Neutro Abs: 3 10*3/uL (ref 1.7–7.7)
Neutrophils Relative %: 59 % (ref 43–77)
PLATELETS: 115 10*3/uL — AB (ref 150–400)
RBC: 4.43 MIL/uL (ref 4.22–5.81)
RDW: 14.1 % (ref 11.5–15.5)
WBC: 5 10*3/uL (ref 4.0–10.5)

## 2015-12-16 LAB — LIPID PANEL
Cholesterol: 221 mg/dL — ABNORMAL HIGH (ref 125–200)
HDL: 38 mg/dL — ABNORMAL LOW (ref >=40)
Total CHOL/HDL Ratio: 5.8 Ratio — ABNORMAL HIGH (ref <=5.0)
Triglycerides: 564 mg/dL — ABNORMAL HIGH (ref <150)

## 2015-12-16 LAB — HEPATIC FUNCTION PANEL
ALBUMIN: 4.5 g/dL (ref 3.6–5.1)
ALK PHOS: 101 U/L (ref 40–115)
ALT: 37 U/L (ref 9–46)
AST: 26 U/L (ref 10–35)
Bilirubin, Direct: 0.1 mg/dL (ref <=0.2)
Indirect Bilirubin: 0.6 mg/dL (ref 0.2–1.2)
TOTAL PROTEIN: 6.9 g/dL (ref 6.1–8.1)
Total Bilirubin: 0.7 mg/dL (ref 0.2–1.2)

## 2015-12-16 LAB — MAGNESIUM: MAGNESIUM: 1.8 mg/dL (ref 1.5–2.5)

## 2015-12-16 LAB — HEMOGLOBIN A1C
Hgb A1c MFr Bld: 5.5 % (ref <5.7)
MEAN PLASMA GLUCOSE: 111 mg/dL (ref <117)

## 2015-12-16 MED ORDER — DICLOFENAC SODIUM 1 % TD GEL: 4.0000 g | Freq: Four times a day (QID) | TRANSDERMAL | Status: DC
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
## 2015-12-16 NOTE — Patient Instructions (Signed)

## 2015-12-17 ENCOUNTER — Other Ambulatory Visit: Payer: Self-pay | Admitting: Internal Medicine

## 2015-12-17 DIAGNOSIS — E782 Mixed hyperlipidemia: Secondary | ICD-10-CM

## 2015-12-17 LAB — TSH: TSH: 1.72 m[IU]/L (ref 0.40–4.50)

## 2015-12-17 LAB — VITAMIN D 25 HYDROXY (VIT D DEFICIENCY, FRACTURES): Vit D, 25-Hydroxy: 16 ng/mL — ABNORMAL LOW (ref 30–100)

## 2015-12-17 MED ORDER — ATORVASTATIN CALCIUM 80 MG PO TABS: ORAL_TABLET | ORAL | Status: DC

## 2015-12-19 LAB — INSULIN, RANDOM: INSULIN: 14.9 u[IU]/mL (ref 2.0–19.6)

## 2016-02-22 ENCOUNTER — Other Ambulatory Visit: Payer: Self-pay | Admitting: Physician Assistant

## 2016-04-03 ENCOUNTER — Ambulatory Visit: Payer: Self-pay | Admitting: Internal Medicine

## 2016-06-26 ENCOUNTER — Encounter: Payer: Self-pay | Admitting: Internal Medicine

## 2016-06-26 ENCOUNTER — Ambulatory Visit (INDEPENDENT_AMBULATORY_CARE_PROVIDER_SITE_OTHER): Payer: BLUE CROSS/BLUE SHIELD | Admitting: Internal Medicine

## 2016-06-26 VITALS — BP 148/94 | HR 80 | Temp 97.9°F | Resp 16 | Ht 72.0 in | Wt 279.6 lb

## 2016-06-26 DIAGNOSIS — R5383 Other fatigue: Secondary | ICD-10-CM

## 2016-06-26 DIAGNOSIS — Z136 Encounter for screening for cardiovascular disorders: Secondary | ICD-10-CM

## 2016-06-26 DIAGNOSIS — I10 Essential (primary) hypertension: Secondary | ICD-10-CM | POA: Diagnosis not present

## 2016-06-26 DIAGNOSIS — Z0000 Encounter for general adult medical examination without abnormal findings: Secondary | ICD-10-CM

## 2016-06-26 DIAGNOSIS — E559 Vitamin D deficiency, unspecified: Secondary | ICD-10-CM

## 2016-06-26 DIAGNOSIS — Z1212 Encounter for screening for malignant neoplasm of rectum: Secondary | ICD-10-CM

## 2016-06-26 DIAGNOSIS — R7303 Prediabetes: Secondary | ICD-10-CM

## 2016-06-26 DIAGNOSIS — Z0001 Encounter for general adult medical examination with abnormal findings: Secondary | ICD-10-CM

## 2016-06-26 DIAGNOSIS — Z111 Encounter for screening for respiratory tuberculosis: Secondary | ICD-10-CM | POA: Diagnosis not present

## 2016-06-26 DIAGNOSIS — E782 Mixed hyperlipidemia: Secondary | ICD-10-CM

## 2016-06-26 DIAGNOSIS — R569 Unspecified convulsions: Secondary | ICD-10-CM

## 2016-06-26 DIAGNOSIS — Z125 Encounter for screening for malignant neoplasm of prostate: Secondary | ICD-10-CM

## 2016-06-26 DIAGNOSIS — Z79899 Other long term (current) drug therapy: Secondary | ICD-10-CM

## 2016-06-26 LAB — CBC WITH DIFFERENTIAL/PLATELET
Basophils Absolute: 0 cells/uL (ref 0–200)
Basophils Relative: 0 %
EOS PCT: 2 %
Eosinophils Absolute: 84 cells/uL (ref 15–500)
HEMATOCRIT: 40.9 % (ref 38.5–50.0)
Hemoglobin: 14 g/dL (ref 13.2–17.1)
LYMPHS PCT: 40 %
Lymphs Abs: 1680 cells/uL (ref 850–3900)
MCH: 32.9 pg (ref 27.0–33.0)
MCHC: 34.2 g/dL (ref 32.0–36.0)
MCV: 96.2 fL (ref 80.0–100.0)
MPV: 11.8 fL (ref 7.5–12.5)
Monocytes Absolute: 504 cells/uL (ref 200–950)
Monocytes Relative: 12 %
NEUTROS PCT: 46 %
Neutro Abs: 1932 cells/uL (ref 1500–7800)
Platelets: 110 10*3/uL — ABNORMAL LOW (ref 140–400)
RBC: 4.25 MIL/uL (ref 4.20–5.80)
RDW: 13.7 % (ref 11.0–15.0)
WBC: 4.2 10*3/uL (ref 3.8–10.8)

## 2016-06-26 LAB — VITAMIN B12: Vitamin B-12: 583 pg/mL (ref 200–1100)

## 2016-06-26 LAB — PSA: PSA: 1.1 ng/mL (ref <=4.0)

## 2016-06-26 LAB — TSH: TSH: 1.99 mIU/L (ref 0.40–4.50)

## 2016-06-26 NOTE — Progress Notes (Signed)
[facility]   Spencer Humphrey, M.D.    Dyanne Carrel. Steffanie Dunn, P.A.-C      Terri Piedra, P.A.-C  [facility]                [address]                [place], [place]. [address] Telephone [phone] Telefax [phone]  Annual  Screening/Preventative Visit  & Comprehensive Evaluation & Examination     This very nice 50y.o.MWM presents for a Wellness/Preventative Visit & comprehensive evaluation and management of multiple medical co-morbidities.  Patient has been followed for HTN, Severe Morbid Obesity, Prediabetes, Hyperlipidemia and Vitamin D Deficiency.     Patient has hx/o Labile HTN and alleges random BP's at home are usually normal. Patient has been on treatment since 2002. Today's BP is elevated at 148/94.  Patient denies any cardiac symptoms as chest pain, palpitations, shortness of breath, dizziness or ankle swelling.     Patient's hyperlipidemia is controlled with diet and medications. Patient denies myalgias or other medication SE's. Last lipids were not at goal and patient was started on Atorvastatin: Lab Results  Component Value Date   CHOL 221 (H) 12/16/2015   HDL 38 (L) 12/16/2015   LDLCALC NOT CALC 12/16/2015   TRIG 564 (H) 12/16/2015   CHOLHDL 5.8 (H) 12/16/2015      Patient has Morbid Obesity (BMI 37+) and has prediabetes with A1c 5.9% since 2014 and patient denies reactive hypoglycemic symptoms, visual blurring, diabetic polys or paresthesias. Last A1c was normal & at goal: Lab Results  Component Value Date   HGBA1C 5.5 12/16/2015       Finally, patient has history of Vitamin D Deficiency in 2013 of "37" and last vitamin D was even lower and he was started on Vit D 8,000 units: Lab Results  Component Value Date   VD25OH 16 (L) 12/16/2015   Current Outpatient Prescriptions on File Prior to Visit  Medication Sig  . atorvastatin  80 MG tablet Take 1/2 to 1 tablet daily or as directed for  cholesterol  . bisoprolol-hctz (ZIAC) 5-6.25  TAKE 1 TABLET EVERY MORNING FOR BLOOD PRESSURE  . VITAMIN D Take 8,000 Int'l Units by mouth daily.  . diclofenac  (VOLTAREN) 1 % GEL Apply 4 g topically 4 (four) times daily.  . SUMAtriptan (IMITREX) 100 MG   Not Taking  . topiramate (TOPAMAX) 100 MG  Not Taking    Allergies  Allergen Reactions  . Fenofibrate Nausea Only  . Penicillins    Past Medical History:  Diagnosis Date  . 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 Hypertension   . Kidney stones   . Plantar fasciitis   . Prediabetes    Health Maintenance  Topic Date Due  . COLONOSCOPY  11/03/2015  . INFLUENZA VACCINE  05/22/2016  . TETANUS/TDAP  03/13/2023  . HIV Screening  Completed   Immunization History  Administered Date(s) Administered  . PPD Test 05/03/2014, 06/03/2015, 06/26/2016  . Pneumococcal Polysaccharide-23 03/12/2013  . Tdap 03/12/2013   Past Surgical History:  Procedure Laterality Date  . KNEE ARTHROSCOPY Right 1987   History reviewed. No pertinent family history.  Social History   Social History  . Marital status: Single    Spouse name: N/A  . Number of children: N/A  . Years of education: N/A   Occupational History  . Not on file.   Social History Main Topics  .  Smoking status: Former Smoker    Quit date: 07/22/2002  . Smokeless tobacco: Not on file  . Alcohol use No  . Drug use: No  . Sexual activity: Active    ROS Constitutional: Denies fever, chills, weight loss/gain, headaches, insomnia,  night sweats or change in appetite. Does c/o fatigue. Eyes: Denies redness, blurred vision, diplopia, discharge, itchy or watery eyes.  ENT: Denies discharge, congestion, post nasal drip, epistaxis, sore throat, earache, hearing loss, dental pain, Tinnitus, Vertigo, Sinus pain or snoring.  Cardio: Denies chest pain, palpitations, irregular heartbeat, syncope, dyspnea, diaphoresis, orthopnea, PND, claudication or edema Respiratory: denies  cough, dyspnea, DOE, pleurisy, hoarseness, laryngitis or wheezing.  Gastrointestinal: Denies dysphagia, heartburn, reflux, water brash, pain, cramps, nausea, vomiting, bloating, diarrhea, constipation, hematemesis, melena, hematochezia, jaundice or hemorrhoids Genitourinary: Denies dysuria, frequency, urgency, nocturia, hesitancy, discharge, hematuria or flank pain Musculoskeletal: Denies arthralgia, myalgia, stiffness, Jt. Swelling, pain, limp or strain/sprain. Denies Falls. Skin: Denies puritis, rash, hives, warts, acne, eczema or change in skin lesion Neuro: No weakness, tremor, incoordination, spasms, paresthesia or pain Psychiatric: Denies confusion, memory loss or sensory loss. Denies Depression. Endocrine: Denies change in weight, skin, hair change, nocturia, and paresthesia, diabetic polys, visual blurring or hyper / hypo glycemic episodes.  Heme/Lymph: No excessive bleeding, bruising or enlarged lymph nodes.  Physical Exam  BP (!) 148/94   Pulse 80   Temp 97.9 F (36.6 C)   Resp 16   Ht 6' (1.829 m)   Wt 279 lb 9.6 oz (126.8 kg)   BMI 37.92 kg/m   General Appearance: Over nourished, in no apparent distress.  Eyes: PERRLA, EOMs, conjunctiva no swelling or erythema, normal fundi and vessels. Sinuses: No frontal/maxillary tenderness ENT/Mouth: EACs patent / TMs  nl. Nares clear without erythema, swelling, mucoid exudates. Oral hygiene is good. No erythema, swelling, or exudate. Tongue normal, non-obstructing. Tonsils not swollen or erythematous. Hearing normal.  Neck: Supple, thyroid normal. No bruits, nodes or JVD. Respiratory: Respiratory effort normal.  BS equal and clear bilateral without rales, rhonci, wheezing or stridor. Cardio: Heart sounds are normal with regular rate and rhythm and no murmurs, rubs or gallops. Peripheral pulses are normal and equal bilaterally without edema. No aortic or femoral bruits. Chest: symmetric with normal excursions and percussion.  Abdomen:  Soft, rotund with Nl bowel sounds. Nontender, no guarding, rebound, hernias, masses, or organomegaly.  Lymphatics: Non tender without lymphadenopathy.  Genitourinary: No hernias.Testes nl.  DRE - prostate nl for age - smooth & firm w/o nodules. Musculoskeletal: Full ROM all peripheral extremities, joint stability, 5/5 strength, and normal gait. Skin: Warm and dry without rashes, lesions, cyanosis, clubbing or  ecchymosis.  Neuro: Cranial nerves intact, reflexes equal bilaterally. Normal muscle tone, no cerebellar symptoms. Sensation intact.  Pysch: Alert and oriented X 3 with normal affect, insight and judgment appropriate.   Assessment and Plan  1. Annual Preventative/Screening Exam   - CBC with Differential/Platelet - BASIC METABOLIC PANEL WITH GFR - Hepatic function panel - Magnesium - Lipid panel - TSH - Hemoglobin A1c - Insulin, random - VITAMIN D 25 Hydroxy  - Microalbumin / creatinine urine ratio - EKG 12-Lead - [place], RETROPERITNL ABD,  LTD - POC Hemoccult Bld/Stl  - Urinalysis, Routine w reflex microscopic - Vitamin B12 - Iron and TIBC - PSA - Testosterone  2. Essential hypertension  - TSH - Microalbumin / creatinine urine ratio - EKG 12-Lead - [place], RETROPERITNL ABD,  LTD  3. Hyperlipidemia  - Lipid panel - TSH - EKG 12-Lead -  US, RETROPERITNL ABD,  LTD  4. Prediabetes  - Hemoglobin A1c - Insulin, random - EKG 12-Lead - US, RETROPERITNL ABD,  LTD  5. Vitamin D deficiency  - VITAMIN D 25 Hydroxy   6. Morbid obesity due to excess calories (HCC)   7. Screening for rectal cancer  - POC Hemoccult Bld/Stl   8. Prostate cancer screening  - PSA  9. Other fatigue  - TSH - Vitamin B12 - Iron and TIBC - Testosterone  10. Seizure (HCC)   11. Screening for ischemic heart disease  - EKG 12-Lead  12. Screening for AAA (aortic abdominal aneurysm)  - US, RETROPERITNL ABD,  LTD  13. Medication management  - CBC with Differential/Platelet -  BASIC METABOLIC PANEL WITH GFR - Hepatic function panel - Magnesium - Urinalysis, Routine w reflex microscopic   14. Screening examination for pulmonary tuberculosis  - PPD     Continue prudent diet as discussed, weight control, BP monitoring, regular exercise, and medications as discussed.  Discussed med effects and SE's. Routine screening labs and tests as requested with regular follow-up as recommended. Over 40 minutes of exam, counseling, chart review and high complex critical decision making was performed

## 2016-06-26 NOTE — Patient Instructions (Signed)

## 2016-06-27 ENCOUNTER — Other Ambulatory Visit: Payer: Self-pay | Admitting: Internal Medicine

## 2016-06-27 LAB — URINALYSIS, ROUTINE W REFLEX MICROSCOPIC
BILIRUBIN URINE: NEGATIVE
GLUCOSE, UA: NEGATIVE
HGB URINE DIPSTICK: NEGATIVE
KETONES UR: NEGATIVE
Leukocytes, UA: NEGATIVE
Nitrite: NEGATIVE
PH: 6 (ref 5.0–8.0)
Protein, ur: NEGATIVE
SPECIFIC GRAVITY, URINE: 1.024 (ref 1.001–1.035)

## 2016-06-27 LAB — IRON AND TIBC
%SAT: 34 % (ref 15–60)
Iron: 114 ug/dL (ref 50–180)
TIBC: 337 ug/dL (ref 250–425)
UIBC: 223 ug/dL (ref 125–400)

## 2016-06-27 LAB — LIPID PANEL
CHOLESTEROL: 155 mg/dL (ref 125–200)
HDL: 36 mg/dL — ABNORMAL LOW (ref >=40)
TRIGLYCERIDES: 696 mg/dL — AB (ref <150)
Total CHOL/HDL Ratio: 4.3 Ratio (ref <=5.0)

## 2016-06-27 LAB — HEPATIC FUNCTION PANEL
ALBUMIN: 4.3 g/dL (ref 3.6–5.1)
ALT: 48 U/L — ABNORMAL HIGH (ref 9–46)
AST: 51 U/L — ABNORMAL HIGH (ref 10–35)
Alkaline Phosphatase: 75 U/L (ref 40–115)
BILIRUBIN INDIRECT: 0.5 mg/dL (ref 0.2–1.2)
Bilirubin, Direct: 0.1 mg/dL (ref <=0.2)
TOTAL PROTEIN: 6.5 g/dL (ref 6.1–8.1)
Total Bilirubin: 0.6 mg/dL (ref 0.2–1.2)

## 2016-06-27 LAB — BASIC METABOLIC PANEL WITH GFR
BUN: 21 mg/dL (ref 7–25)
CALCIUM: 8.5 mg/dL — AB (ref 8.6–10.3)
CO2: 27 mmol/L (ref 20–31)
CREATININE: 1.36 mg/dL — AB (ref 0.70–1.33)
Chloride: 102 mmol/L (ref 98–110)
GFR, EST AFRICAN AMERICAN: 70 mL/min (ref >=60)
GFR, EST NON AFRICAN AMERICAN: 60 mL/min (ref >=60)
GLUCOSE: 126 mg/dL — AB (ref 65–99)
Potassium: 4.1 mmol/L (ref 3.5–5.3)
SODIUM: 140 mmol/L (ref 135–146)

## 2016-06-27 LAB — VITAMIN D 25 HYDROXY (VIT D DEFICIENCY, FRACTURES): VIT D 25 HYDROXY: 24 ng/mL — AB (ref 30–100)

## 2016-06-27 LAB — HEMOGLOBIN A1C
Hgb A1c MFr Bld: 5.7 % — ABNORMAL HIGH (ref <5.7)
MEAN PLASMA GLUCOSE: 117 mg/dL

## 2016-06-27 LAB — MICROALBUMIN / CREATININE URINE RATIO
CREATININE, URINE: 200 mg/dL (ref 20–370)
MICROALB UR: 0.7 mg/dL
Microalb Creat Ratio: 4 mcg/mg creat (ref <30)

## 2016-06-27 LAB — MAGNESIUM: MAGNESIUM: 1.9 mg/dL (ref 1.5–2.5)

## 2016-06-27 LAB — INSULIN, RANDOM: INSULIN: 39.6 u[IU]/mL — AB (ref 2.0–19.6)

## 2016-06-27 LAB — TESTOSTERONE: TESTOSTERONE: 143 ng/dL — AB (ref 250–827)

## 2016-06-29 LAB — TB SKIN TEST
Induration: 0 mm
TB SKIN TEST: NEGATIVE

## 2016-07-20 ENCOUNTER — Encounter: Payer: Self-pay | Admitting: *Deleted

## 2016-10-01 ENCOUNTER — Ambulatory Visit: Payer: Self-pay | Admitting: Internal Medicine

## 2016-10-10 ENCOUNTER — Encounter: Payer: Self-pay | Admitting: Internal Medicine

## 2016-10-10 ENCOUNTER — Ambulatory Visit (INDEPENDENT_AMBULATORY_CARE_PROVIDER_SITE_OTHER): Payer: BLUE CROSS/BLUE SHIELD | Admitting: Internal Medicine

## 2016-10-10 VITALS — BP 142/86 | HR 90 | Temp 97.8°F | Resp 16 | Ht 72.0 in | Wt 270.0 lb

## 2016-10-10 DIAGNOSIS — E782 Mixed hyperlipidemia: Secondary | ICD-10-CM

## 2016-10-10 DIAGNOSIS — R7303 Prediabetes: Secondary | ICD-10-CM | POA: Diagnosis not present

## 2016-10-10 DIAGNOSIS — I10 Essential (primary) hypertension: Secondary | ICD-10-CM | POA: Diagnosis not present

## 2016-10-10 DIAGNOSIS — Z79899 Other long term (current) drug therapy: Secondary | ICD-10-CM | POA: Diagnosis not present

## 2016-10-10 DIAGNOSIS — E559 Vitamin D deficiency, unspecified: Secondary | ICD-10-CM | POA: Diagnosis not present

## 2016-10-10 NOTE — Progress Notes (Signed)
Assessment and Plan:  Hypertension:  -Continue medication -monitor blood pressure at home. -Continue DASH diet -Reminder to go to the ER if any CP, SOB, nausea, dizziness, severe HA, changes vision/speech, left arm numbness and tingling and jaw pain.  Cholesterol - Continue diet and exercise -Continue lipitor  PreDiabetes without complications -Continue diet and exercise.   Vitamin D Def -continue medications.   Continue diet and meds as discussed. Further disposition pending results of labs. Discussed med's effects and SE's.    HPI 50 y.o. male  presents for 3 month follow up with hypertension, hyperlipidemia, diabetes and vitamin D deficiency.   His blood pressure has been controlled at home, today their BP is BP: (!) 142/86.He does not workout. He denies chest pain, shortness of breath, dizziness.   He is on cholesterol medication and denies myalgias. His cholesterol is at goal. The cholesterol was:  06/26/2016: Cholesterol 155; HDL 36; LDL Cholesterol NOT CALC; Triglycerides 696   He has been working on diet and exercise for diabetes without complications, he is on bASA, he is on ACE/ARB, and denies  foot ulcerations, hyperglycemia, hypoglycemia , increased appetite, nausea, paresthesia of the feet, polydipsia, polyuria, visual disturbances and vomiting. Last A1C was: 06/26/2016: Hgb A1c MFr Bld 5.7   Patient is on Vitamin D supplement. 06/26/2016: Vit D, 25-Hydroxy 24  He has no complaints today.  He would like to avoid labs today.  Current Medications:  Current Outpatient Prescriptions on File Prior to Visit  Medication Sig Dispense Refill  . bisoprolol-hydrochlorothiazide (ZIAC) 5-6.25 MG tablet TAKE 1 TABLET EVERY MORNING FOR BLOOD PRESSURE 90 tablet 3  . Cholecalciferol (VITAMIN D PO) Take 8,000 Int'l Units by mouth daily.    . diclofenac sodium (VOLTAREN) 1 % GEL Apply 4 g topically 4 (four) times daily. 200 g 99  . atorvastatin (LIPITOR) 80 MG tablet Take 1/2 to 1 tablet  daily or as directed for cholesterol 30 tablet 5   No current facility-administered medications on file prior to visit.    Medical History:  Past Medical History:  Diagnosis Date  . 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 Hypertension   . Kidney stones   . Plantar fasciitis   . Prediabetes    Allergies:  Allergies  Allergen Reactions  . Fenofibrate Nausea Only  . Penicillins Other (See Comments)    Unknown     Review of Systems:  Review of Systems  Constitutional: Negative for chills, fever and malaise/fatigue.  HENT: Negative for congestion, ear pain and sore throat.   Eyes: Negative.   Respiratory: Negative for cough, shortness of breath and wheezing.   Cardiovascular: Negative for chest pain, palpitations and leg swelling.  Gastrointestinal: Negative for abdominal pain, blood in stool, constipation, diarrhea, heartburn and melena.  Genitourinary: Negative.   Skin: Negative.   Neurological: Negative for dizziness, sensory change, loss of consciousness and headaches.  Psychiatric/Behavioral: Negative for depression. The patient is not nervous/anxious and does not have insomnia.     Family history- Review and unchanged  Social history- Review and unchanged  Physical Exam: BP (!) 142/86   Pulse 90   Temp 97.8 F (36.6 C) (Temporal)   Resp 16   Ht 6' (1.829 m)   Wt 270 lb (122.5 kg)   BMI 36.62 kg/m  Wt Readings from Last 3 Encounters:  10/10/16 270 lb (122.5 kg)  06/26/16 279 lb 9.6 oz (126.8 kg)  12/16/15 275 lb 3.2 oz (124.8 kg)   General  Appearance: Well nourished well developed, non-toxic appearing, in no apparent distress. Eyes: PERRLA, EOMs, conjunctiva no swelling or erythema ENT/Mouth: Ear canals clear with no erythema, swelling, or discharge.  TMs normal bilaterally, oropharynx clear, moist, with no exudate.   Neck: Supple, thyroid normal, no JVD, no cervical adenopathy.  Respiratory: Respiratory effort normal, breath sounds clear A&P,  no wheeze, rhonchi or rales noted.  No retractions, no accessory muscle usage Cardio: RRR with no MRGs. No noted edema.  Abdomen: Soft, + BS.  Non tender, no guarding, rebound, hernias, masses. Musculoskeletal: Full ROM, 5/5 strength, Normal, Not tested, Wide based, Shuffling, Antalgic, Ambulates with walker, Non ambulatory and Ataxic gait Skin: Warm, dry without rashes, lesions, ecchymosis.  Neuro: Awake and oriented X 3, Cranial nerves intact. No cerebellar symptoms.  Psych: normal affect, Insight and Judgment appropriate.    Terri Piedraourtney Forcucci, PA-C 4:23 PM [facility]

## 2016-11-26 ENCOUNTER — Telehealth: Payer: Self-pay | Admitting: Internal Medicine

## 2016-11-26 NOTE — Telephone Encounter (Signed)
patient requesting sleep study, states he discussed at last office visit.

## 2016-11-27 ENCOUNTER — Telehealth: Payer: Self-pay | Admitting: Internal Medicine

## 2016-11-27 NOTE — Telephone Encounter (Signed)
Called to check statu, if you agreed to send  him for a for sleep study.

## 2016-11-28 ENCOUNTER — Other Ambulatory Visit: Payer: Self-pay | Admitting: Internal Medicine

## 2016-11-28 DIAGNOSIS — R400 Somnolence: Secondary | ICD-10-CM

## 2016-12-19 ENCOUNTER — Other Ambulatory Visit: Payer: Self-pay | Admitting: Internal Medicine

## 2016-12-19 DIAGNOSIS — E782 Mixed hyperlipidemia: Secondary | ICD-10-CM

## 2017-01-13 NOTE — Progress Notes (Signed)
NO SHOW

## 2017-01-14 ENCOUNTER — Ambulatory Visit: Payer: Self-pay | Admitting: Internal Medicine

## 2017-01-21 ENCOUNTER — Other Ambulatory Visit: Payer: Self-pay | Admitting: *Deleted

## 2017-01-21 DIAGNOSIS — E782 Mixed hyperlipidemia: Secondary | ICD-10-CM

## 2017-01-21 MED ORDER — ATORVASTATIN CALCIUM 80 MG PO TABS: ORAL_TABLET | ORAL | 0 refills | Status: DC

## 2017-01-30 ENCOUNTER — Ambulatory Visit (INDEPENDENT_AMBULATORY_CARE_PROVIDER_SITE_OTHER): Payer: BLUE CROSS/BLUE SHIELD | Admitting: Internal Medicine

## 2017-01-30 ENCOUNTER — Encounter: Payer: Self-pay | Admitting: Internal Medicine

## 2017-01-30 VITALS — BP 148/92 | HR 76 | Temp 98.2°F | Resp 16 | Ht 72.0 in | Wt 265.0 lb

## 2017-01-30 DIAGNOSIS — R7303 Prediabetes: Secondary | ICD-10-CM

## 2017-01-30 DIAGNOSIS — Z79899 Other long term (current) drug therapy: Secondary | ICD-10-CM

## 2017-01-30 DIAGNOSIS — I10 Essential (primary) hypertension: Secondary | ICD-10-CM | POA: Diagnosis not present

## 2017-01-30 DIAGNOSIS — E559 Vitamin D deficiency, unspecified: Secondary | ICD-10-CM | POA: Diagnosis not present

## 2017-01-30 DIAGNOSIS — E782 Mixed hyperlipidemia: Secondary | ICD-10-CM

## 2017-01-30 LAB — CBC WITH DIFFERENTIAL/PLATELET
BASOS ABS: 0 {cells}/uL (ref 0–200)
Basophils Relative: 0 %
EOS PCT: 1 %
Eosinophils Absolute: 62 cells/uL (ref 15–500)
HCT: 42.5 % (ref 38.5–50.0)
Hemoglobin: 14.6 g/dL (ref 13.2–17.1)
Lymphocytes Relative: 34 %
Lymphs Abs: 2108 cells/uL (ref 850–3900)
MCH: 34 pg — AB (ref 27.0–33.0)
MCHC: 34.4 g/dL (ref 32.0–36.0)
MCV: 99.1 fL (ref 80.0–100.0)
MONOS PCT: 12 %
MPV: 11.4 fL (ref 7.5–12.5)
Monocytes Absolute: 744 cells/uL (ref 200–950)
Neutro Abs: 3286 cells/uL (ref 1500–7800)
Neutrophils Relative %: 53 %
PLATELETS: 157 10*3/uL (ref 140–400)
RBC: 4.29 MIL/uL (ref 4.20–5.80)
RDW: 13.9 % (ref 11.0–15.0)
WBC: 6.2 10*3/uL (ref 3.8–10.8)

## 2017-01-30 LAB — TSH: TSH: 1.56 mIU/L (ref 0.40–4.50)

## 2017-01-30 MED ORDER — LISINOPRIL 40 MG PO TABS: ORAL_TABLET | ORAL | 1 refills | Status: DC

## 2017-01-30 NOTE — Patient Instructions (Signed)

## 2017-01-30 NOTE — Progress Notes (Signed)
This very nice 51 y.o.  MWM presents for 6 month follow up with Hypertension, Hyperlipidemia, Pre-Diabetes and Vitamin D Deficiency.      Patient is followed  for labile HTN (2002)  BP has been controlled at home. Today's BP was elevated x 2 at 148/92 an rechecked at 160/110.  Patient has had no complaints of any cardiac type chest pain, palpitations, dyspnea/orthopnea/PND, dizziness, claudication, or dependent edema.     Hyperlipidemia is controlled with diet & meds. Patient denies myalgias or other med SE's. Last Lipids were at goal albeit very elevated Trig's: Lab Results  Component Value Date   CHOL 155 06/26/2016   HDL 36 (L) 06/26/2016   LDLCALC NOT CALC 06/26/2016   TRIG 696 (H) 06/26/2016   CHOLHDL 4.3 06/26/2016      Also, the patient has history of Morbid Obesity (BMI 35+) and consequent PreDiabetes and has had no symptoms of reactive hypoglycemia, diabetic polys, paresthesias or visual blurring.  Last A1c was near goal: Lab Results  Component Value Date   HGBA1C 5.7 (H) 06/26/2016      Further, the patient also has history of Vitamin D Deficiency ("37" in 2013) and supplements vitamin D without any suspected side-effects. Last vitamin D was still very low (goal 70-100): Lab Results  Component Value Date   VD25OH 24 (L) 06/26/2016   Current Outpatient Prescriptions on File Prior to Visit  Medication Sig  . atorvastatin  80 MG tablet TAKE 1/2 TO 1 TAB DAILY   . bisoprolol-hctz 5-6.25 MG TAKE 1 TABLET EVERY MORNING  . VITAMIN D  Take 8,000 Int'l Units  daily.  . diclofenac  1 % GEL Apply 4 g topically 4 x daily.   Allergies  Allergen Reactions  . Fenofibrate Nausea Only  . Penicillins Other (See Comments)    Unknown   PMHx:   Past Medical History:  Diagnosis Date  . 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 Hypertension   . Kidney stones   . Plantar fasciitis   . Prediabetes    Immunization History  Administered Date(s) Administered  . PPD  Test 05/03/2014, 06/03/2015, 06/26/2016  . Pneumococcal Polysaccharide-23 03/12/2013  . Tdap 03/12/2013   Past Surgical History:  Procedure Laterality Date  . KNEE ARTHROSCOPY Right 1987   FHx:    Reviewed / unchanged  SHx:    Reviewed / unchanged  Systems Review:  Constitutional: Denies fever, chills, wt changes, headaches, insomnia, fatigue, night sweats, change in appetite. Eyes: Denies redness, blurred vision, diplopia, discharge, itchy, watery eyes.  ENT: Denies discharge, congestion, post nasal drip, epistaxis, sore throat, earache, hearing loss, dental pain, tinnitus, vertigo, sinus pain, snoring.  CV: Denies chest pain, palpitations, irregular heartbeat, syncope, dyspnea, diaphoresis, orthopnea, PND, claudication or edema. Respiratory: denies cough, dyspnea, DOE, pleurisy, hoarseness, laryngitis, wheezing.  Gastrointestinal: Denies dysphagia, odynophagia, heartburn, reflux, water brash, abdominal pain or cramps, nausea, vomiting, bloating, diarrhea, constipation, hematemesis, melena, hematochezia  or hemorrhoids. Genitourinary: Denies dysuria, frequency, urgency, nocturia, hesitancy, discharge, hematuria or flank pain. Musculoskeletal: Denies arthralgias, myalgias, stiffness, jt. swelling, pain, limping or strain/sprain.  Skin: Denies pruritus, rash, hives, warts, acne, eczema or change in skin lesion(s). Neuro: No weakness, tremor, incoordination, spasms, paresthesia or pain. Psychiatric: Denies confusion, memory loss or sensory loss. Endo: Denies change in weight, skin or hair change.  Heme/Lymph: No excessive bleeding, bruising or enlarged lymph nodes.  Physical Exam  BP (!) 148/92   Pulse 76   Temp  98.2 F (36.8 C) (Temporal)   Resp 16   Ht 6' (1.829 m)   Wt 265 lb (120.2 kg)   BMI 35.94 kg/m   Appears well nourished, well groomed  and in no distress.  Eyes: PERRLA, EOMs, conjunctiva no swelling or erythema. Sinuses: No frontal/maxillary tenderness ENT/Mouth:  EAC's clear, TM's nl w/o erythema, bulging. Nares clear w/o erythema, swelling, exudates. Oropharynx clear without erythema or exudates. Oral hygiene is good. Tongue normal, non obstructing. Hearing intact.  Neck: Supple. Thyroid nl. Car 2+/2+ without bruits, nodes or JVD. Chest: Respirations nl with BS clear & equal w/o rales, rhonchi, wheezing or stridor.  Cor: Heart sounds normal w/ regular rate and rhythm without sig. murmurs, gallops, clicks or rubs. Peripheral pulses normal and equal  without edema.  Abdomen: Soft & bowel sounds normal. Non-tender w/o guarding, rebound, hernias, masses or organomegaly.  Lymphatics: Unremarkable.  Musculoskeletal: Full ROM all peripheral extremities, joint stability, 5/5 strength and normal gait.  Skin: Warm, dry without exposed rashes, lesions or ecchymosis apparent.  Neuro: Cranial nerves intact, reflexes equal bilaterally. Sensory-motor testing grossly intact. Tendon reflexes grossly intact.  Pysch: Alert & oriented x 3.  Insight and judgement nl & appropriate. No ideations.  Assessment and Plan:  1. Essential hypertension  - Continue medication, monitor blood pressure at home.  - Continue DASH diet. Reminder to go to the ER if any CP,  SOB, nausea, dizziness, severe HA, changes vision/speech,  left arm numbness and tingling and jaw pain.  - CBC with Differential/Platelet - BASIC METABOLIC PANEL WITH GFR - Magnesium - TSH  - New Rx - Lisinopril  40 MG tablet; Take 1/2 to 1 tablet daily as directed for BP  Dispense: 90 tablet; Refill: 1 & monitor BP 2-3 x/ day and call if remains elevated. 2. Mixed hyperlipidemia  - Continue diet/meds, exercise,& lifestyle modifications.  - Continue monitor periodic cholesterol/liver & renal functions   - Hepatic function panel - Lipid panel - TSH  3. Prediabetes  - Continue diet, exercise, lifestyle modifications.  - Monitor appropriate labs.  - Hemoglobin A1c - Insulin, random  4. Vitamin D  deficiency  - Continue supplementation. - VITAMIN D 25 Hydroxy   5. Medication management  - CBC with Differential/Platelet - BASIC METABOLIC PANEL WITH GFR - Hepatic function panel - Magnesium - Lipid panel - TSH - Hemoglobin A1c - Insulin, random - VITAMIN D 25 Hydroxy       Discussed  regular exercise, BP monitoring, weight control to achieve/maintain BMI less than 25 and discussed med and SE's. Recommended labs to assess and monitor clinical status with further disposition pending results of labs. Over 30 minutes of exam, counseling, chart review was performed.

## 2017-01-31 LAB — HEPATIC FUNCTION PANEL
ALT: 38 U/L (ref 9–46)
AST: 43 U/L — ABNORMAL HIGH (ref 10–35)
Albumin: 4.4 g/dL (ref 3.6–5.1)
Alkaline Phosphatase: 118 U/L — ABNORMAL HIGH (ref 40–115)
BILIRUBIN DIRECT: 0.2 mg/dL (ref <=0.2)
BILIRUBIN TOTAL: 0.8 mg/dL (ref 0.2–1.2)
Indirect Bilirubin: 0.6 mg/dL (ref 0.2–1.2)
Total Protein: 6.9 g/dL (ref 6.1–8.1)

## 2017-01-31 LAB — LIPID PANEL
CHOL/HDL RATIO: 2.7 ratio (ref <5.0)
CHOLESTEROL: 131 mg/dL (ref <200)
HDL: 49 mg/dL (ref >40)
LDL Cholesterol: 21 mg/dL (ref <100)
Triglycerides: 306 mg/dL — ABNORMAL HIGH (ref <150)
VLDL: 61 mg/dL — ABNORMAL HIGH (ref <30)

## 2017-01-31 LAB — BASIC METABOLIC PANEL WITH GFR
BUN: 21 mg/dL (ref 7–25)
CALCIUM: 9.4 mg/dL (ref 8.6–10.3)
CO2: 24 mmol/L (ref 20–31)
CREATININE: 1.55 mg/dL — AB (ref 0.70–1.33)
Chloride: 102 mmol/L (ref 98–110)
GFR, Est African American: 59 mL/min — ABNORMAL LOW (ref >=60)
GFR, Est Non African American: 51 mL/min — ABNORMAL LOW (ref >=60)
Glucose, Bld: 97 mg/dL (ref 65–99)
Potassium: 4.2 mmol/L (ref 3.5–5.3)
SODIUM: 137 mmol/L (ref 135–146)

## 2017-01-31 LAB — MAGNESIUM: MAGNESIUM: 1.8 mg/dL (ref 1.5–2.5)

## 2017-01-31 LAB — HEMOGLOBIN A1C
Hgb A1c MFr Bld: 5.5 % (ref <5.7)
MEAN PLASMA GLUCOSE: 111 mg/dL

## 2017-01-31 LAB — VITAMIN D 25 HYDROXY (VIT D DEFICIENCY, FRACTURES): Vit D, 25-Hydroxy: 31 ng/mL (ref 30–100)

## 2017-01-31 LAB — INSULIN, RANDOM: INSULIN: 31 u[IU]/mL — AB (ref 2.0–19.6)

## 2017-02-16 ENCOUNTER — Other Ambulatory Visit: Payer: Self-pay | Admitting: Internal Medicine

## 2017-03-03 ENCOUNTER — Other Ambulatory Visit: Payer: Self-pay | Admitting: Internal Medicine

## 2017-03-03 DIAGNOSIS — E782 Mixed hyperlipidemia: Secondary | ICD-10-CM

## 2017-05-01 NOTE — Progress Notes (Deleted)
Assessment and Plan:   Hypertension -Continue medication, monitor blood pressure at home. Continue DASH diet.  Reminder to go to the ER if any CP, SOB, nausea, dizziness, severe HA, changes vision/speech, left arm numbness and tingling and jaw pain.  Cholesterol -Continue diet and exercise. Check cholesterol.    Prediabetes  -Continue diet and exercise. Check A1C  Vitamin D Def - check level and continue medications.   Obesity with co morbidities - long discussion about weight loss, diet, and exercise  Continue diet and meds as discussed. Further disposition pending results of labs. Over 30 minutes of exam, counseling, chart review, and critical decision making was performed  Future Appointments Date Time Provider Department Center  05/02/2017 4:30 PM Quentin Mulling, PA-C [facility] None  07/25/2017 10:00 AM Lucky Cowboy, MD [facility] None    HPI 51 y.o. male  presents for 3 month follow up on hypertension, cholesterol, prediabetes, and vitamin D deficiency.   His blood pressure has been controlled at home, today their BP is    He  Does not workout. He denies chest pain, shortness of breath, dizziness.  He is not on cholesterol medication and denies myalgias. His cholesterol is at goal. The cholesterol last visit was:   Lab Results  Component Value Date   CHOL 131 01/30/2017   HDL 49 01/30/2017   LDLCALC 21 01/30/2017   TRIG 306 (H) 01/30/2017   CHOLHDL 2.7 01/30/2017   He has been working on diet and exercise for prediabetes, and denies paresthesia of the feet, polydipsia, polyuria and visual disturbances. Last A1C in the office was:  Lab Results  Component Value Date   HGBA1C 5.5 01/30/2017  Patient is on Vitamin D supplement.   Lab Results  Component Value Date   VD25OH 31 01/30/2017  BMI is There is no height or weight on file to calculate BMI., he is working on diet and exercise. Wt Readings from Last 3 Encounters:  01/30/17 265 lb (120.2 kg)  10/10/16 270 lb  (122.5 kg)  06/26/16 279 lb 9.6 oz (126.8 kg)    Current Medications:  Current Outpatient Prescriptions on File Prior to Visit  Medication Sig Dispense Refill  . atorvastatin (LIPITOR) 80 MG tablet TAKE 1/2 TO 1 TABLET DAILY OR AS DIRECTED FOR CHOLESTEROL 90 tablet 1  . bisoprolol-hydrochlorothiazide (ZIAC) 5-6.25 MG tablet TAKE 1 TABLET EVERY MORNING FOR BLOOD PRESSURE 90 tablet 1  . Cholecalciferol (VITAMIN D PO) Take 8,000 Int'l Units by mouth daily.    . diclofenac sodium (VOLTAREN) 1 % GEL Apply 4 g topically 4 (four) times daily. 200 g 99  . lisinopril (PRINIVIL,ZESTRIL) 40 MG tablet Take 1/2 to 1 tablet daily as directed for BP 90 tablet 1   No current facility-administered medications on file prior to visit.    Medical History:  Past Medical History:  Diagnosis Date  . 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 Hypertension   . Kidney stones   . Plantar fasciitis   . Prediabetes    Allergies:  Allergies  Allergen Reactions  . Fenofibrate Nausea Only  . Penicillins Other (See Comments)    Unknown     Review of Systems:  Review of Systems  Constitutional: Negative.   HENT: Negative.   Eyes: Negative.   Respiratory: Negative.   Cardiovascular: Negative.   Gastrointestinal: Negative.   Genitourinary: Negative.   Musculoskeletal: Positive for myalgias (occ calf pain in the AM). Negative for back pain, falls, joint pain and neck  pain.  Skin: Negative.   Neurological: Negative.   Endo/Heme/Allergies: Negative.   Psychiatric/Behavioral: Negative.     Family history- Review and unchanged Social history- Review and unchanged Physical Exam: There were no vitals taken for this visit. Wt Readings from Last 3 Encounters:  01/30/17 265 lb (120.2 kg)  10/10/16 270 lb (122.5 kg)  06/26/16 279 lb 9.6 oz (126.8 kg)   General Appearance: Well nourished, in no apparent distress. Eyes: PERRLA, EOMs, conjunctiva no swelling or erythema Sinuses: No  Frontal/maxillary tenderness ENT/Mouth: Ext aud canals clear, TMs without erythema, bulging. No erythema, swelling, or exudate on post pharynx.  Tonsils not swollen or erythematous. Hearing normal.  Neck: Supple, thyroid normal.  Respiratory: Respiratory effort normal, BS equal bilaterally without rales, rhonchi, wheezing or stridor.  Cardio: RRR with no MRGs. Brisk peripheral pulses without edema.  Abdomen: Soft, + BS,, obese, Non tender, no guarding, rebound, hernias, masses. Lymphatics: Non tender without lymphadenopathy.  Musculoskeletal: Full ROM, 5/5 strength, Antalgic gait Skin: Warm, dry without rashes, lesions, ecchymosis.  Neuro: Cranial nerves intact. Normal muscle tone, no cerebellar symptoms. Psych: Awake and oriented X 3, normal affect, Insight and Judgment appropriate.    Quentin MullingAmanda Germany Chelf, PA-C 1:31 PM [facility]

## 2017-05-02 ENCOUNTER — Ambulatory Visit: Payer: Self-pay | Admitting: Physician Assistant

## 2017-07-25 ENCOUNTER — Ambulatory Visit (INDEPENDENT_AMBULATORY_CARE_PROVIDER_SITE_OTHER): Payer: Managed Care, Other (non HMO) | Admitting: Internal Medicine

## 2017-07-25 ENCOUNTER — Encounter: Payer: Self-pay | Admitting: Internal Medicine

## 2017-07-25 VITALS — BP 124/90 | HR 80 | Temp 97.5°F | Resp 18 | Ht 72.0 in | Wt 259.6 lb

## 2017-07-25 DIAGNOSIS — E782 Mixed hyperlipidemia: Secondary | ICD-10-CM

## 2017-07-25 DIAGNOSIS — Z0000 Encounter for general adult medical examination without abnormal findings: Secondary | ICD-10-CM | POA: Diagnosis not present

## 2017-07-25 DIAGNOSIS — Z111 Encounter for screening for respiratory tuberculosis: Secondary | ICD-10-CM | POA: Diagnosis not present

## 2017-07-25 DIAGNOSIS — Z136 Encounter for screening for cardiovascular disorders: Secondary | ICD-10-CM

## 2017-07-25 DIAGNOSIS — Z0001 Encounter for general adult medical examination with abnormal findings: Secondary | ICD-10-CM

## 2017-07-25 DIAGNOSIS — E559 Vitamin D deficiency, unspecified: Secondary | ICD-10-CM

## 2017-07-25 DIAGNOSIS — I10 Essential (primary) hypertension: Secondary | ICD-10-CM | POA: Diagnosis not present

## 2017-07-25 DIAGNOSIS — Z125 Encounter for screening for malignant neoplasm of prostate: Secondary | ICD-10-CM

## 2017-07-25 DIAGNOSIS — Z1212 Encounter for screening for malignant neoplasm of rectum: Secondary | ICD-10-CM

## 2017-07-25 DIAGNOSIS — R5383 Other fatigue: Secondary | ICD-10-CM

## 2017-07-25 DIAGNOSIS — Z79899 Other long term (current) drug therapy: Secondary | ICD-10-CM

## 2017-07-25 DIAGNOSIS — R7303 Prediabetes: Secondary | ICD-10-CM

## 2017-07-25 NOTE — Patient Instructions (Signed)

## 2017-07-25 NOTE — Progress Notes (Signed)
[facility]   Lucky Cowboy, M.D.     Dyanne Carrel. Steffanie Dunn, P.A.-C Judd Gaudier, DNP  [facility]                [address]                [place], [place]. [address] Telephone [phone] Telefax [phone] Annual  Screening/Preventative Visit  & Comprehensive Evaluation & Examination     This very nice 51 y.o.  MWM presents for a Screening/Preventative Visit & comprehensive evaluation and management of multiple medical co-morbidities.  Patient has been followed for HTN, Prediabetes, Hyperlipidemia and Vitamin D Deficiency.     Patient has been treated for HTN  since 2002. Patient's BP has been controlled at home.  Today's BP is elevated at 142/98 and rechecked at 124/90. Patient denies any cardiac symptoms as chest pain, palpitations, shortness of breath, dizziness or ankle swelling.     Patient's hyperlipidemia is controlled with diet and medications. Patient denies myalgias or other medication SE's. Last lipids were at goal albeit elevated Trig's:  Lab Results  Component Value Date   CHOL 131 01/30/2017   HDL 49 01/30/2017   LDLCALC 21 01/30/2017   TRIG 306 (H) 01/30/2017   CHOLHDL 2.7 01/30/2017      Patient has Morbid Obesity (BMI 35+) and consequent prediabetes (A1c 5.9%  / 2014)  and patient denies reactive hypoglycemic symptoms, visual blurring, diabetic polys or paresthesias. Last A1c was at goal: Lab Results  Component Value Date   HGBA1C 5.5 01/30/2017       Finally, patient has history of Vitamin D Deficiency ("37" / 2013 and "16" / 2017) and does not supplement as recommended and consequently his  last vitamin D was still very low : Lab Results  Component Value Date   VD25OH 31 01/30/2017   Current Outpatient Prescriptions on File Prior to Visit  Medication Sig  . atorvastatin (LIPITOR) 80 MG tablet TAKE 1/2 TO 1 TABLET DAILY OR AS DIRECTED FOR CHOLESTEROL  . bisoprolol-hydrochlorothiazide  (ZIAC) 5-6.25 MG tablet TAKE 1 TABLET EVERY MORNING FOR BLOOD PRESSURE  . Cholecalciferol (VITAMIN D PO) Take 8,000 Int'l Units by mouth daily.  . diclofenac sodium (VOLTAREN) 1 % GEL Apply 4 g topically 4 (four) times daily.  Marland Kitchen lisinopril (PRINIVIL,ZESTRIL) 40 MG tablet Take 1/2 to 1 tablet daily as directed for BP   No current facility-administered medications on file prior to visit.    Allergies  Allergen Reactions  . Fenofibrate Nausea Only  . Penicillins Other (See Comments)    Unknown   Past Medical History:  Diagnosis Date  . 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 Hypertension   . Kidney stones   . Plantar fasciitis   . Prediabetes    Health Maintenance  Topic Date Due  . COLONOSCOPY  11/03/2015  . INFLUENZA VACCINE  05/22/2017  . TETANUS/TDAP  03/13/2023  . HIV Screening  Completed   Immunization History  Administered Date(s) Administered  . PPD Test 05/03/2014, 06/03/2015, 06/26/2016  . Pneumococcal Polysaccharide-23 03/12/2013  . Tdap 03/12/2013   Past Surgical History:  Procedure Laterality Date  . KNEE ARTHROSCOPY Right 1987   No family history on file. Social History   Social History  . Marital status: Single    Spouse name: N/A  . Number of children: N/A  . Years of education: N/A   Occupational History  . Service Mgr for  [employer]   Social History Main Topics  . Smoking status: Former Smoker    Quit date: 07/22/2002  . Smokeless tobacco: Never Used  . Alcohol use No  . Drug use: No  . Sexual activity: Actoice    ROS Constitutional: Denies fever, chills, weight loss/gain, headaches, insomnia,  night sweats or change in appetite. Does c/o fatigue. Eyes: Denies redness, blurred vision, diplopia, discharge, itchy or watery eyes.  ENT: Denies discharge, congestion, post nasal drip, epistaxis, sore throat, earache, hearing loss, dental pain, Tinnitus, Vertigo, Sinus pain or snoring.  Cardio: Denies chest pain, palpitations,  irregular heartbeat, syncope, dyspnea, diaphoresis, orthopnea, PND, claudication or edema Respiratory: denies cough, dyspnea, DOE, pleurisy, hoarseness, laryngitis or wheezing.  Gastrointestinal: Denies dysphagia, heartburn, reflux, water brash, pain, cramps, nausea, vomiting, bloating, diarrhea, constipation, hematemesis, melena, hematochezia, jaundice or hemorrhoids Genitourinary: Denies dysuria, frequency, urgency, nocturia, hesitancy, discharge, hematuria or flank pain Musculoskeletal: Denies arthralgia, myalgia, stiffness, Jt. Swelling, pain, limp or strain/sprain. Denies Falls. Skin: Denies puritis, rash, hives, warts, acne, eczema or change in skin lesion Neuro: No weakness, tremor, incoordination, spasms, paresthesia or pain Psychiatric: Denies confusion, memory loss or sensory loss. Denies Depression. Endocrine: Denies change in weight, skin, hair change, nocturia, and paresthesia, diabetic polys, visual blurring or hyper / hypo glycemic episodes.  Heme/Lymph: No excessive bleeding, bruising or enlarged lymph nodes.  Physical Exam  BP (!) 142/98   Pulse 80   Temp (!) 97.5 F (36.4 C)   Resp 18   Ht 6' (1.829 m)   Wt 259 lb 9.6 oz (117.8 kg)   BMI 35.21 kg/m   General Appearance: Well nourished and well groomed and in no apparent distress.  Eyes: PERRLA, EOMs, conjunctiva no swelling or erythema, normal fundi and vessels. Sinuses: No frontal/maxillary tenderness ENT/Mouth: EACs patent / TMs  nl. Nares clear without erythema, swelling, mucoid exudates. Oral hygiene is good. No erythema, swelling, or exudate. Tongue normal, non-obstructing. Tonsils not swollen or erythematous. Hearing normal.  Neck: Supple, thyroid normal. No bruits, nodes or JVD. Respiratory: Respiratory effort normal.  BS equal and clear bilateral without rales, rhonci, wheezing or stridor. Cardio: Heart sounds are normal with regular rate and rhythm and no murmurs, rubs or gallops. Peripheral pulses are normal  and equal bilaterally without edema. No aortic or femoral bruits. Chest: symmetric with normal excursions and percussion.  Abdomen: Soft, with Nl bowel sounds. Nontender, no guarding, rebound, hernias, masses, or organomegaly.  Lymphatics: Non tender without lymphadenopathy.  Genitourinary: No hernias.Testes nl. DRE - prostate nl for age - smooth & firm w/o nodules. Musculoskeletal: Full ROM all peripheral extremities, joint stability, 5/5 strength, and normal gait. Skin: Warm and dry without rashes, lesions, cyanosis, clubbing or  ecchymosis.  Neuro: Cranial nerves intact, reflexes equal bilaterally. Normal muscle tone, no cerebellar symptoms. Sensation intact.  Pysch: Alert and oriented X 3 with normal affect, insight and judgment appropriate.   Assessment and Plan  1. Annual Preventative/Screening Exam   2. Essential hypertension  - EKG 12-Lead - [place], RETROPERITNL ABD,  LTD - Urinalysis, Routine w reflex microscopic - Microalbumin / creatinine urine ratio - CBC with Differential/Platelet - BASIC METABOLIC PANEL WITH GFR - Magnesium - TSH  3. Hyperlipidemia  - EKG 12-Lead - [place], RETROPERITNL ABD,  LTD - Hepatic function panel - Lipid panel  4. Prediabetes  - EKG 12-Lead - [place], RETROPERITNL ABD,  LTD - Hemoglobin A1c - Insulin, random  5. Vitamin D deficiency  - VITAMIN D 25 Hydroxy  6. Screening  for rectal cancer  - POC Hemoccult Bld/Stl   7. Prostate cancer screening  - PSA  8. Screening for ischemic heart disease  - EKG 12-Lead  9. Screening for AAA (aortic abdominal aneurysm)  - [place], RETROPERITNL ABD,  LTD  10. Screening examination for pulmonary tuberculosis  - PPD  11. Fatigue  - Iron,Total/Total Iron Binding Cap - Vitamin B12 - Testosterone - CBC with Differential/Platelet - TSH  12. Medication management  - Urinalysis, Routine w reflex microscopic - Microalbumin / creatinine urine ratio - CBC with Differential/Platelet - BASIC  METABOLIC PANEL WITH GFR - Hepatic function panel - Magnesium - Lipid panel - TSH - Hemoglobin A1c - Insulin, random - VITAMIN D 25 Hydroxy        Patient was counseled in prudent diet, weight control to achieve/maintain BMI less than 25, BP monitoring, regular exercise and medications as discussed.  Discussed med effects and SE's. Routine screening labs and tests as requested with regular follow-up as recommended. Over 40 minutes of exam, counseling, chart review and high complex critical decision making was performed

## 2017-07-26 LAB — IRON, TOTAL/TOTAL IRON BINDING CAP
%SAT: 43 % (ref 15–60)
Iron: 142 ug/dL (ref 50–180)
TIBC: 327 ug/dL (ref 250–425)

## 2017-07-26 LAB — URINALYSIS, ROUTINE W REFLEX MICROSCOPIC
Bilirubin Urine: NEGATIVE
Glucose, UA: NEGATIVE
HGB URINE DIPSTICK: NEGATIVE
KETONES UR: NEGATIVE
Leukocytes, UA: NEGATIVE
NITRITE: NEGATIVE
Protein, ur: NEGATIVE
SPECIFIC GRAVITY, URINE: 1.022 (ref 1.001–1.03)
pH: 6 (ref 5.0–8.0)

## 2017-07-26 LAB — CBC WITH DIFFERENTIAL/PLATELET
BASOS ABS: 29 {cells}/uL (ref 0–200)
Basophils Relative: 0.6 %
EOS PCT: 1.5 %
Eosinophils Absolute: 72 cells/uL (ref 15–500)
HEMATOCRIT: 39.8 % (ref 38.5–50.0)
Hemoglobin: 14.3 g/dL (ref 13.2–17.1)
Lymphs Abs: 1522 cells/uL (ref 850–3900)
MCH: 35.1 pg — ABNORMAL HIGH (ref 27.0–33.0)
MCHC: 35.9 g/dL (ref 32.0–36.0)
MCV: 97.8 fL (ref 80.0–100.0)
MPV: 12 fL (ref 7.5–12.5)
Monocytes Relative: 12.5 %
NEUTROS PCT: 53.7 %
Neutro Abs: 2578 cells/uL (ref 1500–7800)
PLATELETS: 117 10*3/uL — AB (ref 140–400)
RBC: 4.07 10*6/uL — AB (ref 4.20–5.80)
RDW: 12.8 % (ref 11.0–15.0)
TOTAL LYMPHOCYTE: 31.7 %
WBC mixed population: 600 cells/uL (ref 200–950)
WBC: 4.8 10*3/uL (ref 3.8–10.8)

## 2017-07-26 LAB — BASIC METABOLIC PANEL WITH GFR
BUN/Creatinine Ratio: 15 (calc) (ref 6–22)
BUN: 21 mg/dL (ref 7–25)
CALCIUM: 9.7 mg/dL (ref 8.6–10.3)
CHLORIDE: 98 mmol/L (ref 98–110)
CO2: 25 mmol/L (ref 20–32)
Creat: 1.4 mg/dL — ABNORMAL HIGH (ref 0.70–1.33)
GFR, EST AFRICAN AMERICAN: 67 mL/min/{1.73_m2} (ref >=60)
GFR, EST NON AFRICAN AMERICAN: 58 mL/min/{1.73_m2} — AB (ref >=60)
Glucose, Bld: 112 mg/dL — ABNORMAL HIGH (ref 65–99)
POTASSIUM: 4.8 mmol/L (ref 3.5–5.3)
SODIUM: 136 mmol/L (ref 135–146)

## 2017-07-26 LAB — HEPATIC FUNCTION PANEL
AG Ratio: 1.7 (calc) (ref 1.0–2.5)
ALBUMIN MSPROF: 4.3 g/dL (ref 3.6–5.1)
ALKALINE PHOSPHATASE (APISO): 108 U/L (ref 40–115)
ALT: 42 U/L (ref 9–46)
AST: 63 U/L — AB (ref 10–35)
BILIRUBIN DIRECT: 0.2 mg/dL (ref 0.0–0.2)
BILIRUBIN TOTAL: 1 mg/dL (ref 0.2–1.2)
Globulin: 2.5 g/dL (calc) (ref 1.9–3.7)
Indirect Bilirubin: 0.8 mg/dL (calc) (ref 0.2–1.2)
Total Protein: 6.8 g/dL (ref 6.1–8.1)

## 2017-07-26 LAB — TSH: TSH: 2.34 m[IU]/L (ref 0.40–4.50)

## 2017-07-26 LAB — MICROALBUMIN / CREATININE URINE RATIO
Creatinine, Urine: 159 mg/dL (ref 20–320)
Microalb Creat Ratio: 9 mcg/mg creat (ref <30)
Microalb, Ur: 1.5 mg/dL

## 2017-07-26 LAB — LIPID PANEL
Cholesterol: 139 mg/dL (ref <200)
HDL: 63 mg/dL (ref >40)
LDL Cholesterol (Calc): 38 mg/dL (calc)
NON-HDL CHOLESTEROL (CALC): 76 mg/dL (ref <130)
TRIGLYCERIDES: 364 mg/dL — AB (ref <150)
Total CHOL/HDL Ratio: 2.2 (calc) (ref <5.0)

## 2017-07-26 LAB — TESTOSTERONE: Testosterone: 212 ng/dL — ABNORMAL LOW (ref 250–827)

## 2017-07-26 LAB — HEMOGLOBIN A1C
EAG (MMOL/L): 6.3 (calc)
HEMOGLOBIN A1C: 5.6 %{Hb} (ref <5.7)
MEAN PLASMA GLUCOSE: 114 (calc)

## 2017-07-26 LAB — INSULIN, RANDOM: INSULIN: 23.4 u[IU]/mL — AB (ref 2.0–19.6)

## 2017-07-26 LAB — PSA: PSA: 1.1 ng/mL (ref <=4.0)

## 2017-07-26 LAB — VITAMIN D 25 HYDROXY (VIT D DEFICIENCY, FRACTURES): VIT D 25 HYDROXY: 29 ng/mL — AB (ref 30–100)

## 2017-07-26 LAB — MAGNESIUM: MAGNESIUM: 1.8 mg/dL (ref 1.5–2.5)

## 2017-07-26 LAB — VITAMIN B12: Vitamin B-12: 325 pg/mL (ref 200–1100)

## 2017-07-30 LAB — TB SKIN TEST
INDURATION: 0 mm
TB Skin Test: NEGATIVE

## 2017-08-05 ENCOUNTER — Other Ambulatory Visit: Payer: Self-pay | Admitting: Internal Medicine

## 2017-08-05 DIAGNOSIS — I10 Essential (primary) hypertension: Secondary | ICD-10-CM

## 2017-08-15 ENCOUNTER — Other Ambulatory Visit: Payer: Self-pay | Admitting: Internal Medicine

## 2017-10-04 ENCOUNTER — Other Ambulatory Visit: Payer: Self-pay | Admitting: Internal Medicine

## 2017-10-04 DIAGNOSIS — E782 Mixed hyperlipidemia: Secondary | ICD-10-CM

## 2017-10-29 NOTE — Progress Notes (Signed)
FOLLOW UP  Assessment and Plan:   Hypertension Moderate control with current medications; discussed start checking BP regularly, increase lisinopril to full tab (40 mg) Monitor blood pressure at home; patient to call if consistently greater than 130/80 Continue DASH diet.   Reminder to go to the ER if any CP, SOB, nausea, dizziness, severe HA, changes vision/speech, left arm numbness and tingling and jaw pain.  Cholesterol Currently at LDL goal with lifestyle modification; triglycerides remain elevated - discussed diet specific to this and risks, fenofibrate intolerant Continue low cholesterol diet and exercise.  Check lipid panel.   Prediabetes Recently well controlled with normalized A1Cs; if continues spread out A1C checks Continue diet and exercise.  Perform daily foot/skin check, notify office of any concerning changes.  Check A1C  Obesity with co morbidities Long discussion about weight loss, diet, and exercise Recommended diet heavy in fruits and veggies and low in animal meats, cheeses, and dairy products, appropriate calorie intake Discussed ideal weight for height  Patient will work on continuing moderation and good choices Will follow up in 3 months  Vitamin D Def Very low at last visit; continue supplementation to maintain goal of 70-100 Check Vit D level  Continue diet and meds as discussed. Further disposition pending results of labs. Discussed med's effects and SE's.   Over 30 minutes of exam, counseling, chart review, and critical decision making was performed.   Future Appointments  Date Time Provider Department Center  01/29/2018 10:30 AM Lucky Cowboy, MD [facility] None  08/26/2018 10:00 AM Lucky Cowboy, MD [facility] None    ----------------------------------------------------------------------------------------------------------------------  HPI 52 y.o. male  presents for 3 month follow up on hypertension, cholesterol, prediabetes, morbid  obesity and vitamin D deficiency.   BMI is Body mass index is 34.01 kg/m., he has been working on diet and exercise. Portion control - he is avoiding all starches. Stopped drinking cokes and is drinking more water.  Wt Readings from Last 3 Encounters:  10/30/17 250 lb 12.8 oz (113.8 kg)  07/25/17 259 lb 9.6 oz (117.8 kg)  01/30/17 265 lb (120.2 kg)   He has not been checking, today their BP is BP: 140/90  He does workout. He denies chest pain, shortness of breath, dizziness.   He is on cholesterol medication and denies myalgias. His LDL cholesterol is at goal, triglycerides were above goal.  The cholesterol last visit was:   Lab Results  Component Value Date   CHOL 139 07/25/2017   HDL 63 07/25/2017   LDLCALC 21 01/30/2017   TRIG 364 (H) 07/25/2017   CHOLHDL 2.2 07/25/2017    He has been working on diet and exercise for prediabetes recently normalized A1Cs at last 2 visits, and denies increased appetite, nausea, paresthesia of the feet, polydipsia, polyuria, visual disturbances and vomiting. Last A1C in the office was:  Lab Results  Component Value Date   HGBA1C 5.6 07/25/2017   Patient reports he is taking 8000 U Vitamin D supplement daily (up from last visit), but last check was very low:  Lab Results  Component Value Date   VD25OH 33 (L) 07/25/2017    He is not taking a PPI or other acid reflux medication   Current Medications:  Current Outpatient Medications on File Prior to Visit  Medication Sig  . atorvastatin (LIPITOR) 80 MG tablet TAKE 1/2 TO 1 TABLET DAILY OR AS DIRECTED FOR CHOLESTEROL  . bisoprolol-hydrochlorothiazide (ZIAC) 5-6.25 MG tablet TAKE 1 TABLET EVERY MORNING FOR BLOOD PRESSURE  . Cholecalciferol (VITAMIN D  PO) Take 8,000 Int'l Units by mouth daily.  . diclofenac sodium (VOLTAREN) 1 % GEL Apply 4 g topically 4 (four) times daily. (Patient taking differently: Apply 4 g topically as needed. )  . lisinopril (PRINIVIL,ZESTRIL) 40 MG tablet TAKE 1/2-1 TABLET BY  MOUTH DAILY AS DIRECTED FOR BP  . topiramate (TOPAMAX) 100 MG tablet TAKE 1 TABLET BY MOUTH ONCE A DAY (BEFORE A MEAL) FOR 30 DAYS   No current facility-administered medications on file prior to visit.      Allergies:  Allergies  Allergen Reactions  . Fenofibrate Nausea Only  . Penicillins Other (See Comments)    Unknown     Medical History:  Past Medical History:  Diagnosis Date  . 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 Hypertension   . Kidney stones   . Plantar fasciitis   . Prediabetes    Family history- Reviewed and unchanged Social history- Reviewed and unchanged   Review of Systems:  Review of Systems  Constitutional: Negative for malaise/fatigue and weight loss.  HENT: Negative for hearing loss and tinnitus.   Eyes: Negative for blurred vision and double vision.  Respiratory: Negative for cough, shortness of breath and wheezing.   Cardiovascular: Negative for chest pain, palpitations, orthopnea, claudication and leg swelling.  Gastrointestinal: Negative for abdominal pain, blood in stool, constipation, diarrhea, heartburn, melena, nausea and vomiting.  Genitourinary: Negative.   Musculoskeletal: Negative for joint pain and myalgias.  Skin: Negative for rash.  Neurological: Negative for dizziness, tingling, sensory change, weakness and headaches.  Endo/Heme/Allergies: Negative for polydipsia.  Psychiatric/Behavioral: Negative.   All other systems reviewed and are negative.     Physical Exam: BP 140/90   Pulse 85   Temp 97.9 F (36.6 C)   Ht 6' (1.829 m)   Wt 250 lb 12.8 oz (113.8 kg)   SpO2 97%   BMI 34.01 kg/m  Wt Readings from Last 3 Encounters:  10/30/17 250 lb 12.8 oz (113.8 kg)  07/25/17 259 lb 9.6 oz (117.8 kg)  01/30/17 265 lb (120.2 kg)   General Appearance: Well nourished, in no apparent distress. Eyes: PERRLA, EOMs, conjunctiva no swelling or erythema Sinuses: No Frontal/maxillary tenderness ENT/Mouth: Ext aud canals  clear, TMs without erythema, bulging. No erythema, swelling, or exudate on post pharynx.  Tonsils not swollen or erythematous. Hearing normal.  Neck: Supple, thyroid normal.  Respiratory: Respiratory effort normal, BS equal bilaterally without rales, rhonchi, wheezing or stridor.  Cardio: RRR with no MRGs. Brisk peripheral pulses without edema.  Abdomen: Soft, + BS.  Non tender, no guarding, rebound, hernias, masses. Lymphatics: Non tender without lymphadenopathy.  Musculoskeletal: Full ROM, 5/5 strength, Normal gait Skin: Warm, dry without rashes, lesions, ecchymosis.  Neuro: Cranial nerves intact. No cerebellar symptoms.  Psych: Awake and oriented X 3, normal affect, Insight and Judgment appropriate.    Dan MakerAshley C Chay Mazzoni, NP 10:41 AM [facility]

## 2017-10-30 ENCOUNTER — Ambulatory Visit: Payer: Managed Care, Other (non HMO) | Admitting: Adult Health

## 2017-10-30 ENCOUNTER — Encounter: Payer: Self-pay | Admitting: Adult Health

## 2017-10-30 VITALS — BP 140/90 | HR 85 | Temp 97.9°F | Ht 72.0 in | Wt 250.8 lb

## 2017-10-30 DIAGNOSIS — I10 Essential (primary) hypertension: Secondary | ICD-10-CM

## 2017-10-30 DIAGNOSIS — E559 Vitamin D deficiency, unspecified: Secondary | ICD-10-CM

## 2017-10-30 DIAGNOSIS — E782 Mixed hyperlipidemia: Secondary | ICD-10-CM | POA: Diagnosis not present

## 2017-10-30 DIAGNOSIS — Z79899 Other long term (current) drug therapy: Secondary | ICD-10-CM

## 2017-10-30 DIAGNOSIS — R7303 Prediabetes: Secondary | ICD-10-CM

## 2017-10-30 NOTE — Patient Instructions (Signed)
Ways to cut 100 calories  1. Eat your eggs with hot sauce OR salsa instead of cheese.  Eggs are great for breakfast, but many people consider eggs and cheese to be BFFs. Instead of cheese-1 oz. of cheddar has 114 calories-top your eggs with hot sauce, which contains no calories and helps with satiety and metabolism. Salsa is also a great option!!  2. Top your toast, waffles or pancakes with mashed berries instead of jelly or syrup. Half a cup of berries-fresh, frozen or thawed-has about 40 calories, compared with 2 tbsp. of maple syrup or jelly, which both have about 100 calories. The berries will also give you a good punch of fiber, which helps keep you full and satisfied and won't spike blood sugar quickly like the jelly or syrup. 3. Swap the non-fat latte for black coffee with a splash of half-and-half. Contrary to its name, that non-fat latte has 130 calories and a startling 19g of carbohydrates per 16 oz. serving. Replacing that 'light' drinkable dessert with a black coffee with a splash of half-and-half saves you more than 100 calories per 16 oz. serving. 4. Sprinkle salads with freeze-dried raspberries instead of dried cranberries. If you want a sweet addition to your nutritious salad, stay away from dried cranberries. They have a whopping 130 calories per  cup and 30g carbohydrates. Instead, sprinkle freeze-dried raspberries guilt-free and save more than 100 calories per  cup serving, adding 3g of belly-filling fiber. 5. Go for mustard in place of mayo on your sandwich. Mustard can add really nice flavor to any sandwich, and there are tons of varieties, from spicy to honey. A serving of mayo is 95 calories, versus 10 calories in a serving of mustard. 6. Choose a DIY salad dressing instead of the store-bought kind. Mix Dijon or whole grain mustard with low-fat Kefir or red wine vinegar and garlic. 7. Use hummus as a spread instead of a dip. Use hummus as a spread on a high-fiber cracker or  tortilla with a sandwich and save on calories without sacrificing taste. 8. Pick just one salad "accessory." Salad isn't automatically a calorie winner. It's easy to over-accessorize with toppings. Instead of topping your salad with nuts, avocado and cranberries (all three will clock in at 313 calories), just pick one. The next day, choose a different accessory, which will also keep your salad interesting. You don't wear all your jewelry every day, right? 9. Ditch the white pasta in favor of spaghetti squash. One cup of cooked spaghetti squash has about 40 calories, compared with traditional spaghetti, which comes with more than 200. Spaghetti squash is also nutrient-dense. It's a good source of fiber and Vitamins A and C, and it can be eaten just like you would eat pasta-with a great tomato sauce and turkey meatballs or with pesto, tofu and spinach, for example. 10. Dress up your chili, soups and stews with non-fat Greek yogurt instead of sour cream. Just a 'dollop' of sour cream can set you back 115 calories and a whopping 12g of fat-seven of which are of the artery-clogging variety. Added bonus: Greek yogurt is packed with muscle-building protein, calcium and B Vitamins. 11. Mash cauliflower instead of mashed potatoes. One cup of traditional mashed potatoes-in all their creamy goodness-has more than 200 calories, compared to mashed cauliflower, which you can typically eat for less than 100 calories per 1 cup serving. Cauliflower is a great source of the antioxidant indole-3-carbinol (I3C), which may help reduce the risk of some cancers, like breast   cancer. 12. Ditch the ice cream sundae in favor of a Greek yogurt parfait. Instead of a cup of ice cream or fro-yo for dessert, try 1 cup of nonfat Greek yogurt topped with fresh berries and a sprinkle of cacao nibs. Both toppings are packed with antioxidants, which can help reduce cellular inflammation and oxidative damage. And the comparison is a  no-brainer: One cup of ice cream has about 275 calories; one cup of frozen yogurt has about 230; and a cup of Greek yogurt has just 130, plus twice the protein, so you're less likely to return to the freezer for a second helping. 13. Put olive oil in a spray container instead of using it directly from the bottle. Each tablespoon of olive oil is 120 calories and 15g of fat. Use a mister instead of pouring it straight into the pan or onto a salad. This allows for portion control and will save you more than 100 calories. 14. When baking, substitute canned pumpkin for butter or oil. Canned pumpkin-not pumpkin pie mix-is loaded with Vitamin A, which is important for skin and eye health, as well as immunity. And the comparisons are pretty crazy:  cup of canned pumpkin has about 40 calories, compared to butter or oil, which has more than 800 calories. Yes, 800 calories. Applesauce and mashed banana can also serve as good substitutions for butter or oil, usually in a 1:1 ratio. 15. Top casseroles with high-fiber cereal instead of breadcrumbs. Breadcrumbs are typically made with white bread, while breakfast cereals contain 5-9g of fiber per serving. Not only will you save more than 150 calories per  cup serving, the swap will also keep you more full and you'll get a metabolism boost from the added fiber. 16. Snack on pistachios instead of macadamia nuts. Believe it or not, you get the same amount of calories from 35 pistachios (100 calories) as you would from only five macadamia nuts. 17. Chow down on kale chips rather than potato chips. This is my favorite 'don't knock it 'till you try it' swap. Kale chips are so easy to make at home, and you can spice them up with a little grated parmesan or chili powder. Plus, they're a mere fraction of the calories of potato chips, but with the same crunch factor we crave so often. 18. Add seltzer and some fruit slices to your cocktail instead of soda or fruit juice. One  cup of soda or fruit juice can pack on as much as 140 calories. Instead, use seltzer and fruit slices. The fruit provides valuable phytochemicals, such as flavonoids and anthocyanins, which help to combat cancer and stave off the aging process.  

## 2017-10-31 LAB — LIPID PANEL
CHOL/HDL RATIO: 3.3 (calc) (ref <5.0)
Cholesterol: 165 mg/dL (ref <200)
HDL: 50 mg/dL (ref >40)
NON-HDL CHOLESTEROL (CALC): 115 mg/dL (ref <130)
Triglycerides: 656 mg/dL — ABNORMAL HIGH (ref <150)

## 2017-10-31 LAB — CBC WITH DIFFERENTIAL/PLATELET
BASOS ABS: 22 {cells}/uL (ref 0–200)
Basophils Relative: 0.5 %
Eosinophils Absolute: 39 cells/uL (ref 15–500)
Eosinophils Relative: 0.9 %
HCT: 41.5 % (ref 38.5–50.0)
HEMOGLOBIN: 14.8 g/dL (ref 13.2–17.1)
Lymphs Abs: 1522 cells/uL (ref 850–3900)
MCH: 35.2 pg — AB (ref 27.0–33.0)
MCHC: 35.7 g/dL (ref 32.0–36.0)
MCV: 98.8 fL (ref 80.0–100.0)
MONOS PCT: 9.9 %
MPV: 11.9 fL (ref 7.5–12.5)
Neutro Abs: 2292 cells/uL (ref 1500–7800)
Neutrophils Relative %: 53.3 %
PLATELETS: 135 10*3/uL — AB (ref 140–400)
RBC: 4.2 10*6/uL (ref 4.20–5.80)
RDW: 12.6 % (ref 11.0–15.0)
TOTAL LYMPHOCYTE: 35.4 %
WBC: 4.3 10*3/uL (ref 3.8–10.8)
WBCMIX: 426 {cells}/uL (ref 200–950)

## 2017-10-31 LAB — HEPATIC FUNCTION PANEL
AG RATIO: 1.8 (calc) (ref 1.0–2.5)
ALKALINE PHOSPHATASE (APISO): 119 U/L — AB (ref 40–115)
ALT: 51 U/L — ABNORMAL HIGH (ref 9–46)
AST: 57 U/L — ABNORMAL HIGH (ref 10–35)
Albumin: 4.6 g/dL (ref 3.6–5.1)
BILIRUBIN DIRECT: 0.2 mg/dL (ref 0.0–0.2)
BILIRUBIN INDIRECT: 0.6 mg/dL (ref 0.2–1.2)
GLOBULIN: 2.5 g/dL (ref 1.9–3.7)
Total Bilirubin: 0.8 mg/dL (ref 0.2–1.2)
Total Protein: 7.1 g/dL (ref 6.1–8.1)

## 2017-10-31 LAB — HEMOGLOBIN A1C
HEMOGLOBIN A1C: 5.6 %{Hb} (ref <5.7)
MEAN PLASMA GLUCOSE: 114 (calc)
eAG (mmol/L): 6.3 (calc)

## 2017-10-31 LAB — BASIC METABOLIC PANEL WITH GFR
BUN: 20 mg/dL (ref 7–25)
CO2: 28 mmol/L (ref 20–32)
CREATININE: 1.29 mg/dL (ref 0.70–1.33)
Calcium: 9.4 mg/dL (ref 8.6–10.3)
Chloride: 100 mmol/L (ref 98–110)
GFR, EST AFRICAN AMERICAN: 74 mL/min/{1.73_m2} (ref >=60)
GFR, Est Non African American: 64 mL/min/{1.73_m2} (ref >=60)
GLUCOSE: 118 mg/dL — AB (ref 65–99)
Potassium: 4.6 mmol/L (ref 3.5–5.3)
SODIUM: 138 mmol/L (ref 135–146)

## 2017-10-31 LAB — TSH: TSH: 1.64 mIU/L (ref 0.40–4.50)

## 2017-10-31 LAB — VITAMIN D 25 HYDROXY (VIT D DEFICIENCY, FRACTURES): Vit D, 25-Hydroxy: 27 ng/mL — ABNORMAL LOW (ref 30–100)

## 2017-12-05 ENCOUNTER — Encounter (HOSPITAL_COMMUNITY): Payer: Self-pay | Admitting: Emergency Medicine

## 2017-12-05 ENCOUNTER — Emergency Department (HOSPITAL_COMMUNITY): Payer: Managed Care, Other (non HMO)

## 2017-12-05 ENCOUNTER — Other Ambulatory Visit: Payer: Self-pay

## 2017-12-05 ENCOUNTER — Emergency Department (HOSPITAL_COMMUNITY)
Admission: EM | Admit: 2017-12-05 | Discharge: 2017-12-05 | Disposition: A | Discharge status: Home / Self Care | Payer: Managed Care, Other (non HMO) | Attending: Emergency Medicine | Admitting: Emergency Medicine

## 2017-12-05 DIAGNOSIS — Z87891 Personal history of nicotine dependence: Secondary | ICD-10-CM | POA: Diagnosis not present

## 2017-12-05 DIAGNOSIS — Z79899 Other long term (current) drug therapy: Secondary | ICD-10-CM | POA: Diagnosis not present

## 2017-12-05 DIAGNOSIS — I10 Essential (primary) hypertension: Secondary | ICD-10-CM | POA: Diagnosis not present

## 2017-12-05 DIAGNOSIS — N200 Calculus of kidney: Secondary | ICD-10-CM | POA: Diagnosis not present

## 2017-12-05 DIAGNOSIS — R112 Nausea with vomiting, unspecified: Secondary | ICD-10-CM | POA: Diagnosis not present

## 2017-12-05 DIAGNOSIS — R109 Unspecified abdominal pain: Secondary | ICD-10-CM | POA: Diagnosis present

## 2017-12-05 LAB — CBC
HCT: 42.7 % (ref 39.0–52.0)
Hemoglobin: 14.4 g/dL (ref 13.0–17.0)
MCH: 34.7 pg — AB (ref 26.0–34.0)
MCHC: 33.7 g/dL (ref 30.0–36.0)
MCV: 102.9 fL — AB (ref 78.0–100.0)
PLATELETS: 107 10*3/uL — AB (ref 150–400)
RBC: 4.15 MIL/uL — ABNORMAL LOW (ref 4.22–5.81)
RDW: 12.8 % (ref 11.5–15.5)
WBC: 6 10*3/uL (ref 4.0–10.5)

## 2017-12-05 LAB — URINALYSIS, ROUTINE W REFLEX MICROSCOPIC
BILIRUBIN URINE: NEGATIVE
GLUCOSE, UA: 50 mg/dL — AB
KETONES UR: NEGATIVE mg/dL
Leukocytes, UA: NEGATIVE
Nitrite: NEGATIVE
PH: 6 (ref 5.0–8.0)
Protein, ur: 30 mg/dL — AB
Specific Gravity, Urine: 1.019 (ref 1.005–1.030)

## 2017-12-05 LAB — BASIC METABOLIC PANEL
Anion gap: 12 (ref 5–15)
BUN: 17 mg/dL (ref 6–20)
CALCIUM: 9 mg/dL (ref 8.9–10.3)
CO2: 23 mmol/L (ref 22–32)
CREATININE: 1.41 mg/dL — AB (ref 0.61–1.24)
Chloride: 104 mmol/L (ref 101–111)
GFR calc non Af Amer: 56 mL/min — ABNORMAL LOW (ref >60)
GLUCOSE: 161 mg/dL — AB (ref 65–99)
Potassium: 3.7 mmol/L (ref 3.5–5.1)
Sodium: 139 mmol/L (ref 135–145)

## 2017-12-05 LAB — URINALYSIS, MICROSCOPIC (REFLEX)
Squamous Epithelial / LPF: NONE SEEN
WBC, UA: NONE SEEN WBC/hpf (ref 0–5)

## 2017-12-05 MED ORDER — SODIUM CHLORIDE 0.9 % IV BOLUS (SEPSIS)
1000.0000 mL | Freq: Once | INTRAVENOUS | Status: AC
  Administered 2017-12-05: 1000 mL via INTRAVENOUS

## 2017-12-05 MED ORDER — OXYCODONE-ACETAMINOPHEN 5-325 MG PO TABS
1.0000 | ORAL_TABLET | Freq: Once | ORAL | Status: AC
  Administered 2017-12-05: 1 via ORAL

## 2017-12-05 MED ORDER — HYDROMORPHONE HCL 1 MG/ML IJ SOLN
1.0000 mg | Freq: Once | INTRAMUSCULAR | Status: AC
  Administered 2017-12-05: 1 mg via INTRAVENOUS
  Filled 2017-12-05: qty 1

## 2017-12-05 MED ORDER — OXYCODONE-ACETAMINOPHEN 5-325 MG PO TABS
ORAL_TABLET | ORAL | Status: AC
  Filled 2017-12-05: qty 1

## 2017-12-05 MED ORDER — ONDANSETRON HCL 4 MG/2ML IJ SOLN
4.0000 mg | Freq: Once | INTRAMUSCULAR | Status: AC
Start: 2017-12-05 — End: 2017-12-05
  Administered 2017-12-05: 4 mg via INTRAVENOUS
  Filled 2017-12-05: qty 2

## 2017-12-05 MED ORDER — OXYCODONE-ACETAMINOPHEN 5-325 MG PO TABS: 2.0000 | ORAL_TABLET | Freq: Four times a day (QID) | ORAL | 0 refills | Status: DC | PRN

## 2017-12-05 MED ORDER — ONDANSETRON 4 MG PO TBDP: 4.0000 mg | ORAL_TABLET | Freq: Three times a day (TID) | ORAL | 0 refills | Status: DC | PRN

## 2017-12-05 MED ORDER — KETOROLAC TROMETHAMINE 30 MG/ML IJ SOLN
30.0000 mg | Freq: Once | INTRAMUSCULAR | Status: AC
  Administered 2017-12-05: 30 mg via INTRAVENOUS
  Filled 2017-12-05: qty 1

## 2017-12-05 MED ORDER — TAMSULOSIN HCL 0.4 MG PO CAPS: 0.4000 mg | ORAL_CAPSULE | Freq: Every day | ORAL | 0 refills | Status: DC

## 2017-12-05 NOTE — ED Triage Notes (Signed)
Pt c/o 10/10 left flank pain for the past 2 hours, bloody urine. Prior hx of kidney stones.

## 2017-12-05 NOTE — ED Provider Notes (Signed)
TIME SEEN: 3:14 AM  CHIEF COMPLAINT: Left sided flank pain  HPI: Patient is a 52 year old male with history of hypertension, kidney stones who presents emergency department with sudden onset left flank pain that started last night.  No injury to the back.  Has had nausea and vomiting.  No fever.  No diarrhea.  Feels similar to his previous kidney stones.  Has had to have several surgeries before for his kidney stones.  Has not had one in many years.  Does not currently have a urologist.  ROS: See HPI Constitutional: no fever  Eyes: no drainage  ENT: no runny nose   Cardiovascular:  no chest pain  Resp: no SOB  GI: no vomiting GU: no dysuria Integumentary: no rash  Allergy: no hives  Musculoskeletal: no leg swelling  Neurological: no slurred speech ROS otherwise negative  PAST MEDICAL HISTORY/PAST SURGICAL HISTORY:  Past Medical History:  Diagnosis Date  . 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 Hypertension   . Kidney stones   . Plantar fasciitis   . Prediabetes     MEDICATIONS:  Prior to Admission medications   Medication Sig Start Date End Date Taking? Authorizing Provider  atorvastatin (LIPITOR) 80 MG tablet TAKE 1/2 TO 1 TABLET DAILY OR AS DIRECTED FOR CHOLESTEROL 10/04/17   Quentin Mullingollier, Amanda, PA-C  bisoprolol-hydrochlorothiazide [facility](ZIAC) 5-6.25 MG tablet TAKE 1 TABLET EVERY MORNING FOR BLOOD PRESSURE 08/15/17   Judd Gaudierorbett, Ashley, NP  Cholecalciferol (VITAMIN D PO) Take 8,000 Int'l Units by mouth daily.    [provider]  diclofenac sodium (VOLTAREN) 1 % GEL Apply 4 g topically 4 (four) times daily. Patient taking differently: Apply 4 g topically as needed.  12/16/15   Lucky CowboyMcKeown, William, MD  lisinopril (PRINIVIL,ZESTRIL) 40 MG tablet TAKE 1/2-1 TABLET BY MOUTH DAILY AS DIRECTED FOR BP 08/05/17   Lucky CowboyMcKeown, William, MD  topiramate (TOPAMAX) 100 MG tablet TAKE 1 TABLET BY MOUTH ONCE A DAY (BEFORE A MEAL) FOR 30 DAYS 07/10/17   [provider]     ALLERGIES:  Allergies  Allergen Reactions  . Fenofibrate Nausea Only  . Penicillins Other (See Comments)    Unknown    SOCIAL HISTORY:  Social History   Tobacco Use  . Smoking status: Former Smoker    Last attempt to quit: 07/22/2002    Years since quitting: 15.3  . Smokeless tobacco: Never Used  Substance Use Topics  . Alcohol use: No    FAMILY HISTORY: No family history on file.  EXAM: BP (!) 183/103   Pulse 93   Temp 98 F (36.7 C) (Oral)   Resp 18   Ht 6' (1.829 m)   Wt 106.6 kg (235 lb)   SpO2 100%   BMI 31.87 kg/m  CONSTITUTIONAL: Alert and oriented and responds appropriately to questions.  Obese, afebrile, appears very uncomfortable HEAD: Normocephalic EYES: Conjunctivae clear, pupils appear equal, EOMI ENT: normal nose; moist mucous membranes NECK: Supple, no meningismus, no nuchal rigidity, no LAD  CARD: RRR; S1 and S2 appreciated; no murmurs, no clicks, no rubs, no gallops RESP: Normal chest excursion without splinting or tachypnea; breath sounds clear and equal bilaterally; no wheezes, no rhonchi, no rales, no hypoxia or respiratory distress, speaking full sentences ABD/GI: Normal bowel sounds; non-distended; soft, non-tender, no rebound, no guarding, no peritoneal signs, no hepatosplenomegaly BACK:  The back appears normal, patient has left CVA tenderness, no midline spinal tenderness or step-off or deformity EXT: Normal ROM in all joints; non-tender to  palpation; no edema; normal capillary refill; no cyanosis, no calf tenderness or swelling    SKIN: Normal color for age and race; warm; no rash NEURO: Moves all extremities equally, normal sensation diffusely PSYCH: The patient's mood and manner are appropriate. Grooming and personal hygiene are appropriate.  MEDICAL DECISION MAKING: Patient here with complaints of left flank pain.  Suspect kidney stone.  Differential also includes pyelonephritis.  Abdominal exam is benign.  No midline spinal  tenderness.  No focal neurologic deficits.  Will obtain labs, urine and CT of his abdomen pelvis.  Will give IV fluids, Toradol, Dilaudid, Zofran.  ED PROGRESS: Urine shows blood but no sign of infection.  Patient is a 4 mm distal left ureteral calculus with mild obstructive uropathy.  Pain is been well controlled with Dilaudid and Toradol.  I feel he is safe to be discharged.  Will discharge with Percocet, Zofran, Flomax.  He has been given a urine strainer.  Discussed return precautions with patient.   At this time, I do not feel there is any life-threatening condition present. I have reviewed and discussed all results (EKG, imaging, lab, urine as appropriate) and exam findings with patient/family. I have reviewed nursing notes and appropriate previous records.  I feel the patient is safe to be discharged home without further emergent workup and can continue workup as an outpatient as needed. Discussed usual and customary return precautions. Patient/family verbalize understanding and are comfortable with this plan.  Outpatient follow-up has been provided if needed. All questions have been answered.      Ward, Layla Maw, DO 12/05/17 [phone]

## 2017-12-09 ENCOUNTER — Other Ambulatory Visit: Payer: Self-pay

## 2017-12-09 MED ORDER — BISOPROLOL-HYDROCHLOROTHIAZIDE 5-6.25 MG PO TABS: ORAL_TABLET | ORAL | 1 refills | Status: DC

## 2018-01-29 ENCOUNTER — Encounter: Payer: Self-pay | Admitting: Internal Medicine

## 2018-01-29 ENCOUNTER — Ambulatory Visit: Payer: Managed Care, Other (non HMO) | Admitting: Internal Medicine

## 2018-01-29 VITALS — BP 120/84 | HR 80 | Temp 97.5°F | Resp 18 | Ht 72.0 in | Wt 246.0 lb

## 2018-01-29 DIAGNOSIS — E559 Vitamin D deficiency, unspecified: Secondary | ICD-10-CM | POA: Diagnosis not present

## 2018-01-29 DIAGNOSIS — E782 Mixed hyperlipidemia: Secondary | ICD-10-CM

## 2018-01-29 DIAGNOSIS — Z79899 Other long term (current) drug therapy: Secondary | ICD-10-CM

## 2018-01-29 DIAGNOSIS — R7303 Prediabetes: Secondary | ICD-10-CM

## 2018-01-29 DIAGNOSIS — I10 Essential (primary) hypertension: Secondary | ICD-10-CM | POA: Diagnosis not present

## 2018-01-29 NOTE — Patient Instructions (Signed)

## 2018-01-29 NOTE — Progress Notes (Signed)
This very nice 52 y.o. MWM presents for 6 month follow up with HTN, HLD, Pre-Diabetes and Vitamin D Deficiency.      Patient is treated for HTN (2002) & BP has been controlled at home. Today's BP is at goal - 120/84. Patient has had no complaints of any cardiac type chest pain, palpitations, dyspnea / orthopnea / PND, dizziness, claudication, or dependent edema.     Hyperlipidemia is controlled with diet & meds. Patient denies myalgias or other med SE's. Last Lipids were  Lab Results  Component Value Date   CHOL 165 10/30/2017   HDL 50 10/30/2017   LDLCALC LDL cholesterol not calculated. 10/30/2017   TRIG 656 (H) 10/30/2017   CHOLHDL 3.3 10/30/2017      Also, the patient has history of Morbid Obesity (BMI 33+) and PreDiabetes (A1c 5.9%/2014)  and has had no symptoms of reactive hypoglycemia, diabetic polys, paresthesias or visual blurring. Patient's weight is down 13# over the last 6 months on Topamax. Last A1c was Normal & at goal: Lab Results  Component Value Date   HGBA1C 5.6 10/30/2017      Further, the patient also has history of Vitamin D Deficiency ("37"/2014 and "16"/2017) and doesn't supplements vitamin D. Last vitamin D was still very low:  Lab Results  Component Value Date   VD25OH 27 (L) 10/30/2017   Current Outpatient Medications on File Prior to Visit  Medication Sig  . atorvastatin (LIPITOR) 80 MG tablet TAKE 1/2 TO 1 TABLET DAILY OR AS DIRECTED FOR CHOLESTEROL (Patient taking differently: TAKE 1/2 TABLET DAILY OR AS DIRECTED FOR CHOLESTEROL)  . bisoprolol-hydrochlorothiazide (ZIAC) 5-6.25 MG tablet TAKE 1 TABLET EVERY MORNING FOR BLOOD PRESSURE  . Cholecalciferol (VITAMIN D PO) Take 8,000 Int'l Units by mouth daily.  Marland Kitchen lisinopril (PRINIVIL,ZESTRIL) 40 MG tablet TAKE 1/2-1 TABLET BY MOUTH DAILY AS DIRECTED FOR BP (Patient taking differently: TAKE 1/2 TABLET BY MOUTH DAILY AS DIRECTED FOR BP)  . oxyCODONE-acetaminophen (PERCOCET/ROXICET) 5-325 MG tablet Take 2  tablets by mouth every 6 (six) hours as needed.  . topiramate (TOPAMAX) 100 MG tablet TAKE 1 TABLET BY MOUTH ONCE A DAY AS NEEDED FOR HEADACHE   No current facility-administered medications on file prior to visit.    Allergies  Allergen Reactions  . Fenofibrate Nausea Only  . Penicillins Other (See Comments)    Unknown   PMHx:   Past Medical History:  Diagnosis Date  . 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 Hypertension   . Kidney stones   . Plantar fasciitis   . Prediabetes    Immunization History  Administered Date(s) Administered  . PPD Test 05/03/2014, 06/03/2015, 06/26/2016, 07/25/2017  . Pneumococcal Polysaccharide-23 03/12/2013  . Tdap 03/12/2013   Past Surgical History:  Procedure Laterality Date  . KNEE ARTHROSCOPY Right 1987   FHx:    Reviewed / unchanged  SHx:    Reviewed / unchanged  Systems Review:  Constitutional: Denies fever, chills, wt changes, headaches, insomnia, fatigue, night sweats, change in appetite. Eyes: Denies redness, blurred vision, diplopia, discharge, itchy, watery eyes.  ENT: Denies discharge, congestion, post nasal drip, epistaxis, sore throat, earache, hearing loss, dental pain, tinnitus, vertigo, sinus pain, snoring.  CV: Denies chest pain, palpitations, irregular heartbeat, syncope, dyspnea, diaphoresis, orthopnea, PND, claudication or edema. Respiratory: denies cough, dyspnea, DOE, pleurisy, hoarseness, laryngitis, wheezing.  Gastrointestinal: Denies dysphagia, odynophagia, heartburn, reflux, water brash, abdominal pain or cramps, nausea, vomiting, bloating, diarrhea, constipation, hematemesis, melena, hematochezia  or hemorrhoids. Genitourinary: Denies dysuria, frequency, urgency, nocturia, hesitancy, discharge, hematuria or flank pain. Musculoskeletal: Denies arthralgias, myalgias, stiffness, jt. swelling, pain, limping or strain/sprain.  Skin: Denies pruritus, rash, hives, warts, acne, eczema or change in skin  lesion(s). Neuro: No weakness, tremor, incoordination, spasms, paresthesia or pain. Psychiatric: Denies confusion, memory loss or sensory loss. Endo: Denies change in weight, skin or hair change.  Heme/Lymph: No excessive bleeding, bruising or enlarged lymph nodes.  Physical Exam  BP 120/84   Pulse 80   Temp (!) 97.5 F (36.4 C)   Resp 18   Ht 6' (1.829 m)   Wt 246 lb (111.6 kg)   BMI 33.36 kg/m   Appears  well nourished, well groomed  and in no distress.  Eyes: PERRLA, EOMs, conjunctiva no swelling or erythema. Sinuses: No frontal/maxillary tenderness ENT/Mouth: EAC's clear, TM's nl w/o erythema, bulging. Nares clear w/o erythema, swelling, exudates. Oropharynx clear without erythema or exudates. Oral hygiene is good. Tongue normal, non obstructing. Hearing intact.  Neck: Supple. Thyroid not palpable. Car 2+/2+ without bruits, nodes or JVD. Chest: Respirations nl with BS clear & equal w/o rales, rhonchi, wheezing or stridor.  Cor: Heart sounds normal w/ regular rate and rhythm without sig. murmurs, gallops, clicks or rubs. Peripheral pulses normal and equal  without edema.  Abdomen: Soft & bowel sounds normal. Non-tender w/o guarding, rebound, hernias, masses or organomegaly.  Lymphatics: Unremarkable.  Musculoskeletal: Full ROM all peripheral extremities, joint stability, 5/5 strength and normal gait.  Skin: Warm, dry without exposed rashes, lesions or ecchymosis apparent.  Neuro: Cranial nerves intact, reflexes equal bilaterally. Sensory-motor testing grossly intact. Tendon reflexes grossly intact.  Pysch: Alert & oriented x 3.  Insight and judgement nl & appropriate. No ideations.  Assessment and Plan:  1. Essential hypertension  - Continue medication, monitor blood pressure at home.  - Continue DASH diet.  Reminder to go to the ER if any CP,  SOB, nausea, dizziness, severe HA, changes vision/speech.  - CBC with Differential/Platelet - BASIC METABOLIC PANEL WITH GFR -  Magnesium - TSH  2. Hyperlipidemia, mixed  - Continue diet/meds, exercise,& lifestyle modifications.  - Continue monitor periodic cholesterol/liver & renal functions   - Hepatic function panel - Lipid panel - TSH  3. Prediabetes  - Continue diet, exercise, lifestyle modifications.  - Monitor appropriate labs. - Hemoglobin A1c - Insulin, random  4. Vitamin D deficiency  - Continue supplementation.  - VITAMIN D 25 Hydroxyl  5. Medication management  - CBC with Differential/Platelet - BASIC METABOLIC PANEL WITH GFR - Hepatic function panel - Magnesium - Lipid panel - TSH - Hemoglobin A1c - Insulin, random - VITAMIN D 25 Hydroxyl           Discussed  regular exercise, BP monitoring, weight control to achieve/maintain BMI less than 25 and discussed med and SE's. Recommended labs to assess and monitor clinical status with further disposition pending results of labs. Over 30 minutes of exam, counseling, chart review was performed.

## 2018-01-30 ENCOUNTER — Other Ambulatory Visit: Payer: Self-pay | Admitting: Internal Medicine

## 2018-01-30 DIAGNOSIS — E782 Mixed hyperlipidemia: Secondary | ICD-10-CM

## 2018-01-30 LAB — BASIC METABOLIC PANEL WITH GFR
BUN/Creatinine Ratio: 17 (calc) (ref 6–22)
BUN: 25 mg/dL (ref 7–25)
CO2: 25 mmol/L (ref 20–32)
CREATININE: 1.5 mg/dL — AB (ref 0.70–1.33)
Calcium: 9.6 mg/dL (ref 8.6–10.3)
Chloride: 97 mmol/L — ABNORMAL LOW (ref 98–110)
GFR, Est African American: 61 mL/min/{1.73_m2} (ref >=60)
GFR, Est Non African American: 53 mL/min/{1.73_m2} — ABNORMAL LOW (ref >=60)
Glucose, Bld: 121 mg/dL — ABNORMAL HIGH (ref 65–99)
Potassium: 4.2 mmol/L (ref 3.5–5.3)
SODIUM: 139 mmol/L (ref 135–146)

## 2018-01-30 LAB — HEPATIC FUNCTION PANEL
AG Ratio: 1.8 (calc) (ref 1.0–2.5)
ALKALINE PHOSPHATASE (APISO): 109 U/L (ref 40–115)
ALT: 66 U/L — AB (ref 9–46)
AST: 90 U/L — ABNORMAL HIGH (ref 10–35)
Albumin: 4.3 g/dL (ref 3.6–5.1)
BILIRUBIN TOTAL: 0.8 mg/dL (ref 0.2–1.2)
Bilirubin, Direct: 0.2 mg/dL (ref 0.0–0.2)
Globulin: 2.4 g/dL (calc) (ref 1.9–3.7)
Indirect Bilirubin: 0.6 mg/dL (calc) (ref 0.2–1.2)
TOTAL PROTEIN: 6.7 g/dL (ref 6.1–8.1)

## 2018-01-30 LAB — CBC WITH DIFFERENTIAL/PLATELET
BASOS ABS: 20 {cells}/uL (ref 0–200)
Basophils Relative: 0.4 %
EOS PCT: 0.6 %
Eosinophils Absolute: 30 cells/uL (ref 15–500)
HEMATOCRIT: 42.9 % (ref 38.5–50.0)
Hemoglobin: 15.6 g/dL (ref 13.2–17.1)
LYMPHS ABS: 1655 {cells}/uL (ref 850–3900)
MCH: 35.5 pg — ABNORMAL HIGH (ref 27.0–33.0)
MCHC: 36.4 g/dL — AB (ref 32.0–36.0)
MCV: 97.5 fL (ref 80.0–100.0)
MPV: 11.8 fL (ref 7.5–12.5)
Monocytes Relative: 7.5 %
NEUTROS PCT: 58.4 %
Neutro Abs: 2920 cells/uL (ref 1500–7800)
Platelets: 142 10*3/uL (ref 140–400)
RBC: 4.4 10*6/uL (ref 4.20–5.80)
RDW: 13.4 % (ref 11.0–15.0)
Total Lymphocyte: 33.1 %
WBC mixed population: 375 cells/uL (ref 200–950)
WBC: 5 10*3/uL (ref 3.8–10.8)

## 2018-01-30 LAB — HEMOGLOBIN A1C
Hgb A1c MFr Bld: 5.8 % of total Hgb — ABNORMAL HIGH (ref <5.7)
Mean Plasma Glucose: 120 (calc)
eAG (mmol/L): 6.6 (calc)

## 2018-01-30 LAB — LIPID PANEL
CHOL/HDL RATIO: 11.6 (calc) — AB (ref <5.0)
CHOLESTEROL: 361 mg/dL — AB (ref <200)
HDL: 31 mg/dL — AB (ref >40)
NON-HDL CHOLESTEROL (CALC): 330 mg/dL — AB (ref <130)
Triglycerides: 2705 mg/dL — ABNORMAL HIGH (ref <150)

## 2018-01-30 LAB — TSH: TSH: 1.77 mIU/L (ref 0.40–4.50)

## 2018-01-30 LAB — INSULIN, RANDOM: Insulin: 17.1 u[IU]/mL (ref 2.0–19.6)

## 2018-01-30 LAB — MAGNESIUM: Magnesium: 1.9 mg/dL (ref 1.5–2.5)

## 2018-01-30 LAB — VITAMIN D 25 HYDROXY (VIT D DEFICIENCY, FRACTURES): Vit D, 25-Hydroxy: 18 ng/mL — ABNORMAL LOW (ref 30–100)

## 2018-01-30 MED ORDER — ROSUVASTATIN CALCIUM 40 MG PO TABS: ORAL_TABLET | ORAL | 1 refills | Status: DC

## 2018-01-30 MED ORDER — FENOFIBRATE 145 MG PO TABS: 145.0000 mg | ORAL_TABLET | Freq: Every day | ORAL | 1 refills | Status: DC

## 2018-02-03 ENCOUNTER — Encounter: Payer: Self-pay | Admitting: *Deleted

## 2018-03-28 ENCOUNTER — Emergency Department
Admission: EM | Admit: 2018-03-28 | Discharge: 2018-03-28 | Disposition: A | Discharge status: Home / Self Care | Payer: Managed Care, Other (non HMO) | Attending: Emergency Medicine | Admitting: Emergency Medicine

## 2018-03-28 ENCOUNTER — Encounter: Payer: Self-pay | Admitting: Emergency Medicine

## 2018-03-28 ENCOUNTER — Other Ambulatory Visit: Payer: Self-pay

## 2018-03-28 DIAGNOSIS — I10 Essential (primary) hypertension: Secondary | ICD-10-CM | POA: Diagnosis not present

## 2018-03-28 DIAGNOSIS — Z79899 Other long term (current) drug therapy: Secondary | ICD-10-CM | POA: Diagnosis not present

## 2018-03-28 DIAGNOSIS — L0231 Cutaneous abscess of buttock: Secondary | ICD-10-CM | POA: Insufficient documentation

## 2018-03-28 DIAGNOSIS — Z87891 Personal history of nicotine dependence: Secondary | ICD-10-CM | POA: Diagnosis not present

## 2018-03-28 DIAGNOSIS — R222 Localized swelling, mass and lump, trunk: Secondary | ICD-10-CM | POA: Diagnosis present

## 2018-03-28 DIAGNOSIS — R7303 Prediabetes: Secondary | ICD-10-CM | POA: Diagnosis not present

## 2018-03-28 MED ORDER — OXYCODONE-ACETAMINOPHEN 7.5-325 MG PO TABS: 1.0000 | ORAL_TABLET | Freq: Four times a day (QID) | ORAL | 0 refills | Status: DC | PRN

## 2018-03-28 MED ORDER — SULFAMETHOXAZOLE-TRIMETHOPRIM 800-160 MG PO TABS: 1.0000 | ORAL_TABLET | Freq: Two times a day (BID) | ORAL | 0 refills | Status: DC

## 2018-03-28 NOTE — ED Provider Notes (Signed)
[facility] Emergency Department Provider Note   ____________________________________________   First MD Initiated Contact with Patient 03/28/18 1244     (approximate)  I have reviewed the triage vital signs and the nursing notes.   HISTORY  Chief Complaint Abscess and Rash    HPI Spencer Humphrey is a 52 y.o. male patient presents for swollen area to the left buttock.  Patient states started as a rash at a swimming pool.  Patient went to urgent care clinic and was diagnosed abscess placed on doxycycline 2 days ago.  Patient stated no improvement.  Patient states he noticed pain shooting down his left leg.  Patient denies drainage.  Rates pain as a 10/10.  Described pain as "sharp".  No other palliative measures for complaint.  Past Medical History:  Diagnosis Date  . 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 Hypertension   . Kidney stones   . Plantar fasciitis   . Prediabetes     Patient Active Problem List   Diagnosis Date Noted  . Obesity (BMI 30.0-34.9) 02/11/2015  . Medication management 11/11/2014  . Seizure (HCC) 02/12/2014  . Hyperlipidemia 09/23/2013  . Vitamin D deficiency 09/21/2013  . Hypertension   . Kidney stones   . Plantar fasciitis   . Prediabetes   . DJD (degenerative joint disease) of knee     Past Surgical History:  Procedure Laterality Date  . KNEE ARTHROSCOPY Right 1987    Prior to Admission medications   Medication Sig Start Date End Date Taking? Authorizing Provider  atorvastatin (LIPITOR) 80 MG tablet TAKE 1/2 TO 1 TABLET DAILY OR AS DIRECTED FOR CHOLESTEROL Patient taking differently: TAKE 1/2 TABLET DAILY OR AS DIRECTED FOR CHOLESTEROL 10/04/17   Quentin Mulling, PA-C  bisoprolol-hydrochlorothiazide [employer]) 5-6.25 MG tablet TAKE 1 TABLET EVERY MORNING FOR BLOOD PRESSURE 12/09/17   Judd Gaudier, NP  Cholecalciferol (VITAMIN D PO) Take 8,000 Int'l Units by mouth daily.    [provider]    fenofibrate (TRICOR) 145 MG tablet Take 1 tablet (145 mg total) by mouth daily. 01/30/18 01/30/19  Lucky Cowboy, MD  lisinopril (PRINIVIL,ZESTRIL) 40 MG tablet TAKE 1/2-1 TABLET BY MOUTH DAILY AS DIRECTED FOR BP Patient taking differently: TAKE 1/2 TABLET BY MOUTH DAILY AS DIRECTED FOR BP 08/05/17   Lucky Cowboy, MD  oxyCODONE-acetaminophen (PERCOCET) 7.5-325 MG tablet Take 1 tablet by mouth every 6 (six) hours as needed for severe pain. 03/28/18   Joni Reining, PA-C  oxyCODONE-acetaminophen (PERCOCET/ROXICET) 5-325 MG tablet Take 2 tablets by mouth every 6 (six) hours as needed. 12/05/17   Ward, Layla Maw, DO  rosuvastatin (CRESTOR) 40 MG tablet Take 1/2 to 1 tablet daily or as directed for Cholesterol 01/30/18   Lucky Cowboy, MD  sulfamethoxazole-trimethoprim (BACTRIM DS,SEPTRA DS) 800-160 MG tablet Take 1 tablet by mouth 2 (two) times daily. 03/28/18   Joni Reining, PA-C  topiramate (TOPAMAX) 100 MG tablet TAKE 1 TABLET BY MOUTH ONCE A DAY AS NEEDED FOR HEADACHE 07/10/17   [provider]    Allergies Fenofibrate and Penicillins  No family history on file.  Social History Social History   Tobacco Use  . Smoking status: Former Smoker    Last attempt to quit: 07/22/2002    Years since quitting: 15.6  . Smokeless tobacco: Never Used  Substance Use Topics  . Alcohol use: No  . Drug use: No    Review of Systems  Constitutional: No fever/chills Eyes: No visual  changes. ENT: No sore throat. Cardiovascular: Denies chest pain. Respiratory: Denies shortness of breath. Gastrointestinal: No abdominal pain.  No nausea, no vomiting.  No diarrhea.  No constipation. Genitourinary: Negative for dysuria. Musculoskeletal: Negative for back pain. Skin: Negative for rash. Neurological: Negative for headaches, focal weakness or numbness. Endocrine:Hyperlipidemia, hypertension, and prediabetes. Allergic/Immunilogical: See medication  list ____________________________________________   PHYSICAL EXAM:  VITAL SIGNS: ED Triage Vitals  Enc Vitals Group     BP --      Pulse --      Resp --      Temp --      Temp src --      SpO2 --      Weight 03/28/18 1245 244 lb (110.7 kg)     Height 03/28/18 1245 6' (1.829 m)     Head Circumference --      Peak Flow --      Pain Score 03/28/18 1244 10     Pain Loc --      Pain Edu? --      Excl. in GC? --     Constitutional: Alert and oriented. Well appearing and in no acute distress.  Morbid obesity. Cardiovascular: Normal rate, regular rhythm. Grossly normal heart sounds.  Good peripheral circulation.  Elevated blood pressure. Respiratory: Normal respiratory effort.  No retractions. Lungs CTAB. Gastrointestinal: Soft and nontender. No distention. No abdominal bruits. No CVA tenderness. Musculoskeletal: No lower extremity tenderness nor edema.  No joint effusions. Neurologic:  Normal speech and language. No gross focal neurologic deficits are appreciated. No gait instability. Skin: Edema erythema left buttocks.  Area is nonfluctuant at this time.   Psychiatric: Mood and affect are normal. Speech and behavior are normal.  ____________________________________________   LABS (all labs ordered are listed, but only abnormal results are displayed)  Labs Reviewed - No data to display ____________________________________________  EKG   ____________________________________________  RADIOLOGY  ED MD interpretation:    Official radiology report(s): No results found.  ____________________________________________   PROCEDURES  Procedure(s) performed: None  Procedures  Critical Care performed: No  ____________________________________________   INITIAL IMPRESSION / ASSESSMENT AND PLAN / ED COURSE  As part of my medical decision making, I reviewed the following data within the electronic MEDICAL RECORD NUMBER    Abscess left buttocks.  Discussed with patient  rationale for not incised and draining at this time.  Patient given discharge care instruction.  Patient advised continue previous medication.  Patient was thought taking Bactrim and Percocet as directed.  Advised to follow-up with PCP or this department if no improvement in 3 days.      ____________________________________________   FINAL CLINICAL IMPRESSION(S) / ED DIAGNOSES  Final diagnoses:  Abscess of buttock, left     ED Discharge Orders        Ordered    oxyCODONE-acetaminophen (PERCOCET) 7.5-325 MG tablet  Every 6 hours PRN     03/28/18 1258    sulfamethoxazole-trimethoprim (BACTRIM DS,SEPTRA DS) 800-160 MG tablet  2 times daily     03/28/18 1258       Note:  This document was prepared using Dragon voice recognition software and may include unintentional dictation errors.    Joni ReiningSmith, Ronald K, PA-C 03/28/18 1309    Schaevitz, Myra Rudeavid Matthew, MD 03/28/18 1600

## 2018-03-28 NOTE — ED Notes (Signed)
First Nurse Note: Pt sent from [facility] to be seen for abscess on left buttock with surrounding cellulitis. Pt in NAD at this time.

## 2018-03-28 NOTE — ED Triage Notes (Signed)
Presents with possible abscess area  States he started out with a rash  And then the pain shot down his left leg  Hx of shingles  States the pain feels the same

## 2018-03-28 NOTE — Discharge Instructions (Addendum)
Continue previous medication and follow discharge care instructions.  Advised to follow-up PCP or this department if no improvement in 3 days.

## 2018-04-21 ENCOUNTER — Other Ambulatory Visit: Payer: Self-pay | Admitting: Adult Health

## 2018-04-27 NOTE — Progress Notes (Signed)
  Subjective:    Patient ID: Spencer Humphrey, male    DOB: December 25, 1965, 52 y.o.   MRN: [id number]  HPI  This nice 52 yo MWM is seen in 1 month f/u of a Lt buttock abscess - initially w/ Doxycycline for 2-3 day and then on 03/28/2018 was rx'd Septra DS x 10 days. He reports the abscess opened, drained & resolved. Since then he has developed pain & tenderness of the Left thigh associated with squat to stand, leg crossing and walking.   Outpatient Medications Prior to Visit  Medication Sig Dispense Refill  . atorvastatin (LIPITOR) 80 MG tablet TAKE 1/2 TO 1 TABLET DAILY OR AS DIRECTED FOR CHOLESTEROL (Patient taking differently: TAKE 1/2 TABLET DAILY OR AS DIRECTED FOR CHOLESTEROL) 90 tablet 1  . bisoprolol-hydrochlorothiazide (ZIAC) 5-6.25 MG tablet TAKE 1 TABLET EVERY MORNING FOR BLOOD PRESSURE 90 tablet 1  . Cholecalciferol (VITAMIN D PO) Take 8,000 Int'l Units by mouth daily.    . fenofibrate (TRICOR) 145 MG tablet Take 1 tablet (145 mg total) by mouth daily. 90 tablet 1  . lisinopril (PRINIVIL,ZESTRIL) 40 MG tablet TAKE 1/2-1 TABLET BY MOUTH DAILY AS DIRECTED FOR BP (Patient taking differently: TAKE 1/2 TABLET BY MOUTH DAILY AS DIRECTED FOR BP) 90 tablet 1  . rosuvastatin (CRESTOR) 40 MG tablet Take 1/2 to 1 tablet daily or as directed for Cholesterol 90 tablet 1  . oxyCODONE-acetaminophen (PERCOCET) 7.5-325 MG tablet Take 1 tablet by mouth every 6 (six) hours as needed for severe pain. 20 tablet 0  . oxyCODONE-acetaminophen (PERCOCET/ROXICET) 5-325 MG tablet Take 2 tablets by mouth every 6 (six) hours as needed. 20 tablet 0  . sulfamethoxazole-trimethoprim (BACTRIM DS,SEPTRA DS) 800-160 MG tablet Take 1 tablet by mouth 2 (two) times daily. 20 tablet 0  . topiramate (TOPAMAX) 100 MG tablet TAKE 1 TABLET BY MOUTH ONCE A DAY AS NEEDED FOR HEADACHE  3   No facility-administered medications prior to visit.    Allergies  Allergen Reactions  . Fenofibrate Nausea Only  . Penicillins Other (See  Comments)    Unknown   Past Medical History:  Diagnosis Date  . 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 Hypertension   . Kidney stones   . Plantar fasciitis   . Prediabetes    Past Surgical History:  Procedure Laterality Date  . KNEE ARTHROSCOPY Right 1987   Review of Systems    10 point systems review negative except as above.    Objective:   Physical Exam  BP (!) 160/96   Pulse 80   Temp 97.6 F (36.4 C)   Resp 18   Ht 6' (1.829 m)   Wt 238 lb 12.8 oz (108.3 kg)   BMI 32.39 kg/m    GU - Peri-rectal no abscesses or buttock skin lesions palpated.   MS- FROM w/o deformities.  S limping Gait. (+) tender od quadriceps proximal & distal insertions Neuro -  Nl w/o focal abnormalities.    Assessment & Plan:   1. Tendinitis of adductor muscle of left hip  - predniSONE (DELTASONE) 20 MG tablet; 1 tab 3 x day for 5 days, then 1 tab 2 x day for 5 days, then 1 tab 1 x day for 5 days  Dispense: 30 tablet  - Sx - Lyrica 50 mg (#21)  - take 1 to 2 caps 3 x day as needed for pain

## 2018-04-28 ENCOUNTER — Ambulatory Visit: Payer: Managed Care, Other (non HMO) | Admitting: Internal Medicine

## 2018-04-28 ENCOUNTER — Encounter: Payer: Self-pay | Admitting: Internal Medicine

## 2018-04-28 VITALS — BP 160/96 | HR 80 | Temp 97.6°F | Resp 18 | Ht 72.0 in | Wt 238.8 lb

## 2018-04-28 DIAGNOSIS — M76892 Other specified enthesopathies of left lower limb, excluding foot: Secondary | ICD-10-CM

## 2018-04-28 MED ORDER — PREDNISONE 20 MG PO TABS: ORAL_TABLET | ORAL | 0 refills | Status: DC

## 2018-04-28 NOTE — Patient Instructions (Signed)
Tendinitis Tendinitis is inflammation of a tendon. A tendon is a strong cord of tissue that connects muscle to bone. Tendinitis can affect any tendon, but it most commonly affects the shoulder tendon (rotator cuff), ankle tendon (Achilles tendon), elbow tendon (triceps tendon), or one of the tendons in the wrist. What are the causes? This condition may be caused by:  Overusing a tendon or muscle. This is common.  Age-related wear and tear.  Injury.  Inflammatory conditions, such as arthritis.  Certain medicines.  What increases the risk? This condition is more likely to develop in people who do activities that involve repetitive motions. What are the signs or symptoms? Symptoms of this condition may include:  Pain.  Tenderness.  Mild swelling.  How is this diagnosed? This condition is diagnosed with a physical exam. You may also have tests, such as:  Ultrasound. This uses sound waves to make an image of your affected area.  MRI.  How is this treated? This condition may be treated by resting, icing, applying pressure (compression), and raising (elevating) the area above the level of your heart. This is known as RICE therapy. Treatment may also include:  Medicines to help reduce inflammation or to help reduce pain.  Exercises or physical therapy to strengthen and stretch the tendon.  A brace or splint.  Surgery (rare).  Follow these instructions at home:  If you have a splint or brace:  Wear the splint or brace as told by your health care provider. Remove it only as told by your health care provider.  Loosen the splint or brace if your fingers or toes tingle, become numb, or turn cold and blue.  Do not take baths, swim, or use a hot tub until your health care provider approves. Ask your health care provider if you can take showers. You may only be allowed to take sponge baths for bathing.  Do not let your splint or brace get wet if it is not waterproof. ? If your  splint or brace is not waterproof, cover it with a watertight plastic bag when you take a bath or a shower.  Keep the splint or brace clean. Managing pain, stiffness, and swelling  If directed, apply ice to the affected area. ? Put ice in a plastic bag. ? Place a towel between your skin and the bag. ? Leave the ice on for 20 minutes, 2-3 times a day.  If directed, apply heat to the affected area as often as told by your health care provider. Use the heat source that your health care provider recommends, such as a moist heat pack or a heating pad. ? Place a towel between your skin and the heat source. ? Leave the heat on for 20-30 minutes. ? Remove the heat if your skin turns bright red. This is especially important if you are unable to feel pain, heat, or cold. You may have a greater risk of getting burned.  Move the fingers or toes of the affected limb often, if this applies. This can help to prevent stiffness and lessen swelling.  If directed, elevate the affected area above the level of your heart while you are sitting or lying down. Driving  Do not drive or operate heavy machinery while taking prescription pain medicine.  Ask your health care provider when it is safe to drive if you have a splint or brace on any part of your arm or leg. Activity  Return to your normal activities as told by your health care   provider. Ask your health care provider what activities are safe for you.  Rest the affected area as told by your health care provider.  Avoid using the affected area while you are experiencing symptoms of tendinitis.  Do exercises as told by your health care provider. General instructions  If you have a splint, do not put pressure on any part of the splint until it is fully hardened. This may take several hours.  Wear an elastic bandage or compression wrap only as told by your health care provider.  Take over-the-counter and prescription medicines only as told by your  health care provider.  Keep all follow-up visits as told by your health care provider. This is important. Contact a health care provider if:  Your symptoms do not improve.  You develop new, unexplained problems, such as numbness in your hands. This information is not intended to replace advice given to you by your health care provider. Make sure you discuss any questions you have with your health care provider. Document Released: 10/05/2000 Document Revised: 06/07/2016 Document Reviewed: 07/11/2015 [employer] Interactive Patient Education  2018 [employer]  

## 2018-05-26 ENCOUNTER — Other Ambulatory Visit: Payer: Self-pay | Admitting: Nurse Practitioner

## 2018-05-26 DIAGNOSIS — M545 Low back pain, unspecified: Secondary | ICD-10-CM

## 2018-05-26 DIAGNOSIS — Z77018 Contact with and (suspected) exposure to other hazardous metals: Secondary | ICD-10-CM

## 2018-05-28 NOTE — Progress Notes (Signed)
FOLLOW UP  Assessment and Plan:   Hypertension Increase lisinopril to 40 mg daily, START CHECKING BP Monitor blood pressure at home; patient to call if consistently greater than 130/80 Continue DASH diet.   Reminder to go to the ER if any CP, SOB, nausea, dizziness, severe HA, changes vision/speech, left arm numbness and tingling and jaw pain.  Cholesterol Currently with severe triglyceride elevation despite on atorvastatin 80 mg daily, couldn't tolerate fenofibrate, lifestyle discussed at length, add omega 3, if not better will refer to lipid clinic for severe elevations Continue low cholesterol diet and exercise.  Check lipid panel.   Prediabetes Discussed risks of elevated glucose Continue diet and exercise.  Perform daily foot/skin check, notify office of any concerning changes.  Check A1C  Obesity with co morbidities Long discussion about weight loss, diet, and exercise Recommended diet heavy in fruits and veggies and low in animal meats, cheeses, and dairy products, appropriate calorie intake Discussed ideal weight for height  Patient will work on continuing moderation and good choices Start weighing weekly to track Will follow up in 3 months  Vitamin D Def Very low at last visit; continue supplementation to maintain goal of 70-100 Check Vit D level  Non-healing skin lesion of left cheek - referred to skin surgery center for ? Need biopsy  Abscess of left buttock Continue sitz baths to help drain; will prescribe doxycycline; hygiene discussed; follow up if not resolving  Continue diet and meds as discussed. Further disposition pending results of labs. Discussed med's effects and SE's.   Over 30 minutes of exam, counseling, chart review, and critical decision making was performed.   Future Appointments  Date Time Provider Department Center  06/10/2018 12:30 PM [facility] DG 1 [facility]  06/10/2018  1:20 PM [facility] MR 1 [facility]  08/26/2018 10:00  AM Lucky CowboyMcKeown, William, MD [facility] None    ----------------------------------------------------------------------------------------------------------------------  HPI 52 y.o. male  presents for 3 month follow up on hypertension, cholesterol, prediabetes, morbid obesity and vitamin D deficiency. He reports ongoing non-healing ulcer/scab that is becoming worse to left cheek;   He also reports recurrent tender spot to his buttock x 3 weeks; had another spot that resolved prior to this current;   BMI is Body mass index is 33.07 kg/m., he has been working on diet and exercise. Portion control - he is avoiding all starches. Admits he restarted drinking cokes.  Wt Readings from Last 3 Encounters:  05/29/18 243 lb 12.8 oz (110.6 kg)  04/28/18 238 lb 12.8 oz (108.3 kg)  03/28/18 244 lb (110.7 kg)   He has not been checking, today their BP is BP: (!) 140/100 currently taking ziac 5-6.25 mg, currently taking lisinopril 20 mg (1/2 tab), is having pain and taking ibuprofen  He does workout. He denies chest pain, shortness of breath, dizziness.   He is on cholesterol medication (atorvastatin 80 mg, stopped fenofibrate 145 mg daily due to interolance) and denies myalgias. His LDL cholesterol is at goal, triglycerides were above goal.  The cholesterol last visit was:   Lab Results  Component Value Date   CHOL 361 (H) 01/29/2018   HDL 31 (L) 01/29/2018   LDLCALC  01/29/2018     Comment:     . LDL cholesterol not calculated. Triglyceride levels greater than 400 mg/dL invalidate calculated LDL results. . Reference range: <100 . Desirable range <100 mg/dL for primary prevention;   <70 mg/dL for patients with CHD or diabetic patients  with >  or = 2 CHD risk factors. Marland Kitchen LDL-C is now calculated using the Martin-Hopkins  calculation, which is a validated novel method providing  better accuracy than the Friedewald equation in the  estimation of LDL-C.  Horald Pollen et al. Lenox Ahr. 1610;960(45): 2061-2068   (http://education.[facility].com/faq/FAQ164)    TRIG 2,705 (H) 01/29/2018   CHOLHDL 11.6 (H) 01/29/2018     He has been working on diet and exercise for prediabetes, and denies increased appetite, nausea, paresthesia of the feet, polydipsia, polyuria, visual disturbances and vomiting. Last A1C in the office was:  Lab Results  Component Value Date   HGBA1C 5.8 (H) 01/29/2018   Patient reports he is taking vit D supplement (8000 IU) last check was very low:  Lab Results  Component Value Date   VD25OH 18 (L) 01/29/2018    He is not taking a PPI or other acid reflux medication   Current Medications:  Current Outpatient Medications on File Prior to Visit  Medication Sig  . atorvastatin (LIPITOR) 80 MG tablet TAKE 1/2 TO 1 TABLET DAILY OR AS DIRECTED FOR CHOLESTEROL (Patient taking differently: TAKE 1/2 TABLET DAILY OR AS DIRECTED FOR CHOLESTEROL)  . bisoprolol-hydrochlorothiazide (ZIAC) 5-6.25 MG tablet TAKE 1 TABLET EVERY MORNING FOR BLOOD PRESSURE  . Cholecalciferol (VITAMIN D PO) Take 8,000 Int'l Units by mouth daily.  Marland Kitchen lisinopril (PRINIVIL,ZESTRIL) 40 MG tablet TAKE 1/2-1 TABLET BY MOUTH DAILY AS DIRECTED FOR BP (Patient taking differently: TAKE 1/2 TABLET BY MOUTH DAILY AS DIRECTED FOR BP)  . rosuvastatin (CRESTOR) 40 MG tablet Take 1/2 to 1 tablet daily or as directed for Cholesterol   No current facility-administered medications on file prior to visit.     Allergies:  Allergies  Allergen Reactions  . Fenofibrate Nausea Only  . Penicillins Other (See Comments)    Unknown     Medical History:  Past Medical History:  Diagnosis Date  . 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 Hypertension   . Kidney stones   . Plantar fasciitis   . Prediabetes    Family history- Reviewed and unchanged Social history- Reviewed and unchanged   Review of Systems:  Review of Systems  Constitutional: Negative for malaise/fatigue and weight loss.  HENT: Negative for  hearing loss and tinnitus.   Eyes: Negative for blurred vision and double vision.  Respiratory: Negative for cough, shortness of breath and wheezing.   Cardiovascular: Negative for chest pain, palpitations, orthopnea, claudication and leg swelling.  Gastrointestinal: Negative for abdominal pain, blood in stool, constipation, diarrhea, heartburn, melena, nausea and vomiting.  Genitourinary: Negative.   Musculoskeletal: Positive for joint pain. Negative for myalgias.       Radicular pain through left hip and leg; intermittent; improved  Skin: Negative for rash.  Neurological: Negative for dizziness, tingling, sensory change, weakness and headaches.  Endo/Heme/Allergies: Negative for polydipsia.  Psychiatric/Behavioral: Negative.   All other systems reviewed and are negative.   Physical Exam: BP (!) 140/100   Resp 18   Ht 6' (1.829 m)   Wt 243 lb 12.8 oz (110.6 kg)   BMI 33.07 kg/m  Wt Readings from Last 3 Encounters:  05/29/18 243 lb 12.8 oz (110.6 kg)  04/28/18 238 lb 12.8 oz (108.3 kg)  03/28/18 244 lb (110.7 kg)   General Appearance: Well nourished, in no apparent distress. Eyes: PERRLA, EOMs, conjunctiva no swelling or erythema Sinuses: No Frontal/maxillary tenderness ENT/Mouth: Ext aud canals clear, TMs without erythema, bulging. No erythema, swelling, or exudate on post pharynx.  Tonsils not swollen or erythematous. Hearing normal.  Neck: Supple, thyroid normal.  Respiratory: Respiratory effort normal, BS equal bilaterally without rales, rhonchi, wheezing or stridor.  Cardio: RRR with no MRGs. Brisk peripheral pulses without edema.  Abdomen: Soft, + BS.  Non tender, no guarding, rebound, hernias, masses. Lymphatics: Non tender without lymphadenopathy.  Musculoskeletal: Full ROM, 5/5 strength, Normal gait Skin: Warm, dry; left cheek with ~8 mm ulcerated/scabbed round lesion; right buttock with open/draining abscess without fluctuance near cleft Neuro: Cranial nerves intact.  No cerebellar symptoms.  Psych: Awake and oriented X 3, normal affect, Insight and Judgment appropriate.    Dan Maker, NP 10:39 AM [facility]

## 2018-05-29 ENCOUNTER — Ambulatory Visit: Payer: Managed Care, Other (non HMO) | Admitting: Adult Health

## 2018-05-29 ENCOUNTER — Encounter: Payer: Self-pay | Admitting: Adult Health

## 2018-05-29 VITALS — BP 140/100 | Resp 18 | Ht 72.0 in | Wt 243.8 lb

## 2018-05-29 DIAGNOSIS — E782 Mixed hyperlipidemia: Secondary | ICD-10-CM | POA: Diagnosis not present

## 2018-05-29 DIAGNOSIS — E559 Vitamin D deficiency, unspecified: Secondary | ICD-10-CM

## 2018-05-29 DIAGNOSIS — L989 Disorder of the skin and subcutaneous tissue, unspecified: Secondary | ICD-10-CM

## 2018-05-29 DIAGNOSIS — I10 Essential (primary) hypertension: Secondary | ICD-10-CM

## 2018-05-29 DIAGNOSIS — L0231 Cutaneous abscess of buttock: Secondary | ICD-10-CM

## 2018-05-29 DIAGNOSIS — R7303 Prediabetes: Secondary | ICD-10-CM

## 2018-05-29 DIAGNOSIS — E669 Obesity, unspecified: Secondary | ICD-10-CM

## 2018-05-29 DIAGNOSIS — Z79899 Other long term (current) drug therapy: Secondary | ICD-10-CM

## 2018-05-29 MED ORDER — LISINOPRIL 40 MG PO TABS: 40.0000 mg | ORAL_TABLET | Freq: Every day | ORAL | 1 refills | Status: DC

## 2018-05-29 MED ORDER — DOXYCYCLINE HYCLATE 100 MG PO CAPS: ORAL_CAPSULE | ORAL | 0 refills | Status: DC

## 2018-05-29 NOTE — Patient Instructions (Addendum)
Increase lisinopril to full tab  Call back with gabapentin dose  Check blood pressure regularly, several times a week or daily, check weight at least weekly, aim to lose 1-4 lb per month, stop drinking soda  Continue sitz baths until abscess resolves; monitor and call if not resolving  Try to wear 100% cotton underwear, shower daily, keep area clean  Monitor your blood pressure at home, please keep a record and bring that in with you to your next office visit.   Start taking fish oil/omega 3 supplement - max dose per bottle - daily    Goal is <130/80  Go to the ER if any CP, SOB, nausea, dizziness, severe HA, changes vision/speech  Your most recent BP: BP: (!) 140/100   Take your medications faithfully as instructed. Maintain a healthy weight. Get at least 150 minutes of aerobic exercise per week. Minimize salt intake. Minimize alcohol intake  DASH Eating Plan DASH stands for "Dietary Approaches to Stop Hypertension." The DASH eating plan is a healthy eating plan that has been shown to reduce high blood pressure (hypertension). Additional health benefits may include reducing the risk of type 2 diabetes mellitus, heart disease, and stroke. The DASH eating plan may also help with weight loss. WHAT DO I NEED TO KNOW ABOUT THE DASH EATING PLAN? For the DASH eating plan, you will follow these general guidelines:  Choose foods with a percent daily value for sodium of less than 5% (as listed on the food label).  Use salt-free seasonings or herbs instead of table salt or sea salt.  Check with your health care provider or pharmacist before using salt substitutes.  Eat lower-sodium products, often labeled as "lower sodium" or "no salt added."  Eat fresh foods.  Eat more vegetables, fruits, and low-fat dairy products.  Choose whole grains. Look for the word "whole" as the first word in the ingredient list.  Choose fish and skinless chicken or [place] more often than red meat.  Limit fish, poultry, and meat to 6 oz (170 g) each day.  Limit sweets, desserts, sugars, and sugary drinks.  Choose heart-healthy fats.  Limit cheese to 1 oz (28 g) per day.  Eat more home-cooked food and less restaurant, buffet, and fast food.  Limit fried foods.  Cook foods using methods other than frying.  Limit canned vegetables. If you do use them, rinse them well to decrease the sodium.  When eating at a restaurant, ask that your food be prepared with less salt, or no salt if possible. WHAT FOODS CAN I EAT? Seek help from a dietitian for individual calorie needs. Grains Whole grain or whole wheat bread. Brown rice. Whole grain or whole wheat pasta. Quinoa, bulgur, and whole grain cereals. Low-sodium cereals. Corn or whole wheat flour tortillas. Whole grain cornbread. Whole grain crackers. Low-sodium crackers. Vegetables Fresh or frozen vegetables (raw, steamed, roasted, or grilled). Low-sodium or reduced-sodium tomato and vegetable juices. Low-sodium or reduced-sodium tomato sauce and paste. Low-sodium or reduced-sodium canned vegetables.  Fruits All fresh, canned (in natural juice), or frozen fruits. Meat and Other Protein Products Ground beef (85% or leaner), grass-fed beef, or beef trimmed of fat. Skinless chicken or [place]. Ground chicken or [place]. Pork trimmed of fat. All fish and seafood. Eggs. Dried beans, peas, or lentils. Unsalted nuts and seeds. Unsalted canned beans. Dairy Low-fat dairy products, such as skim or 1% milk, 2% or reduced-fat cheeses, low-fat ricotta or cottage cheese, or plain low-fat yogurt. Low-sodium or reduced-sodium cheeses. Fats and  Oils Tub margarines without trans fats. Light or reduced-fat mayonnaise and salad dressings (reduced sodium). Avocado. Safflower, olive, or canola oils. Natural peanut or almond butter. Other Unsalted popcorn and pretzels. The items listed above may not be a complete list of recommended foods or beverages. Contact  your dietitian for more options. WHAT FOODS ARE NOT RECOMMENDED? Grains White bread. White pasta. White rice. Refined cornbread. Bagels and croissants. Crackers that contain trans fat. Vegetables Creamed or fried vegetables. Vegetables in a cheese sauce. Regular canned vegetables. Regular canned tomato sauce and paste. Regular tomato and vegetable juices. Fruits Dried fruits. Canned fruit in light or heavy syrup. Fruit juice. Meat and Other Protein Products Fatty cuts of meat. Ribs, chicken wings, bacon, sausage, bologna, salami, chitterlings, fatback, hot dogs, bratwurst, and packaged luncheon meats. Salted nuts and seeds. Canned beans with salt. Dairy Whole or 2% milk, cream, half-and-half, and cream cheese. Whole-fat or sweetened yogurt. Full-fat cheeses or blue cheese. Nondairy creamers and whipped toppings. Processed cheese, cheese spreads, or cheese curds. Condiments Onion and garlic salt, seasoned salt, table salt, and sea salt. Canned and packaged gravies. Worcestershire sauce. Tartar sauce. Barbecue sauce. Teriyaki sauce. Soy sauce, including reduced sodium. Steak sauce. Fish sauce. Oyster sauce. Cocktail sauce. Horseradish. Ketchup and mustard. Meat flavorings and tenderizers. Bouillon cubes. Hot sauce. Tabasco sauce. Marinades. Taco seasonings. Relishes. Fats and Oils Butter, stick margarine, lard, shortening, ghee, and bacon fat. Coconut, palm kernel, or palm oils. Regular salad dressings. Other Pickles and olives. Salted popcorn and pretzels. The items listed above may not be a complete list of foods and beverages to avoid. Contact your dietitian for more information. WHERE CAN I FIND MORE INFORMATION? National Heart, Lung, and Blood Institute: travelstabloid.com Document Released: 09/27/2011 Document Revised: 02/22/2014 Document Reviewed: 08/12/2013 [facility] Patient Information 2015 [place], [place]. This information is not intended to replace  advice given to you by your health care provider. Make sure you discuss any questions you have with your health care provider.    Skin Abscess A skin abscess is an infected area on or under your skin that contains a collection of pus and other material. An abscess may also be called a furuncle, carbuncle, or boil. An abscess can occur in or on almost any part of your body. Some abscesses break open (rupture) on their own. Most continue to get worse unless they are treated. The infection can spread deeper into the body and eventually into your blood, which can make you feel ill. Treatment usually involves draining the abscess. What are the causes? An abscess occurs when germs, often bacteria, pass through your skin and cause an infection. This may be caused by:  A scrape or cut on your skin.  A puncture wound through your skin, including a needle injection.  Blocked oil or sweat glands.  Blocked and infected hair follicles.  A cyst that forms beneath your skin (sebaceous cyst) and becomes infected.  What increases the risk? This condition is more likely to develop in people who:  Have a weak body defense system (immune system).  Have diabetes.  Have dry and irritated skin.  Get frequent injections or use illegal IV drugs.  Have a foreign body in a wound, such as a splinter.  Have problems with their lymph system or veins.  What are the signs or symptoms? An abscess may start as a painful, firm bump under the skin. Over time, the abscess may get larger or become softer. Pus may appear at the top of the abscess,  causing pressure and pain. It may eventually break through the skin and drain. Other symptoms include:  Redness.  Warmth.  Swelling.  Tenderness.  A sore on the skin.  How is this diagnosed? This condition is diagnosed based on your medical history and a physical exam. A sample of pus may be taken from the abscess to find out what is causing the infection and  what antibiotics can be used to treat it. You also may have:  Blood tests to look for signs of infection or spread of an infection to your blood.  Imaging studies such as ultrasound, CT scan, or MRI if the abscess is deep.  How is this treated? Small abscesses that drain on their own may not need treatment. Treatment for an abscess that does not rupture on its own may include:  Warm compresses applied to the area several times per day.  Incision and drainage. Your health care provider will make an incision to open the abscess and will remove pus and any foreign body or dead tissue. The incision area may be packed with gauze to keep it open for a few days while it heals.  Antibiotic medicines to treat infection. For a severe abscess, you may first get antibiotics through an IV and then change to oral antibiotics.  Follow these instructions at home: Abscess Care  If you have an abscess that has not drained, place a warm, clean, wet washcloth over the abscess several times a day. Do this as told by your health care provider.  Follow instructions from your health care provider about how to take care of your abscess. Make sure you: ? Cover the abscess with a bandage (dressing). ? Change your dressing or gauze as told by your health care provider. ? Wash your hands with soap and water before you change the dressing or gauze. If soap and water are not available, use hand sanitizer.  Check your abscess every day for signs of a worsening infection. Check for: ? More redness, swelling, or pain. ? More fluid or blood. ? Warmth. ? More pus or a bad smell. Medicines  Take over-the-counter and prescription medicines only as told by your health care provider.  If you were prescribed an antibiotic medicine, take it as told by your health care provider. Do not stop taking the antibiotic even if you start to feel better. General instructions  To avoid spreading the infection: ? Do not share  personal care items, towels, or hot tubs with others. ? Avoid making skin contact with other people.  Keep all follow-up visits as told by your health care provider. This is important. Contact a health care provider if:  You have more redness, swelling, or pain around your abscess.  You have more fluid or blood coming from your abscess.  Your abscess feels warm to the touch.  You have more pus or a bad smell coming from your abscess.  You have a fever.  You have muscle aches.  You have chills or a general ill feeling. Get help right away if:  You have severe pain.  You see red streaks on your skin spreading away from the abscess. This information is not intended to replace advice given to you by your health care provider. Make sure you discuss any questions you have with your health care provider. Document Released: 07/18/2005 Document Revised: 06/03/2016 Document Reviewed: 08/17/2015 Elsevier Interactive Patient Education  2018 [employer].    Doxycycline tablets or capsules What is this medicine? DOXYCYCLINE (  dox i SYE kleen) is a tetracycline antibiotic. It kills certain bacteria or stops their growth. It is used to treat many kinds of infections, like dental, skin, respiratory, and urinary tract infections. It also treats acne, Lyme disease, malaria, and certain sexually transmitted infections. This medicine may be used for other purposes; ask your health care provider or pharmacist if you have questions. COMMON BRAND NAME(S): Acticlate, Adoxa, Adoxa CK, Adoxa Pak, Adoxa TT, Alodox, Avidoxy, Doxal, Mondoxyne NL, Monodox, Morgidox 1x, Morgidox 1x Kit, Morgidox 2x, Morgidox 2x Kit, NutriDox, Ocudox, TARGADOX, Vibra-Tabs, Vibramycin What should I tell my health care provider before I take this medicine? They need to know if you have any of these conditions: -liver disease -long exposure to sunlight like working outdoors -stomach problems like colitis -an unusual or allergic  reaction to doxycycline, tetracycline antibiotics, other medicines, foods, dyes, or preservatives -pregnant or trying to get pregnant -breast-feeding How should I use this medicine? Take this medicine by mouth with a full glass of water. Follow the directions on the prescription label. It is best to take this medicine without food, but if it upsets your stomach take it with food. Take your medicine at regular intervals. Do not take your medicine more often than directed. Take all of your medicine as directed even if you think you are better. Do not skip doses or stop your medicine early. Talk to your pediatrician regarding the use of this medicine in children. While this drug may be prescribed for selected conditions, precautions do apply. Overdosage: If you think you have taken too much of this medicine contact a poison control center or emergency room at once. NOTE: This medicine is only for you. Do not share this medicine with others. What if I miss a dose? If you miss a dose, take it as soon as you can. If it is almost time for your next dose, take only that dose. Do not take double or extra doses. What may interact with this medicine? -antacids -barbiturates -birth control pills -bismuth subsalicylate -carbamazepine -methoxyflurane -other antibiotics -phenytoin -vitamins that contain iron -warfarin This list may not describe all possible interactions. Give your health care provider a list of all the medicines, herbs, non-prescription drugs, or dietary supplements you use. Also tell them if you smoke, drink alcohol, or use illegal drugs. Some items may interact with your medicine. What should I watch for while using this medicine? Tell your doctor or health care professional if your symptoms do not improve. Do not treat diarrhea with over the counter products. Contact your doctor if you have diarrhea that lasts more than 2 days or if it is severe and watery. Do not take this medicine  just before going to bed. It may not dissolve properly when you lay down and can cause pain in your throat. Drink plenty of fluids while taking this medicine to also help reduce irritation in your throat. This medicine can make you more sensitive to the sun. Keep out of the sun. If you cannot avoid being in the sun, wear protective clothing and use sunscreen. Do not use sun lamps or tanning beds/booths. Birth control pills may not work properly while you are taking this medicine. Talk to your doctor about using an extra method of birth control. If you are being treated for a sexually transmitted infection, avoid sexual contact until you have finished your treatment. Your sexual partner may also need treatment. Avoid antacids, aluminum, calcium, magnesium, and iron products for 4 hours before and 2  hours after taking a dose of this medicine. If you are using this medicine to prevent malaria, you should still protect yourself from contact with mosquitos. Stay in screened-in areas, use mosquito nets, keep your body covered, and use an insect repellent. What side effects may I notice from receiving this medicine? Side effects that you should report to your doctor or health care professional as soon as possible: -allergic reactions like skin rash, itching or hives, swelling of the face, lips, or tongue -difficulty breathing -fever -itching in the rectal or genital area -pain on swallowing -redness, blistering, peeling or loosening of the skin, including inside the mouth -severe stomach pain or cramps -unusual bleeding or bruising -unusually weak or tired -yellowing of the eyes or skin Side effects that usually do not require medical attention (report to your doctor or health care professional if they continue or are bothersome): -diarrhea -loss of appetite -nausea, vomiting This list may not describe all possible side effects. Call your doctor for medical advice about side effects. You may report  side effects to FDA at 1-800-FDA-1088. Where should I keep my medicine? Keep out of the reach of children. Store at room temperature, below 30 degrees C (86 degrees F). Protect from light. Keep container tightly closed. Throw away any unused medicine after the expiration date. Taking this medicine after the expiration date can make you seriously ill. NOTE: This sheet is a summary. It may not cover all possible information. If you have questions about this medicine, talk to your doctor, pharmacist, or health care provider.  2018 Elsevier/Gold Standard (2015-11-09 17:11:22)

## 2018-05-30 ENCOUNTER — Encounter: Payer: Self-pay | Admitting: Adult Health

## 2018-05-30 ENCOUNTER — Other Ambulatory Visit: Payer: Self-pay | Admitting: Adult Health

## 2018-05-30 DIAGNOSIS — D649 Anemia, unspecified: Secondary | ICD-10-CM | POA: Insufficient documentation

## 2018-05-30 LAB — CBC WITH DIFFERENTIAL/PLATELET
BASOS PCT: 0.5 %
Basophils Absolute: 22 cells/uL (ref 0–200)
EOS PCT: 1.4 %
Eosinophils Absolute: 62 cells/uL (ref 15–500)
HCT: 37.4 % — ABNORMAL LOW (ref 38.5–50.0)
Hemoglobin: 12.9 g/dL — ABNORMAL LOW (ref 13.2–17.1)
Lymphs Abs: 906 cells/uL (ref 850–3900)
MCH: 34 pg — ABNORMAL HIGH (ref 27.0–33.0)
MCHC: 34.5 g/dL (ref 32.0–36.0)
MCV: 98.7 fL (ref 80.0–100.0)
MPV: 10.6 fL (ref 7.5–12.5)
Monocytes Relative: 14.7 %
NEUTROS PCT: 62.8 %
Neutro Abs: 2763 cells/uL (ref 1500–7800)
Platelets: 99 10*3/uL — ABNORMAL LOW (ref 140–400)
RBC: 3.79 10*6/uL — AB (ref 4.20–5.80)
RDW: 12.8 % (ref 11.0–15.0)
TOTAL LYMPHOCYTE: 20.6 %
WBC mixed population: 647 cells/uL (ref 200–950)
WBC: 4.4 10*3/uL (ref 3.8–10.8)

## 2018-05-30 LAB — COMPLETE METABOLIC PANEL WITH GFR
AG Ratio: 1.9 (calc) (ref 1.0–2.5)
ALKALINE PHOSPHATASE (APISO): 117 U/L — AB (ref 40–115)
ALT: 24 U/L (ref 9–46)
AST: 22 U/L (ref 10–35)
Albumin: 4.1 g/dL (ref 3.6–5.1)
BUN: 25 mg/dL (ref 7–25)
CALCIUM: 9.1 mg/dL (ref 8.6–10.3)
CO2: 29 mmol/L (ref 20–32)
CREATININE: 1.16 mg/dL (ref 0.70–1.33)
Chloride: 103 mmol/L (ref 98–110)
GFR, EST NON AFRICAN AMERICAN: 72 mL/min/{1.73_m2} (ref >=60)
GFR, Est African American: 83 mL/min/{1.73_m2} (ref >=60)
GLUCOSE: 98 mg/dL (ref 65–99)
Globulin: 2.2 g/dL (calc) (ref 1.9–3.7)
Potassium: 4.3 mmol/L (ref 3.5–5.3)
Sodium: 141 mmol/L (ref 135–146)
Total Bilirubin: 0.8 mg/dL (ref 0.2–1.2)
Total Protein: 6.3 g/dL (ref 6.1–8.1)

## 2018-05-30 LAB — LIPID PANEL
Cholesterol: 203 mg/dL — ABNORMAL HIGH (ref <200)
HDL: 75 mg/dL (ref >40)
LDL CHOLESTEROL (CALC): 105 mg/dL — AB
Non-HDL Cholesterol (Calc): 128 mg/dL (calc) (ref <130)
TRIGLYCERIDES: 124 mg/dL (ref <150)
Total CHOL/HDL Ratio: 2.7 (calc) (ref <5.0)

## 2018-05-30 LAB — HEMOGLOBIN A1C
HEMOGLOBIN A1C: 5.6 %{Hb} (ref <5.7)
MEAN PLASMA GLUCOSE: 114 (calc)
eAG (mmol/L): 6.3 (calc)

## 2018-05-30 LAB — VITAMIN D 25 HYDROXY (VIT D DEFICIENCY, FRACTURES): Vit D, 25-Hydroxy: 39 ng/mL (ref 30–100)

## 2018-05-30 LAB — TSH: TSH: 2.86 mIU/L (ref 0.40–4.50)

## 2018-06-10 ENCOUNTER — Other Ambulatory Visit: Payer: Self-pay

## 2018-08-05 ENCOUNTER — Other Ambulatory Visit: Payer: Self-pay | Admitting: Adult Health

## 2018-08-05 ENCOUNTER — Other Ambulatory Visit: Payer: Self-pay | Admitting: Internal Medicine

## 2018-08-05 DIAGNOSIS — E782 Mixed hyperlipidemia: Secondary | ICD-10-CM

## 2018-08-26 ENCOUNTER — Encounter: Payer: Self-pay | Admitting: Internal Medicine

## 2018-12-08 IMAGING — CT CT RENAL STONE PROTOCOL
2 of 4 series · 16 of 46 positions shown, 18 images · non-contrast
Comparison: CT abdomen and pelvis February 13, 2014

CLINICAL DATA: LEFT leg pain for 2 hours. Hematuria. Similar
symptoms associated with prior kidney stones.

EXAM:
CT ABDOMEN AND PELVIS WITHOUT CONTRAST
TECHNIQUE: Multidetector CT imaging of the abdomen and pelvis was performed
following the standard protocol without IV contrast.

[Series 3: renal stone 5.0 · axial · 0.76mm/px · z∈[-399,+81]mm · 13 of 106 slices shown, 15 images]
[im 5/106  soft-tissue]
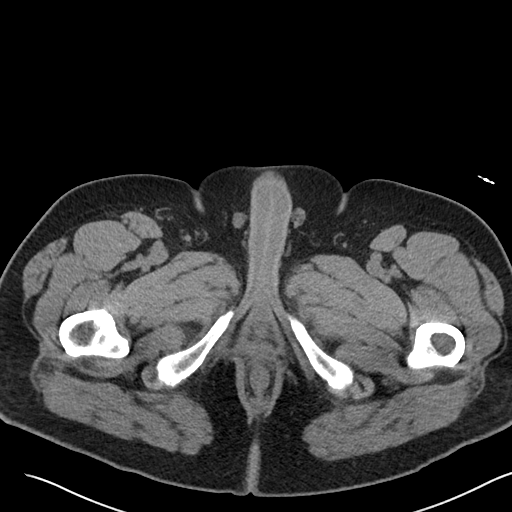
[im 5/106  bone]
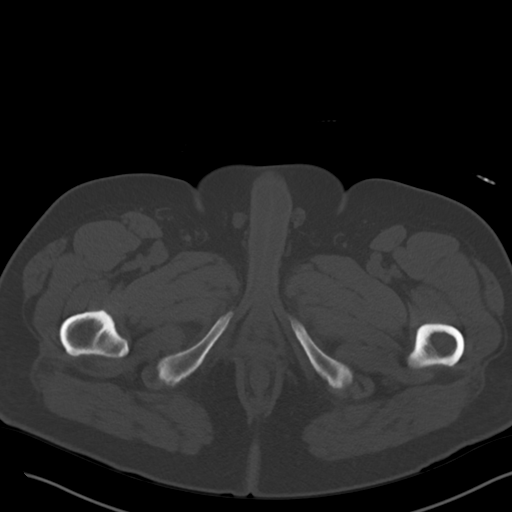
[im 13/106  soft-tissue]
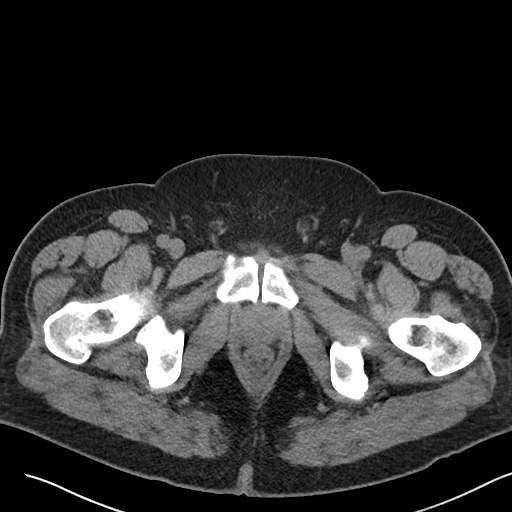
[im 21/106  soft-tissue]
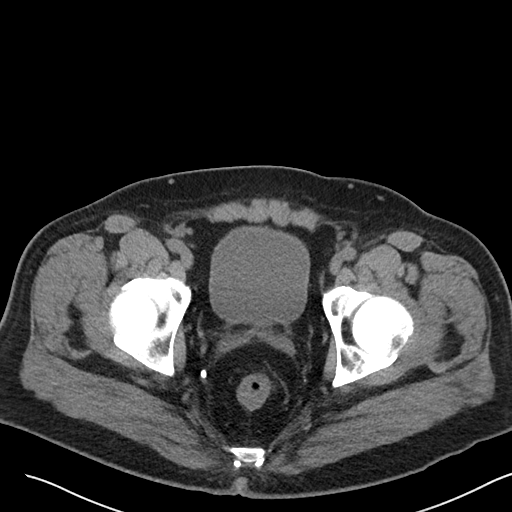
[im 29/106  soft-tissue]
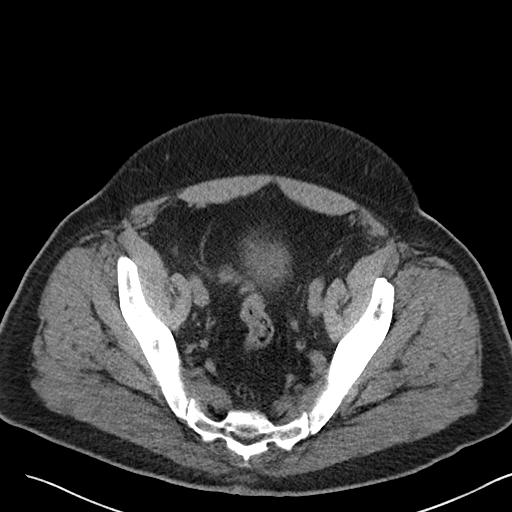
[im 37/106  soft-tissue]
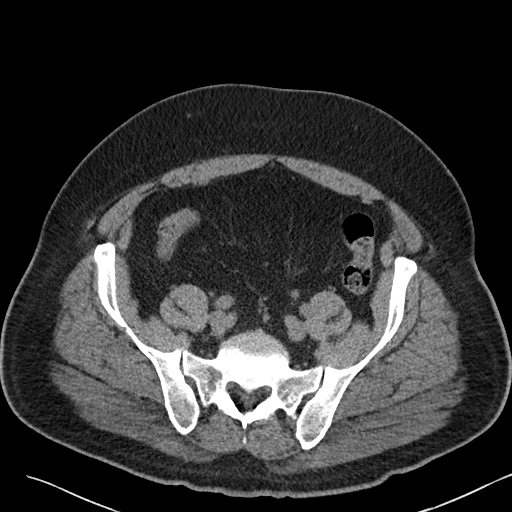
[im 45/106  soft-tissue]
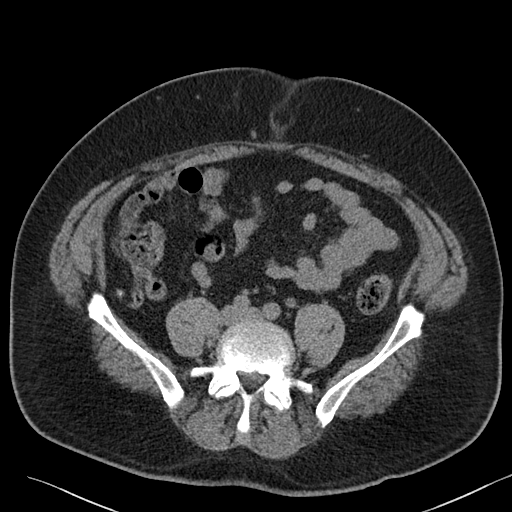
[im 53/106  soft-tissue]
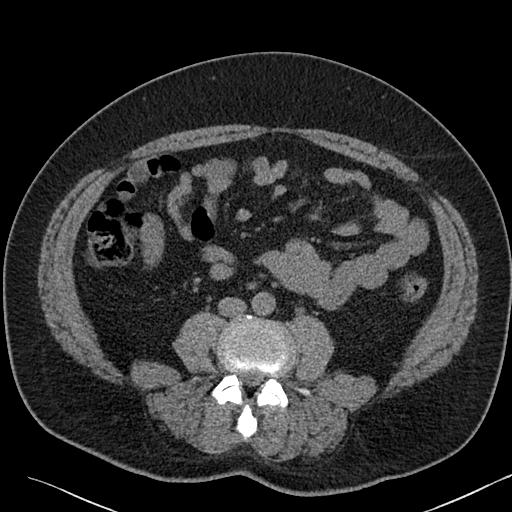
[im 61/106  soft-tissue]
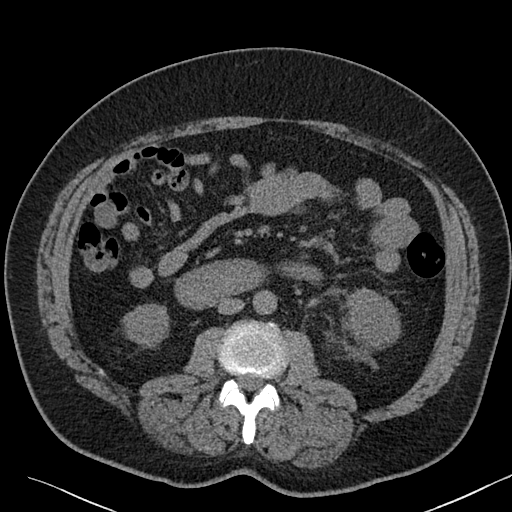
[im 69/106  soft-tissue]
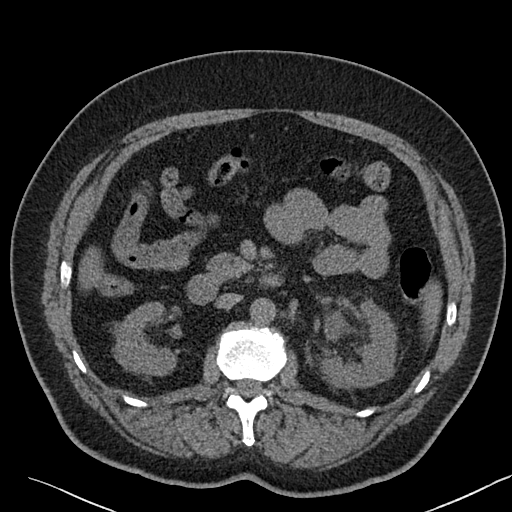
[im 69/106  bone]
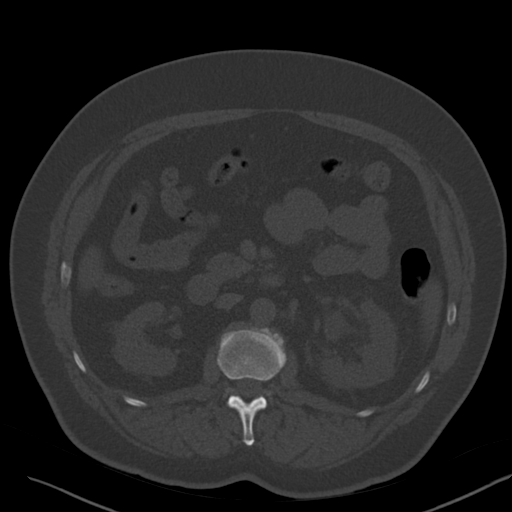
[im 77/106  soft-tissue]
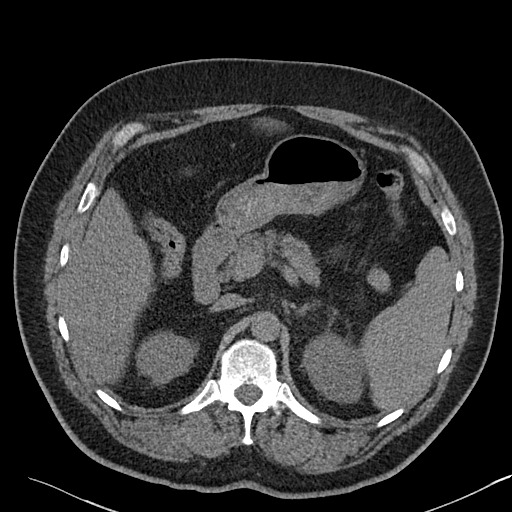
[im 85/106  soft-tissue]
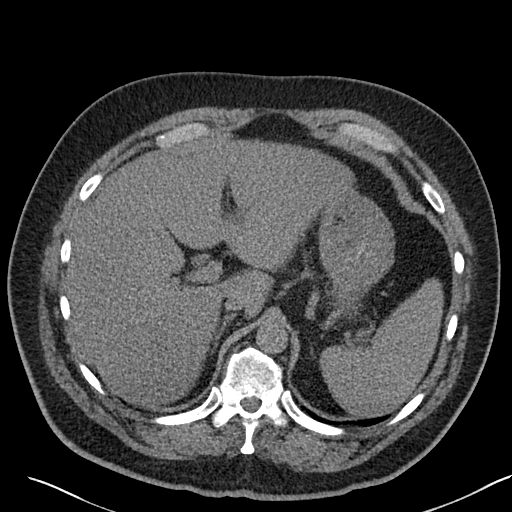
[im 93/106  soft-tissue]
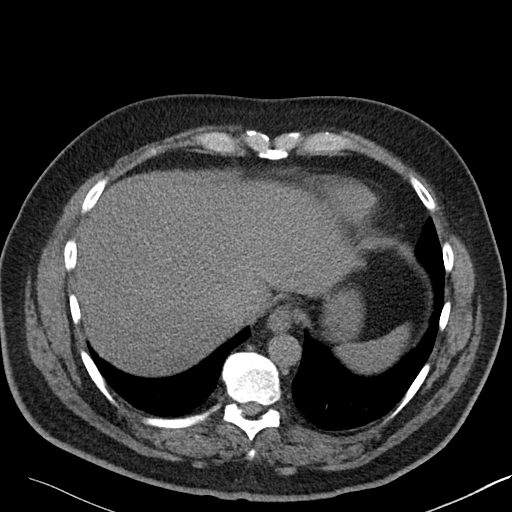
[im 101/106  soft-tissue]
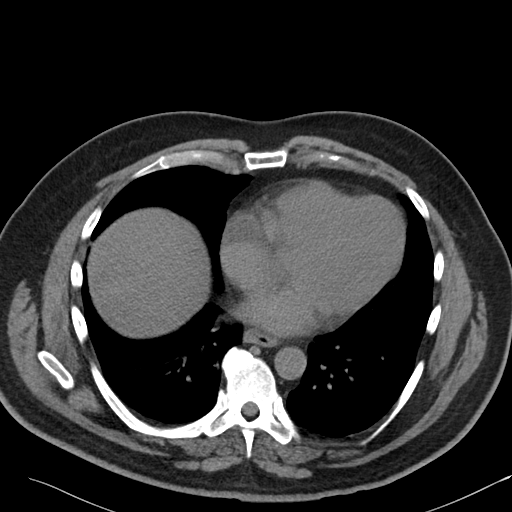

[Series 6: renal stone 3.0 cor · coronal · 0.79mm/px · 3 of 109 slices shown]
[im 37/109  soft-tissue]
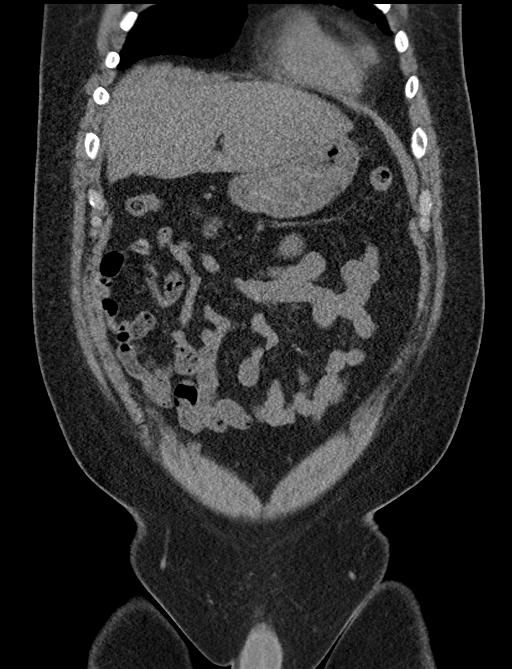
[im 49/109  soft-tissue]
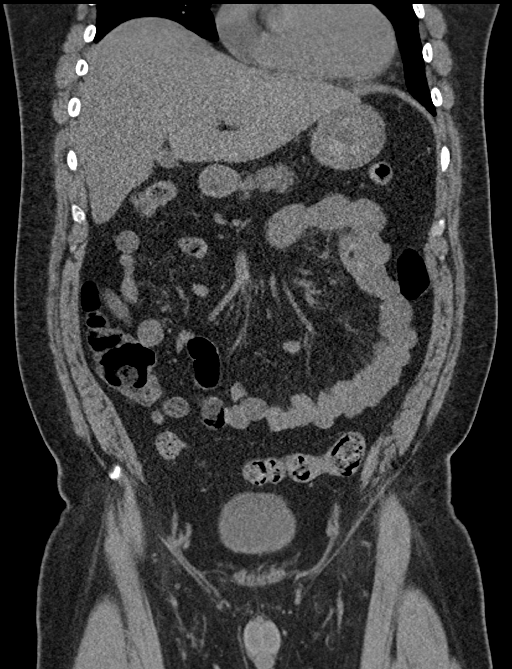
[im 61/109  soft-tissue]
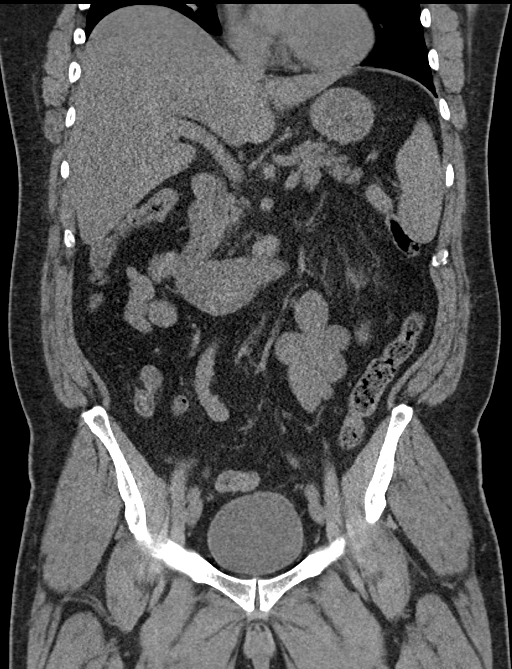

[16 of 46 positions shown; findings below may reference images not displayed]

FINDINGS: LOWER CHEST: RIGHT lower lobe atelectasis/scarring. The visualized
heart size is normal. No pericardial effusion.

HEPATOBILIARY: Normal.

PANCREAS: Normal.

SPLEEN: Normal.

ADRENALS/URINARY TRACT: Kidneys are orthotopic, demonstrating normal
size and morphology. Mild LEFT hydroureteronephrosis to the level
the distal ureter where a 4 mm calculus is present. Punctate RIGHT
upper and RIGHT lower pole, 3 mm RIGHT interpolar, 4 mm LEFT upper
pole, punctate LEFT interpolar and lower pole nephrolithiasis.
Limited assessment for renal masses on this nonenhanced examination.
Urinary bladder is well distended and unremarkable. Normal adrenal
glands.

STOMACH/BOWEL: The stomach, small and large bowel are normal in
course and caliber without inflammatory changes, sensitivity
decreased by lack of enteric contrast. Subcentimeter sigmoid colonic
lipoma versus enteric contents. Normal appendix.

VASCULAR/LYMPHATIC: Aortoiliac vessels are normal in course and
caliber. No lymphadenopathy by CT size criteria.

REPRODUCTIVE: Normal.

OTHER: No intraperitoneal free fluid or free air. Small fat
containing umbilical hernia.

MUSCULOSKELETAL: Non-acute. L5-S1 endplate spurring resulting in
moderate LEFT L5-S1 neural foraminal narrowing.
IMPRESSION: 1. 4 mm distal LEFT ureteral calculus resulting in mild obstructive
uropathy.
2. Bilateral nephrolithiasis measuring to 4 mm.

## 2019-02-04 ENCOUNTER — Other Ambulatory Visit: Payer: Self-pay | Admitting: Adult Health

## 2019-02-04 DIAGNOSIS — E782 Mixed hyperlipidemia: Secondary | ICD-10-CM

## 2019-08-31 ENCOUNTER — Other Ambulatory Visit: Payer: Self-pay | Admitting: Internal Medicine

## 2019-08-31 DIAGNOSIS — E782 Mixed hyperlipidemia: Secondary | ICD-10-CM

## 2019-09-11 ENCOUNTER — Encounter: Payer: Self-pay | Admitting: Internal Medicine

## 2022-05-13 ENCOUNTER — Encounter (HOSPITAL_COMMUNITY): Payer: Self-pay | Admitting: Pulmonary Disease

## 2022-05-13 ENCOUNTER — Inpatient Hospital Stay (HOSPITAL_COMMUNITY): Payer: Managed Care, Other (non HMO)

## 2022-05-13 ENCOUNTER — Emergency Department (HOSPITAL_COMMUNITY): Payer: Managed Care, Other (non HMO)

## 2022-05-13 ENCOUNTER — Other Ambulatory Visit: Payer: Self-pay

## 2022-05-13 ENCOUNTER — Inpatient Hospital Stay (HOSPITAL_COMMUNITY)
Admission: EM | Admit: 2022-05-13 | Discharge: 2022-06-22 | DRG: 207 | Disposition: E | Payer: Managed Care, Other (non HMO) | Source: Ambulatory Visit | Attending: Pulmonary Disease | Admitting: Pulmonary Disease

## 2022-05-13 DIAGNOSIS — J9500 Unspecified tracheostomy complication: Secondary | ICD-10-CM | POA: Diagnosis not present

## 2022-05-13 DIAGNOSIS — Z66 Do not resuscitate: Secondary | ICD-10-CM | POA: Diagnosis not present

## 2022-05-13 DIAGNOSIS — D696 Thrombocytopenia, unspecified: Secondary | ICD-10-CM | POA: Diagnosis not present

## 2022-05-13 DIAGNOSIS — Z6831 Body mass index (BMI) 31.0-31.9, adult: Secondary | ICD-10-CM

## 2022-05-13 DIAGNOSIS — R739 Hyperglycemia, unspecified: Secondary | ICD-10-CM | POA: Diagnosis present

## 2022-05-13 DIAGNOSIS — R7303 Prediabetes: Secondary | ICD-10-CM | POA: Diagnosis present

## 2022-05-13 DIAGNOSIS — Z9911 Dependence on respirator [ventilator] status: Secondary | ICD-10-CM | POA: Diagnosis not present

## 2022-05-13 DIAGNOSIS — M1711 Unilateral primary osteoarthritis, right knee: Secondary | ICD-10-CM | POA: Diagnosis present

## 2022-05-13 DIAGNOSIS — I700 Atherosclerosis of aorta: Secondary | ICD-10-CM | POA: Diagnosis present

## 2022-05-13 DIAGNOSIS — N179 Acute kidney failure, unspecified: Secondary | ICD-10-CM | POA: Diagnosis not present

## 2022-05-13 DIAGNOSIS — Z880 Allergy status to penicillin: Secondary | ICD-10-CM

## 2022-05-13 DIAGNOSIS — J9621 Acute and chronic respiratory failure with hypoxia: Secondary | ICD-10-CM | POA: Diagnosis present

## 2022-05-13 DIAGNOSIS — Z515 Encounter for palliative care: Secondary | ICD-10-CM

## 2022-05-13 DIAGNOSIS — S01512A Laceration without foreign body of oral cavity, initial encounter: Secondary | ICD-10-CM | POA: Diagnosis present

## 2022-05-13 DIAGNOSIS — M436 Torticollis: Secondary | ICD-10-CM | POA: Diagnosis not present

## 2022-05-13 DIAGNOSIS — G40509 Epileptic seizures related to external causes, not intractable, without status epilepticus: Secondary | ICD-10-CM | POA: Diagnosis present

## 2022-05-13 DIAGNOSIS — L89326 Pressure-induced deep tissue damage of left buttock: Secondary | ICD-10-CM | POA: Diagnosis not present

## 2022-05-13 DIAGNOSIS — J95851 Ventilator associated pneumonia: Secondary | ICD-10-CM | POA: Diagnosis present

## 2022-05-13 DIAGNOSIS — E669 Obesity, unspecified: Secondary | ICD-10-CM | POA: Diagnosis present

## 2022-05-13 DIAGNOSIS — I119 Hypertensive heart disease without heart failure: Secondary | ICD-10-CM | POA: Diagnosis present

## 2022-05-13 DIAGNOSIS — R6521 Severe sepsis with septic shock: Secondary | ICD-10-CM | POA: Diagnosis present

## 2022-05-13 DIAGNOSIS — E7220 Disorder of urea cycle metabolism, unspecified: Secondary | ICD-10-CM | POA: Diagnosis not present

## 2022-05-13 DIAGNOSIS — E861 Hypovolemia: Secondary | ICD-10-CM | POA: Diagnosis not present

## 2022-05-13 DIAGNOSIS — Z87442 Personal history of urinary calculi: Secondary | ICD-10-CM

## 2022-05-13 DIAGNOSIS — R579 Shock, unspecified: Secondary | ICD-10-CM | POA: Diagnosis not present

## 2022-05-13 DIAGNOSIS — J9600 Acute respiratory failure, unspecified whether with hypoxia or hypercapnia: Secondary | ICD-10-CM | POA: Diagnosis not present

## 2022-05-13 DIAGNOSIS — A409 Streptococcal sepsis, unspecified: Secondary | ICD-10-CM | POA: Diagnosis present

## 2022-05-13 DIAGNOSIS — R451 Restlessness and agitation: Secondary | ICD-10-CM | POA: Diagnosis present

## 2022-05-13 DIAGNOSIS — Z79899 Other long term (current) drug therapy: Secondary | ICD-10-CM

## 2022-05-13 DIAGNOSIS — G9340 Encephalopathy, unspecified: Secondary | ICD-10-CM | POA: Diagnosis not present

## 2022-05-13 DIAGNOSIS — J15211 Pneumonia due to Methicillin susceptible Staphylococcus aureus: Secondary | ICD-10-CM | POA: Diagnosis present

## 2022-05-13 DIAGNOSIS — G928 Other toxic encephalopathy: Secondary | ICD-10-CM | POA: Diagnosis present

## 2022-05-13 DIAGNOSIS — E872 Acidosis, unspecified: Secondary | ICD-10-CM | POA: Diagnosis not present

## 2022-05-13 DIAGNOSIS — L8990 Pressure ulcer of unspecified site, unspecified stage: Secondary | ICD-10-CM | POA: Insufficient documentation

## 2022-05-13 DIAGNOSIS — J690 Pneumonitis due to inhalation of food and vomit: Secondary | ICD-10-CM | POA: Diagnosis not present

## 2022-05-13 DIAGNOSIS — E876 Hypokalemia: Secondary | ICD-10-CM | POA: Diagnosis not present

## 2022-05-13 DIAGNOSIS — I468 Cardiac arrest due to other underlying condition: Secondary | ICD-10-CM | POA: Diagnosis not present

## 2022-05-13 DIAGNOSIS — R4020 Unspecified coma: Secondary | ICD-10-CM | POA: Diagnosis not present

## 2022-05-13 DIAGNOSIS — Z20822 Contact with and (suspected) exposure to covid-19: Secondary | ICD-10-CM | POA: Diagnosis present

## 2022-05-13 DIAGNOSIS — G253 Myoclonus: Secondary | ICD-10-CM | POA: Diagnosis not present

## 2022-05-13 DIAGNOSIS — J9501 Hemorrhage from tracheostomy stoma: Secondary | ICD-10-CM | POA: Diagnosis present

## 2022-05-13 DIAGNOSIS — G931 Anoxic brain damage, not elsewhere classified: Secondary | ICD-10-CM | POA: Diagnosis not present

## 2022-05-13 DIAGNOSIS — Z888 Allergy status to other drugs, medicaments and biological substances status: Secondary | ICD-10-CM

## 2022-05-13 DIAGNOSIS — F1010 Alcohol abuse, uncomplicated: Secondary | ICD-10-CM | POA: Diagnosis present

## 2022-05-13 DIAGNOSIS — J9601 Acute respiratory failure with hypoxia: Secondary | ICD-10-CM | POA: Diagnosis not present

## 2022-05-13 DIAGNOSIS — R569 Unspecified convulsions: Secondary | ICD-10-CM | POA: Diagnosis not present

## 2022-05-13 DIAGNOSIS — E440 Moderate protein-calorie malnutrition: Secondary | ICD-10-CM | POA: Diagnosis present

## 2022-05-13 DIAGNOSIS — R34 Anuria and oliguria: Secondary | ICD-10-CM | POA: Diagnosis not present

## 2022-05-13 DIAGNOSIS — Y848 Other medical procedures as the cause of abnormal reaction of the patient, or of later complication, without mention of misadventure at the time of the procedure: Secondary | ICD-10-CM | POA: Diagnosis present

## 2022-05-13 DIAGNOSIS — E8809 Other disorders of plasma-protein metabolism, not elsewhere classified: Secondary | ICD-10-CM | POA: Diagnosis not present

## 2022-05-13 DIAGNOSIS — R69 Illness, unspecified: Secondary | ICD-10-CM

## 2022-05-13 DIAGNOSIS — G9341 Metabolic encephalopathy: Secondary | ICD-10-CM | POA: Diagnosis not present

## 2022-05-13 DIAGNOSIS — Z87891 Personal history of nicotine dependence: Secondary | ICD-10-CM

## 2022-05-13 DIAGNOSIS — L89316 Pressure-induced deep tissue damage of right buttock: Secondary | ICD-10-CM | POA: Diagnosis not present

## 2022-05-13 DIAGNOSIS — D6489 Other specified anemias: Secondary | ICD-10-CM | POA: Diagnosis present

## 2022-05-13 LAB — COMPREHENSIVE METABOLIC PANEL
ALT: 39 U/L (ref 0–44)
AST: 113 U/L — ABNORMAL HIGH (ref 15–41)
Albumin: 2.1 g/dL — ABNORMAL LOW (ref 3.5–5.0)
Alkaline Phosphatase: 160 U/L — ABNORMAL HIGH (ref 38–126)
Anion gap: 9 (ref 5–15)
BUN: 19 mg/dL (ref 6–20)
CO2: 24 mmol/L (ref 22–32)
Calcium: 8.5 mg/dL — ABNORMAL LOW (ref 8.9–10.3)
Chloride: 110 mmol/L (ref 98–111)
Creatinine, Ser: 1.18 mg/dL (ref 0.61–1.24)
GFR, Estimated: >60 mL/min (ref >60)
Glucose, Bld: 180 mg/dL — ABNORMAL HIGH (ref 70–99)
Potassium: 4.7 mmol/L (ref 3.5–5.1)
Sodium: 143 mmol/L (ref 135–145)
Total Bilirubin: 1.3 mg/dL — ABNORMAL HIGH (ref 0.3–1.2)
Total Protein: 6.6 g/dL (ref 6.5–8.1)

## 2022-05-13 LAB — URINALYSIS, ROUTINE W REFLEX MICROSCOPIC
Bilirubin Urine: NEGATIVE
Glucose, UA: NEGATIVE mg/dL
Ketones, ur: NEGATIVE mg/dL
Leukocytes,Ua: NEGATIVE
Nitrite: NEGATIVE
Protein, ur: 100 mg/dL — AB
RBC / HPF: >50 RBC/hpf — ABNORMAL HIGH (ref 0–5)
Specific Gravity, Urine: 1.024 (ref 1.005–1.030)
pH: 5 (ref 5.0–8.0)

## 2022-05-13 LAB — I-STAT VENOUS BLOOD GAS, ED
Acid-Base Excess: 0 mmol/L (ref 0.0–2.0)
Bicarbonate: 25.9 mmol/L (ref 20.0–28.0)
Calcium, Ion: 1.12 mmol/L — ABNORMAL LOW (ref 1.15–1.40)
HCT: 30 % — ABNORMAL LOW (ref 39.0–52.0)
Hemoglobin: 10.2 g/dL — ABNORMAL LOW (ref 13.0–17.0)
O2 Saturation: 40 %
Potassium: 5.3 mmol/L — ABNORMAL HIGH (ref 3.5–5.1)
Sodium: 144 mmol/L (ref 135–145)
TCO2: 27 mmol/L (ref 22–32)
pCO2, Ven: 45.4 mmHg (ref 44–60)
pH, Ven: 7.365 (ref 7.25–7.43)
pO2, Ven: 24 mmHg — CL (ref 32–45)

## 2022-05-13 LAB — CBC WITH DIFFERENTIAL/PLATELET
Abs Immature Granulocytes: 0.95 10*3/uL — ABNORMAL HIGH (ref 0.00–0.07)
Basophils Absolute: 0.3 10*3/uL — ABNORMAL HIGH (ref 0.0–0.1)
Basophils Relative: 1 %
Eosinophils Absolute: 0.2 10*3/uL (ref 0.0–0.5)
Eosinophils Relative: 1 %
HCT: 34.5 % — ABNORMAL LOW (ref 39.0–52.0)
Hemoglobin: 11.5 g/dL — ABNORMAL LOW (ref 13.0–17.0)
Immature Granulocytes: 5 %
Lymphocytes Relative: 22 %
Lymphs Abs: 4.4 10*3/uL — ABNORMAL HIGH (ref 0.7–4.0)
MCH: 33.3 pg (ref 26.0–34.0)
MCHC: 33.3 g/dL (ref 30.0–36.0)
MCV: 100 fL (ref 80.0–100.0)
Monocytes Absolute: 2.6 10*3/uL — ABNORMAL HIGH (ref 0.1–1.0)
Monocytes Relative: 13 %
Neutro Abs: 11.4 10*3/uL — ABNORMAL HIGH (ref 1.7–7.7)
Neutrophils Relative %: 58 %
Platelets: 230 10*3/uL (ref 150–400)
RBC: 3.45 MIL/uL — ABNORMAL LOW (ref 4.22–5.81)
RDW: 20.3 % — ABNORMAL HIGH (ref 11.5–15.5)
WBC: 19.7 10*3/uL — ABNORMAL HIGH (ref 4.0–10.5)
nRBC: 0.4 % — ABNORMAL HIGH (ref 0.0–0.2)

## 2022-05-13 LAB — RESP PANEL BY RT-PCR (FLU A&B, COVID) ARPGX2
Influenza A by PCR: NEGATIVE
Influenza B by PCR: NEGATIVE
SARS Coronavirus 2 by RT PCR: NEGATIVE

## 2022-05-13 LAB — POCT I-STAT 7, (LYTES, BLD GAS, ICA,H+H)
Acid-base deficit: 1 mmol/L (ref 0.0–2.0)
Bicarbonate: 21.7 mmol/L (ref 20.0–28.0)
Calcium, Ion: 1.19 mmol/L (ref 1.15–1.40)
HCT: 29 % — ABNORMAL LOW (ref 39.0–52.0)
Hemoglobin: 9.9 g/dL — ABNORMAL LOW (ref 13.0–17.0)
O2 Saturation: 95 %
Patient temperature: 40
Potassium: 4.9 mmol/L (ref 3.5–5.1)
Sodium: 141 mmol/L (ref 135–145)
TCO2: 23 mmol/L (ref 22–32)
pCO2 arterial: 31.8 mmHg — ABNORMAL LOW (ref 32–48)
pH, Arterial: 7.453 — ABNORMAL HIGH (ref 7.35–7.45)
pO2, Arterial: 84 mmHg (ref 83–108)

## 2022-05-13 LAB — OCCULT BLOOD GASTRIC / DUODENUM (SPECIMEN CUP): Occult Blood, Gastric: POSITIVE — AB

## 2022-05-13 LAB — CBC
HCT: 31.3 % — ABNORMAL LOW (ref 39.0–52.0)
Hemoglobin: 9.8 g/dL — ABNORMAL LOW (ref 13.0–17.0)
MCH: 34.6 pg — ABNORMAL HIGH (ref 26.0–34.0)
MCHC: 31.3 g/dL (ref 30.0–36.0)
MCV: 110.6 fL — ABNORMAL HIGH (ref 80.0–100.0)
Platelets: 235 10*3/uL (ref 150–400)
RBC: 2.83 MIL/uL — ABNORMAL LOW (ref 4.22–5.81)
RDW: 18.4 % — ABNORMAL HIGH (ref 11.5–15.5)
WBC: 19 10*3/uL — ABNORMAL HIGH (ref 4.0–10.5)
nRBC: 0.5 % — ABNORMAL HIGH (ref 0.0–0.2)

## 2022-05-13 LAB — BLOOD PRODUCT ORDER (VERBAL) VERIFICATION

## 2022-05-13 LAB — I-STAT CHEM 8, ED
BUN: 24 mg/dL — ABNORMAL HIGH (ref 6–20)
Calcium, Ion: 1.1 mmol/L — ABNORMAL LOW (ref 1.15–1.40)
Chloride: 109 mmol/L (ref 98–111)
Creatinine, Ser: 1 mg/dL (ref 0.61–1.24)
Glucose, Bld: 155 mg/dL — ABNORMAL HIGH (ref 70–99)
HCT: 30 % — ABNORMAL LOW (ref 39.0–52.0)
Hemoglobin: 10.2 g/dL — ABNORMAL LOW (ref 13.0–17.0)
Potassium: 5.3 mmol/L — ABNORMAL HIGH (ref 3.5–5.1)
Sodium: 143 mmol/L (ref 135–145)
TCO2: 25 mmol/L (ref 22–32)

## 2022-05-13 LAB — GLUCOSE, CAPILLARY
Glucose-Capillary: 161 mg/dL — ABNORMAL HIGH (ref 70–99)
Glucose-Capillary: 176 mg/dL — ABNORMAL HIGH (ref 70–99)
Glucose-Capillary: 184 mg/dL — ABNORMAL HIGH (ref 70–99)

## 2022-05-13 LAB — PROTIME-INR
INR: 1.6 — ABNORMAL HIGH (ref 0.8–1.2)
Prothrombin Time: 18.6 seconds — ABNORMAL HIGH (ref 11.4–15.2)

## 2022-05-13 LAB — AMMONIA: Ammonia: 34 umol/L (ref 9–35)

## 2022-05-13 LAB — CBG MONITORING, ED: Glucose-Capillary: 165 mg/dL — ABNORMAL HIGH (ref 70–99)

## 2022-05-13 LAB — ABO/RH: ABO/RH(D): O POS

## 2022-05-13 LAB — MRSA NEXT GEN BY PCR, NASAL: MRSA by PCR Next Gen: NOT DETECTED

## 2022-05-13 LAB — HIV ANTIBODY (ROUTINE TESTING W REFLEX): HIV Screen 4th Generation wRfx: NONREACTIVE

## 2022-05-13 MED ORDER — NOREPINEPHRINE 4 MG/250ML-% IV SOLN
INTRAVENOUS | Status: AC | PRN
  Administered 2022-05-13: 10 ug/min via INTRAVENOUS

## 2022-05-13 MED ORDER — PANTOPRAZOLE 2 MG/ML SUSPENSION: 40.0000 mg | Freq: Every day | ORAL | Status: DC

## 2022-05-13 MED ORDER — "THROMBI-PAD 3""X3"" EX PADS"
1.0000 | MEDICATED_PAD | Freq: Once | CUTANEOUS | Status: AC
  Administered 2022-05-13: 1 via TOPICAL
  Filled 2022-05-13: qty 1

## 2022-05-13 MED ORDER — ETOMIDATE 2 MG/ML IV SOLN
INTRAVENOUS | Status: AC | PRN
  Administered 2022-05-13: 20 mg via INTRAVENOUS

## 2022-05-13 MED ORDER — NOREPINEPHRINE 4 MG/250ML-% IV SOLN
INTRAVENOUS | Status: AC
  Filled 2022-05-13: qty 250

## 2022-05-13 MED ORDER — NOREPINEPHRINE 4 MG/250ML-% IV SOLN
0.0000 ug/min | INTRAVENOUS | Status: DC
  Administered 2022-05-13: 4 ug/min via INTRAVENOUS
  Administered 2022-05-13: 16 ug/min via INTRAVENOUS
  Administered 2022-05-13: 20 ug/min via INTRAVENOUS
  Administered 2022-05-13: 17 ug/min via INTRAVENOUS
  Administered 2022-05-13: 2 ug/min via INTRAVENOUS
  Filled 2022-05-13 (×3): qty 250

## 2022-05-13 MED ORDER — MIDAZOLAM HCL 2 MG/2ML IJ SOLN
INTRAMUSCULAR | Status: AC
  Filled 2022-05-13: qty 2

## 2022-05-13 MED ORDER — HYDROMORPHONE HCL 1 MG/ML IJ SOLN
INTRAMUSCULAR | Status: AC
  Administered 2022-05-13: 0.5 mg
  Filled 2022-05-13: qty 1

## 2022-05-13 MED ORDER — FENTANYL 2500MCG IN NS 250ML (10MCG/ML) PREMIX INFUSION
0.0000 ug/h | INTRAVENOUS | Status: DC
  Administered 2022-05-13: 100 ug/h via INTRAVENOUS
  Administered 2022-05-14: 175 ug/h via INTRAVENOUS
  Administered 2022-05-14 – 2022-05-17 (×6): 200 ug/h via INTRAVENOUS
  Filled 2022-05-13 (×7): qty 250

## 2022-05-13 MED ORDER — FENTANYL CITRATE PF 50 MCG/ML IJ SOSY
PREFILLED_SYRINGE | INTRAMUSCULAR | Status: AC
  Filled 2022-05-13: qty 2

## 2022-05-13 MED ORDER — POLYETHYLENE GLYCOL 3350 17 G PO PACK: 17.0000 g | PACK | Freq: Every day | ORAL | Status: DC | PRN

## 2022-05-13 MED ORDER — CHLORHEXIDINE GLUCONATE CLOTH 2 % EX PADS
6.0000 | MEDICATED_PAD | Freq: Every day | CUTANEOUS | Status: DC
  Administered 2022-05-15 – 2022-05-21 (×10): 6 via TOPICAL

## 2022-05-13 MED ORDER — FENTANYL CITRATE PF 50 MCG/ML IJ SOSY
50.0000 ug | PREFILLED_SYRINGE | INTRAMUSCULAR | Status: DC | PRN
  Administered 2022-05-14: 50 ug via INTRAVENOUS

## 2022-05-13 MED ORDER — FENTANYL CITRATE PF 50 MCG/ML IJ SOSY
50.0000 ug | PREFILLED_SYRINGE | INTRAMUSCULAR | Status: AC | PRN
  Administered 2022-05-13 – 2022-05-14 (×3): 50 ug via INTRAVENOUS
  Filled 2022-05-13: qty 1

## 2022-05-13 MED ORDER — LACTATED RINGERS IV BOLUS
1000.0000 mL | Freq: Once | INTRAVENOUS | Status: AC
Start: 2022-05-13 — End: 2022-05-13
  Administered 2022-05-13: 1000 mL via INTRAVENOUS

## 2022-05-13 MED ORDER — MIDAZOLAM HCL 5 MG/5ML IJ SOLN
INTRAMUSCULAR | Status: AC | PRN
  Administered 2022-05-13: 4 mg via INTRAVENOUS

## 2022-05-13 MED ORDER — PANTOPRAZOLE SODIUM 40 MG IV SOLR
40.0000 mg | Freq: Every day | INTRAVENOUS | Status: DC
  Administered 2022-05-13 – 2022-05-16 (×4): 40 mg via INTRAVENOUS
  Filled 2022-05-13 (×4): qty 10

## 2022-05-13 MED ORDER — ACETAMINOPHEN 325 MG PO TABS
650.0000 mg | ORAL_TABLET | ORAL | Status: DC | PRN
  Administered 2022-05-13: 650 mg
  Filled 2022-05-13 (×2): qty 2

## 2022-05-13 MED ORDER — MIDAZOLAM-SODIUM CHLORIDE 100-0.9 MG/100ML-% IV SOLN
0.5000 mg/h | INTRAVENOUS | Status: DC
  Administered 2022-05-13: 4 mg/h via INTRAVENOUS
  Filled 2022-05-13: qty 100

## 2022-05-13 MED ORDER — VANCOMYCIN HCL 2000 MG/400ML IV SOLN
2000.0000 mg | Freq: Once | INTRAVENOUS | Status: AC
  Administered 2022-05-13: 2000 mg via INTRAVENOUS
  Filled 2022-05-13: qty 400

## 2022-05-13 MED ORDER — ORAL CARE MOUTH RINSE
15.0000 mL | OROMUCOSAL | Status: DC
  Administered 2022-05-14 – 2022-05-17 (×43): 15 mL via OROMUCOSAL

## 2022-05-13 MED ORDER — VANCOMYCIN HCL 1750 MG/350ML IV SOLN
1750.0000 mg | INTRAVENOUS | Status: DC
Start: 2022-05-14 — End: 2022-05-15
  Administered 2022-05-14: 1750 mg via INTRAVENOUS
  Filled 2022-05-13 (×2): qty 350

## 2022-05-13 MED ORDER — ROCURONIUM BROMIDE 50 MG/5ML IV SOLN
INTRAVENOUS | Status: AC | PRN
  Administered 2022-05-13: 100 mg via INTRAVENOUS

## 2022-05-13 MED ORDER — QUETIAPINE FUMARATE 50 MG PO TABS
50.0000 mg | ORAL_TABLET | Freq: Two times a day (BID) | ORAL | Status: DC
  Administered 2022-05-13 – 2022-05-18 (×10): 50 mg
  Filled 2022-05-13 (×10): qty 1

## 2022-05-13 MED ORDER — MIDAZOLAM HCL 2 MG/2ML IJ SOLN
1.0000 mg | INTRAMUSCULAR | Status: DC | PRN
  Administered 2022-05-14 – 2022-05-15 (×2): 2 mg via INTRAVENOUS
  Filled 2022-05-13 (×2): qty 2

## 2022-05-13 MED ORDER — FENTANYL CITRATE (PF) 100 MCG/2ML IJ SOLN
INTRAMUSCULAR | Status: DC | PRN
  Administered 2022-05-13: 100 ug via INTRAVENOUS

## 2022-05-13 MED ORDER — FENTANYL 2500MCG IN NS 250ML (10MCG/ML) PREMIX INFUSION
0.0000 ug/h | INTRAVENOUS | Status: DC
  Filled 2022-05-13: qty 250

## 2022-05-13 MED ORDER — DOCUSATE SODIUM 50 MG/5ML PO LIQD
100.0000 mg | Freq: Two times a day (BID) | ORAL | Status: DC
  Administered 2022-05-14 – 2022-05-17 (×6): 100 mg
  Filled 2022-05-13 (×6): qty 10

## 2022-05-13 MED ORDER — DEXMEDETOMIDINE HCL IN NACL 400 MCG/100ML IV SOLN
0.0000 ug/kg/h | INTRAVENOUS | Status: DC
  Administered 2022-05-13: 0.6 ug/kg/h via INTRAVENOUS
  Administered 2022-05-13: 0.4 ug/kg/h via INTRAVENOUS
  Administered 2022-05-13: 1.2 ug/kg/h via INTRAVENOUS
  Administered 2022-05-14 (×2): 0.6 ug/kg/h via INTRAVENOUS
  Filled 2022-05-13 (×6): qty 100

## 2022-05-13 MED ORDER — ORAL CARE MOUTH RINSE: 15.0000 mL | OROMUCOSAL | Status: DC | PRN

## 2022-05-13 MED ORDER — ROCURONIUM BROMIDE 10 MG/ML (PF) SYRINGE
PREFILLED_SYRINGE | INTRAVENOUS | Status: AC
  Filled 2022-05-13: qty 10

## 2022-05-13 MED ORDER — DOCUSATE SODIUM 50 MG/5ML PO LIQD: 100.0000 mg | Freq: Two times a day (BID) | ORAL | Status: DC | PRN

## 2022-05-13 MED ORDER — ONDANSETRON HCL 4 MG/2ML IJ SOLN: 4.0000 mg | Freq: Four times a day (QID) | INTRAMUSCULAR | Status: DC | PRN

## 2022-05-13 MED ORDER — NOREPINEPHRINE 16 MG/250ML-% IV SOLN
0.0000 ug/min | INTRAVENOUS | Status: DC
  Administered 2022-05-14: 10 ug/min via INTRAVENOUS
  Administered 2022-05-14: 20 ug/min via INTRAVENOUS
  Filled 2022-05-13 (×2): qty 250

## 2022-05-13 MED ORDER — ETOMIDATE 2 MG/ML IV SOLN
INTRAVENOUS | Status: AC
  Filled 2022-05-13: qty 20

## 2022-05-13 MED ORDER — SODIUM CHLORIDE 0.9 % IV SOLN
2.0000 g | Freq: Three times a day (TID) | INTRAVENOUS | Status: DC
  Administered 2022-05-13 – 2022-05-18 (×17): 2 g via INTRAVENOUS
  Filled 2022-05-13 (×18): qty 12.5

## 2022-05-13 MED ORDER — POLYETHYLENE GLYCOL 3350 17 G PO PACK
17.0000 g | PACK | Freq: Every day | ORAL | Status: DC
Start: 2022-05-13 — End: 2022-05-17
  Administered 2022-05-16 – 2022-05-17 (×2): 17 g
  Filled 2022-05-13: qty 1

## 2022-05-13 MED ORDER — QUETIAPINE FUMARATE 25 MG PO TABS: 50.0000 mg | ORAL_TABLET | Freq: Every day | ORAL | Status: DC

## 2022-05-13 NOTE — Plan of Care (Signed)

## 2022-05-13 NOTE — ED Notes (Signed)
Notified Diplomatic Services operational officer to page CCM d/t pt hypotensive. Awaiting call back.

## 2022-05-13 NOTE — ED Notes (Signed)
Tessie Fass NP at bedside

## 2022-05-13 NOTE — H&P (Addendum)
NAME:  Spencer Humphrey, MRN:  [id number], DOB:  [birth year], LOS: 0 ADMISSION DATE:  05/11/2022, CONSULTATION DATE:  05/09/2022 REFERRING MD:  Donnald Garre - EM, CHIEF COMPLAINT:  tracheostomy complication.    History of Present Illness:  56 yo CM PMH etoh abuse, etoh withdrawal seizure, s/p trachostomy, MSSA VAP strep bacteremia who presented to [facility] ED 7/23 via  EMS from [facility] for bleeding trach and associated hypotension.  At [facility] following hospitalization at [facility] for EtOH withdrawal sz, when he bit his tongue and underwent tracheostomy placement 7/18.  In ED, required pressors for SBP 60s and transfusion via [place]. R fem CVC placed. ENT consulted. Endotracheally intubated and Trach removed, with no further active bleeding noted.   PCCM consulted for admission in this settng  Pertinent  Medical History  Etoh abuse Alcohol withdrawal seizure   Significant Hospital Events: Including procedures, antibiotic start and stop dates in addition to other pertinent events   7/23 ED from [facility] with trach bleeding   Interim History / Subjective:  ENT consulted and ETT placed, trach removed. No active bleed on ENT inspection   Objective   Blood pressure 115/74, pulse (!) 130, temperature (!) 100.9 F (38.3 C), temperature source Temporal, resp. rate (!) 24, SpO2 100 %.    Vent Mode: PRVC FiO2 (%):  [50 %] 50 % Set Rate:  [12 bmp] 12 bmp Vt Set:  [550 mL] 550 mL PEEP:  [5 cmH20] 5 cmH20 Plateau Pressure:  [16 cmH20] 16 cmH20   Intake/Output Summary (Last 24 hours) at 04/24/2022 0853 Last data filed at 05/10/2022 0813 Gross per 24 hour  Intake 9.91 ml  Output --  Net 9.91 ml   There were no vitals filed for this visit.  Examination: General: Ill appearing middle aged M  HENT: NCAT. Trach with blood soaked packing and clot formation  Lungs: Symmetrical chest expansion on MV. Coarse sounds  Cardiovascular: tachycardic s1s2 cap refill 3 sec  Abdomen: soft round  Extremities:  no acute deformity  Neuro: Upward roving eyes. Moving spontaneously. Grimacing to pain, not following commands  GU: defer   Resolved Hospital Problem list     Assessment & Plan:   Acute respiratory failure with hypoxia  VDRF, s/p tracheostomy -- in setting of tongue lac from etoh withdrawal seizure Tracheostomy complication with new bleeding  Risk for chemical pneumonitis in setting of tracheal bleeding  Hx MSSA PNA Hx Enterococcus faecalis sinusitis  -trach placed at [facility] 7/18. At [facility] on rounds 7/23 noted to have extensive bleeding  P -ENT consulted -- Endotracheally intubated and trach removed  -CXR, aBG -wean MV as able -monitor for bleeding. Considerations for definitive airway pending  -ancef   Shock -- think driving state is septic shock.  -? Component of hypovolemic with blood loss but doesn't sound like ongoing bleed was seen by ENT  Strep Bacteremia  -known MSSA PNA and strep bacteremia. Sounds like also some sinusitis and persistent fevers at [facility]. Arrives here with T 100.9 -new onset bleeding from trach  P -follow admitting labs -- if concerning for sepsis will plan to broaden from ancef (which pt is to receive through 7/28 for bacteremia) -Bcx, UA, trach aspirate  -pressors, product via CVC -ID consult   Acute encephalopathy -- shock, toxic metabolic  EtOH abuse disorder Recent alcohol withdrawal c/b seizures -1 sz on 7/8. When withdrawal window passed, AEDs weaned and reportedly no further sz activity  -CT head 7/11 No acute findings, MRI brain 7/15 no acute  findings, LP obtained and was non-infectious. Started lactulose for hyperammonemia -at time of discharge, noted to have " improving" mental status but not at baseline.  P -supportive care for MAP > 65  -check an ammonia level     Best Practice (right click and "Reselect all SmartList Selections" daily)   Diet/type: NPO DVT prophylaxis: SCD GI prophylaxis: PPI Lines: Central line Foley:   N/A Code Status:  full code Last date of multidisciplinary goals of care discussion [--]  Labs   CBC: Recent Labs  Lab 05/07/2022 0753 05/12/2022 0754 05/05/2022 0800  WBC  --   --  19.0*  HGB 10.2* 10.2* 9.8*  HCT 30.0* 30.0* 31.3*  MCV  --   --  110.6*  PLT  --   --  235    Basic Metabolic Panel: Recent Labs  Lab 05/14/2022 0753 05/02/2022 0754 04/28/2022 0800  NA 144 143 143  K 5.3* 5.3* 4.7  CL  --  109 110  CO2  --   --  24  GLUCOSE  --  155* 180*  BUN  --  24* 19  CREATININE  --  1.00 1.18  CALCIUM  --   --  8.5*   GFR: CrCl cannot be calculated (Unknown ideal weight.). Recent Labs  Lab 04/29/2022 0800  WBC 19.0*    Liver Function Tests: Recent Labs  Lab 05/20/2022 0800  AST 113*  ALT 39  ALKPHOS 160*  BILITOT 1.3*  PROT 6.6  ALBUMIN 2.1*   No results for input(s): "LIPASE", "AMYLASE" in the last 168 hours. No results for input(s): "AMMONIA" in the last 168 hours.  ABG    Component Value Date/Time   HCO3 25.9 05/08/2022 0753   TCO2 25 05/17/2022 0754   O2SAT 40 05/03/2022 0753     Coagulation Profile: Recent Labs  Lab 04/25/2022 0800  INR 1.6*    Cardiac Enzymes: No results for input(s): "CKTOTAL", "CKMB", "CKMBINDEX", "TROPONINI" in the last 168 hours.  HbA1C: Hgb A1c MFr Bld  Date/Time Value Ref Range Status  05/29/2018 11:08 AM 5.6 <5.7 % of total Hgb Final    Comment:    For the purpose of screening for the presence of diabetes: . <5.7%       Consistent with the absence of diabetes 5.7-6.4%    Consistent with increased risk for diabetes             (prediabetes) > or =6.5%  Consistent with diabetes . This assay result is consistent with a decreased risk of diabetes. . Currently, no consensus exists regarding use of hemoglobin A1c for diagnosis of diabetes in children. . According to American Diabetes Association (ADA) guidelines, hemoglobin A1c <7.0% represents optimal control in non-pregnant diabetic patients. Different metrics  may apply to specific patient populations.  Standards of Medical Care in Diabetes(ADA). Marland Kitchen   01/29/2018 10:19 AM 5.8 (H) <5.7 % of total Hgb Final    Comment:    For someone without known diabetes, a hemoglobin  A1c value between 5.7% and 6.4% is consistent with prediabetes and should be confirmed with a  follow-up test. . For someone with known diabetes, a value <7% indicates that their diabetes is well controlled. A1c targets should be individualized based on duration of diabetes, age, comorbid conditions, and other considerations. . This assay result is consistent with an increased risk of diabetes. . Currently, no consensus exists regarding use of hemoglobin A1c for diagnosis of diabetes for children. .     CBG: No results  for input(s): "GLUCAP" in the last 168 hours.  Review of Systems:   Unable to obtain due to patient condition   Past Medical History:  He,  has a past medical history of DJD (degenerative joint disease) of knee, Hypertension, Kidney stones, Plantar fasciitis, and Prediabetes.   Surgical History:   Past Surgical History:  Procedure Laterality Date   KNEE ARTHROSCOPY Right 1987     Social History:   reports that he quit smoking about 19 years ago. He has never used smokeless tobacco. He reports that he does not drink alcohol and does not use drugs.   Family History:  His family history is not on file.   Allergies Allergies  Allergen Reactions   Fenofibrate Nausea Only   Penicillins Other (See Comments)    Unknown     Home Medications  Prior to Admission medications   Medication Sig Start Date End Date Taking? Authorizing Provider  bisoprolol-hydrochlorothiazide [facility]) 5-6.25 MG tablet TAKE 1 TABLET EVERY MORNING FOR BLOOD PRESSURE 04/21/18   Lucky Cowboy, MD  Cholecalciferol (VITAMIN D PO) Take 8,000 Int'l Units by mouth daily.    [provider]  doxycycline (VIBRAMYCIN) 100 MG capsule Take 1 capsule twice daily with food  05/29/18   Judd Gaudier, NP  lisinopril (PRINIVIL,ZESTRIL) 40 MG tablet Take 1 tablet (40 mg total) by mouth daily. 05/29/18   Judd Gaudier, NP  rosuvastatin (CRESTOR) 40 MG tablet Take 1 tablet Daily for Cholesterol 08/31/19   Lucky Cowboy, MD     Critical care time: 52 minutes       CRITICAL CARE Performed by: Lanier Clam   Total critical care time: 52 minutes  Critical care time was exclusive of separately billable procedures and treating other patients. Critical care was necessary to treat or prevent imminent or life-threatening deterioration.  Critical care was time spent personally by me on the following activities: development of treatment plan with patient and/or surrogate as well as nursing, discussions with consultants, evaluation of patient's response to treatment, examination of patient, obtaining history from patient or surrogate, ordering and performing treatments and interventions, ordering and review of laboratory studies, ordering and review of radiographic studies, pulse oximetry and re-evaluation of patient's condition.  Tessie Fass MSN, AGACNP-BC [place] [facility] Amion for pager  25-May-2022, 10:11 AM

## 2022-05-13 NOTE — ED Provider Notes (Signed)
[facility] EMERGENCY DEPARTMENT Provider Note   CSN: [id number] Arrival date & time: 05/06/2022  0725     History {Add pertinent medical, surgical, social history, OB history to HPI:1} Chief Complaint  Patient presents with  . Trach Bleeding    Spencer Humphrey is a 56 y.o. male.  HPI Patient is transferred from [facility] rehab facility.  Reportedly the team was rounding this morning and found the patient to have extensive bleeding around his tracheostomy site.  Tracheostomy is recent, placed [phone].  Patient had prolonged ICU stay after alcohol withdrawal seizure.  Patient had severely bitten his tongue causing swelling requiring intubation.  Patient did not adequately return to normal for extubation.  Patient's wife and daughter reports that diagnostic testing did not indicate stroke or other evident neurologic etiology although there had been discussion of possible anoxic brain injury.  Thus, tracheostomy placed and patient transition to [facility]. 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 Authorizing Provider  bisoprolol-hydrochlorothiazide [facility]) 5-6.25 MG tablet TAKE 1 TABLET EVERY MORNING FOR BLOOD PRESSURE 04/21/18   Lucky Cowboy, MD  Cholecalciferol (VITAMIN D PO) Take 8,000 Int'l Units by mouth daily.    [provider]  doxycycline (VIBRAMYCIN) 100 MG capsule Take 1 capsule twice daily with food 05/29/18   Judd Gaudier, NP  lisinopril (PRINIVIL,ZESTRIL) 40 MG tablet Take 1 tablet (40 mg total) by mouth daily. 05/29/18   Judd Gaudier, NP  rosuvastatin (CRESTOR) 40 MG tablet Take 1 tablet Daily  for Cholesterol 08/31/19   Lucky Cowboy, MD      Allergies    Fenofibrate and Penicillins    Review of Systems   Review of Systems Level 5 caveat cannot obtain review of systems due to patient condition Physical Exam Updated Vital Signs BP (!) 80/58   Pulse 94   Temp (!) 100.9 F (38.3 C) (Temporal)   Resp (!) 21   Ht 6' (1.829 m)   Wt 104.3 kg   SpO2 98%   BMI 31.19 kg/m  Physical Exam Constitutional:      Comments: Patient arrives via EMS.  Patient has tracheostomy and is ventilator assisted.  Patient is responsive.  He has spontaneous eye opening.  He responds to painful stimuli.  HENT:     Nose:     Comments: Feeding tube in the nose.    Mouth/Throat:     Comments: Some dried blood in the mouth.  Also clot and mucus tinged blood in the posterior airway.  No active bleeding or welling of blood from the airway. Eyes:     Comments: Patient has eye-opening.  He will gaze in my direction with voice.  Frequently eyes rolled back in his head.  Neck:     Comments: Tracheostomy device at the sternal notch.  Large fresh clot surrounding the tracheostomy.  Bloodsoaked ABD pads and towels around the neck. Cardiovascular:     Comments: Tachycardic regular distant heart sound Pulmonary:     Comments: Patient is on the ventilator.  Chest rise and fall positive, symmetric airflow without significant gross rhonchi or gurgling sound. Abdominal:     Comments: Abdomen soft nondistended.  Musculoskeletal:     Comments: Mild extremity edema.  IO placed  by EMS in the left tibia  Skin:    Comments: Skin pale and slightly cool  Neurological:     Comments: Patient has spontaneous eye opening.  Calling his name he will gaze in my direction.  He does seem to nod appropriately to simple questions but does seem confused or sedated.  He does occasionally try to pick up his hands.    ED Results / Procedures / Treatments   Labs (all labs ordered are listed, but only abnormal results are  displayed) Labs Reviewed  COMPREHENSIVE METABOLIC PANEL - Abnormal; Notable for the following components:      Result Value   Glucose, Bld 180 (*)    Calcium 8.5 (*)    Albumin 2.1 (*)    AST 113 (*)    Alkaline Phosphatase 160 (*)    Total Bilirubin 1.3 (*)    All other components within normal limits  CBC - Abnormal; Notable for the following components:   WBC 19.0 (*)    RBC 2.83 (*)    Hemoglobin 9.8 (*)    HCT 31.3 (*)    MCV 110.6 (*)    MCH 34.6 (*)    RDW 18.4 (*)    nRBC 0.5 (*)    All other components within normal limits  PROTIME-INR - Abnormal; Notable for the following components:   Prothrombin Time 18.6 (*)    INR 1.6 (*)    All other components within normal limits  URINALYSIS, ROUTINE W REFLEX MICROSCOPIC - Abnormal; Notable for the following components:   Color, Urine AMBER (*)    APPearance HAZY (*)    Hgb urine dipstick LARGE (*)    Protein, ur 100 (*)    RBC / HPF >50 (*)    Bacteria, UA RARE (*)    All other components within normal limits  I-STAT CHEM 8, ED - Abnormal; Notable for the following components:   Potassium 5.3 (*)    BUN 24 (*)    Glucose, Bld 155 (*)    Calcium, Ion 1.10 (*)    Hemoglobin 10.2 (*)    HCT 30.0 (*)    All other components within normal limits  I-STAT VENOUS BLOOD GAS, ED - Abnormal; Notable for the following components:   pO2, Ven 24 (*)    Potassium 5.3 (*)    Calcium, Ion 1.12 (*)    HCT 30.0 (*)    Hemoglobin 10.2 (*)    All other components within normal limits  CULTURE, BLOOD (ROUTINE X 2)  CULTURE, BLOOD (ROUTINE X 2)  URINE CULTURE  RESP PANEL BY RT-PCR (FLU A&B, COVID) ARPGX2  CULTURE, RESPIRATORY W GRAM STAIN  HIV ANTIBODY (ROUTINE TESTING W REFLEX)  TYPE AND SCREEN  ABO/RH  BLOOD PRODUCT ORDER (VERBAL) VERIFICATION    EKG EKG Interpretation  Date/Time:  Sunday May 13 2022 10:01:11 EDT Ventricular Rate:  125 PR Interval:  113 QRS Duration: 84 QT Interval:  304 QTC Calculation: 439 R  Axis:   58 Text Interpretation: Sinus tachycardia tachycardia, otherwise no sig change from previous Confirmed by Arby Barrette [phone]) on 05/11/2022 10:38:51 AM  Radiology DG Chest Port 1 View  Result Date: 04/25/2022 CLINICAL DATA:  Trauma. Complains of bleeding around tracheostomy site. EXAM: PORTABLE CHEST 1 VIEW COMPARISON:  02/13/14 FINDINGS: There is a endotracheal tube with tip 5.4 cm above the carina. A feeding tube is in place with tip in the stomach. Stable cardiomediastinal contours. Mild left lung base and right perihilar streaky opacities are  favored to represent areas of atelectasis. Airspace disease or aspiration not excluded. IMPRESSION: 1. Endotracheal tube in place with tip 5.4 cm above the carina. 2. Right perihilar and left lung base opacities are favored to represent areas of atelectasis. Airspace disease or aspiration not excluded. Electronically Signed   By: Signa Kell M.D.   On: 05/11/2022 09:09    Procedures .Central Line  Date/Time: 05/07/2022 10:39 AM  Performed by: Arby Barrette, MD Authorized by: Arby Barrette, MD   Consent:    Consent obtained:  Emergent situation Pre-procedure details:    Indication(s): insufficient peripheral access     Hand hygiene: Hand hygiene performed prior to insertion     Sterile barrier technique: All elements of maximal sterile technique followed     Skin preparation:  Chlorhexidine   Skin preparation agent: Skin preparation agent completely dried prior to procedure   Anesthesia:    Anesthesia method:  None Procedure details:    Location:  R femoral   Site selection rationale:  Emergent for blood loss hypotension, unstable. Neck severely bleeding tracheostomy with swelling and blood.   Patient position:  Supine   Procedural supplies:  Triple lumen   Landmarks identified: yes     Ultrasound guidance: yes     Ultrasound guidance timing: real time     Sterile ultrasound techniques: Sterile gel and sterile probe covers  were used     Number of attempts:  2   Successful placement: yes   Post-procedure details:    Post-procedure:  Dressing applied and line sutured   Assessment:  Blood return through all ports, free fluid flow and no pneumothorax on x-ray   Procedure completion:  Tolerated well, no immediate complications Procedure Name: Intubation Date/Time: 04/24/2022 10:43 AM  Performed by: Arby Barrette, MDPre-anesthesia Checklist: Patient identified, Patient being monitored, Emergency Drugs available and Suction available Oxygen Delivery Method: Ambu bag Preoxygenation: Pre-oxygenation with 100% oxygen Induction Type: IV induction Laryngoscope Size: Glidescope and 3 Grade View: Grade I Tube size: 7.5 mm Number of attempts: 1 Placement Confirmation: ETT inserted through vocal cords under direct vision, Positive ETCO2 and Breath sounds checked- equal and bilateral Tube secured with: ETT holder Comments: Patient had tracheostomy in place on the ventilator.  Induction medications administered.  Patient had no response to painful stimulus.  Patient was given Versed, etomidate and rocuronium.  Patient had blood in the airway that required suction.  There was clot but no active or welling blood.  Cords directly visualized and tube passed without difficulty.  At that time Dr. Zoila Shutter removed the tracheostomy and ET tube advanced.  Patient was easy to bag without resistance.  There was no active bleeding from the tracheostomy or air escape.  Patient tolerated well and was stable throughout procedure.    CRITICAL CARE Performed by: Arby Barrette   Total critical care time: 60 minutes  Critical care time was exclusive of separately billable procedures and treating other patients.  Critical care was necessary to treat or prevent imminent or life-threatening deterioration.  Critical care was time spent personally by me on the following activities: development of treatment plan with patient and/or surrogate  as well as nursing, discussions with consultants, evaluation of patient's response to treatment, examination of patient, obtaining history from patient or surrogate, ordering and performing treatments and interventions, ordering and review of laboratory studies, ordering and review of radiographic studies, pulse oximetry and re-evaluation of patient's condition.  {Document cardiac monitor, telemetry assessment procedure when appropriate:1}  Medications Ordered in ED  Medications  norepinephrine (LEVOPHED) 4mg  in (0.016 mg/mL) premix infusion (14 mcg/min Intravenous Infusion Verify 25-May-2022 1054)  fentaNYL (SUBLIMAZE) 50 MCG/ML injection (  Not Given 05/25/2022 0851)  midazolam (VERSED) 2 MG/2ML injection (  Not Given 05/25/2022 0851)  pantoprazole (PROTONIX) injection 40 mg (has no administration in time range)  acetaminophen (TYLENOL) tablet 650 mg (has no administration in time range)  docusate (COLACE) 50 MG/5ML liquid 100 mg (has no administration in time range)  polyethylene glycol (MIRALAX / GLYCOLAX) packet 17 g (has no administration in time range)  ondansetron (ZOFRAN) injection 4 mg (has no administration in time range)  docusate (COLACE) 50 MG/5ML liquid 100 mg (100 mg Per Tube Not Given May 25, 2022 0928)  polyethylene glycol (MIRALAX / GLYCOLAX) packet 17 g (17 g Per Tube Not Given May 25, 2022 0927)  fentaNYL (SUBLIMAZE) injection 50 mcg (50 mcg Intravenous Given 05-25-22 1018)  fentaNYL (SUBLIMAZE) injection 50-200 mcg (has no administration in time range)  dexmedetomidine (PRECEDEX) 400 MCG/100ML (4 mcg/mL) infusion (0.9 mcg/kg/hr  104.3 kg Intravenous Infusion Verify 25-May-2022 1029)  midazolam (VERSED) injection 1-2 mg (has no administration in time range)  QUEtiapine (SEROQUEL) tablet 50 mg (has no administration in time range)  ceFEPIme (MAXIPIME) 2 g in sodium chloride 0.9 % 100 mL IVPB (0 g Intravenous Stopped 2022/05/25 1110)  vancomycin (VANCOREADY) IVPB 2000 mg/400 mL (2,000 mg  Intravenous New Bag/Given 2022-05-25 1116)  vancomycin (VANCOREADY) IVPB 1750 mg/350 mL (has no administration in time range)  HYDROmorphone (DILAUDID) 1 MG/ML injection (0.5 mg  Given 05/25/2022 0734)  norepinephrine (LEVOPHED) 4mg  in 05/15/22 (0.016 mg/mL) premix infusion (0 mcg/kg/min Intravenous Stopped 25-May-2022 0805)  midazolam (VERSED) 5 MG/5ML injection (4 mg Intravenous Given May 25, 2022 0833)  etomidate (AMIDATE) injection (20 mg Intravenous Given 2022/05/25 0834)  rocuronium (ZEMURON) injection (100 mg Intravenous Given 05/25/2022 05/15/22)    ED Course/ Medical Decision Making/ A&P                           Medical Decision Making Amount and/or Complexity of Data Reviewed Labs: ordered. Radiology: ordered.  Risk Prescription drug management. Decision regarding hospitalization.   Patient presents as outlined.  He required immediate resuscitation.  Patient had hypotension into the 80s and tachycardia.  With history of aggressively bleeding tracheostomy and evidence of significant clot and blood loss, emergent blood immediately ordered.  Patient had 1 peripheral IV site functioning.  At that time central line also placed.  After stabilization with 2 units of blood and fluid resuscitation as well as temporary use of norepinephrine, patient's vital signs stabilized with normotension and improved heart rate into the low 100s.  Dr. 05/15/22, ENT, was at bedside for tracheostomy removal.  At that time there was no active bleeding and the wound was packed and dressed by Dr. [id number].  Consult: Critical care, Pollyann Kennedy from critical care has evaluated the patient and assisted with central line placement at bedside.        {Document critical care time when appropriate:1} {Document review of labs and clinical decision tools ie heart score, Chads2Vasc2 etc:1}  {Document your independent review of radiology images, and any outside records:1} {Document your discussion with family members, caretakers, and with  consultants:1} {Document social determinants of health affecting pt's care:1} {Document your decision making why or why not admission, treatments were needed:1} Final Clinical Impression(s) / ED Diagnoses Final diagnoses:  Tracheostomy hemorrhage (HCC)  Severe comorbid illness    Rx / DC Orders ED Discharge Orders  None       

## 2022-05-13 NOTE — ED Notes (Signed)
Trauma RN called as a Theatre stage manager

## 2022-05-13 NOTE — ED Notes (Signed)
Received call back from [facility] NP. Norepi to be restarted.

## 2022-05-13 NOTE — Progress Notes (Signed)
Patient arrived by [facility], from [facility], due to bleeding trach.  Upon arrival, patient had ice, and packing, around trach for clotting.  Patient was placed on our ventilator with settings provided by [facility].  Per Md, packing removed from around trach with no noted active bleeding.  Patient only noted to have large clots around trach. At this time, patient is tolerating ventilator well.  Will continue to monitor.

## 2022-05-13 NOTE — ED Notes (Signed)
Pollyann Kennedy MD (ENT) at bedside

## 2022-05-13 NOTE — TOC Initial Note (Signed)
Transition of Care [facility]) - Initial/Assessment Note    Patient Details  Name: Spencer Humphrey MRN: [id number] Date of Birth: 05-Jul-1966  [facility] of [facility]) CM/SW Contact:    Lockie Pares, RN Phone Number: 31-May-2022, 9:27 AM  Clinical Narrative:                  Patient from [facility] trach bleeding, bleeding stopped on vent good aeration, will be admitted to ICU. Plan to return to LTAC once stable TOC will follow for needs, recommendations, and transitions of care Expected Discharge Plan: Long Term Acute Care (LTAC) Barriers to Discharge: Continued Medical Work up   Patient Goals and CMS Choice        Expected Discharge Plan and Services Expected Discharge Plan: Long Term Acute Care (LTAC)       Living arrangements for the past 2 months: Post-Acute Facility                                      Prior Living Arrangements/Services Living arrangements for the past 2 months: Post-Acute Facility   Patient language and need for interpreter reviewed:: Yes        Need for Family Participation in Patient Care: Yes (Comment) Care giver support system in place?: Yes (comment)   Criminal Activity/Legal Involvement Pertinent to Current Situation/Hospitalization: No - Comment as needed  Activities of Daily Living      Permission Sought/Granted                  Emotional Assessment   Attitude/Demeanor/Rapport: Intubated (Following Commands or Not Following Commands)     Alcohol / Substance Use: Not Applicable Psych Involvement: No (comment)  Admission diagnosis:  Tracheostomy complication (HCC) [J95.00] Patient Active Problem List   Diagnosis Date Noted   Tracheostomy complication (HCC) 05/31/2022   Anemia 05/30/2018   Obesity (BMI 30.0-34.9) 02/11/2015   Medication management 11/11/2014   Seizure (HCC) 02/12/2014   Hyperlipidemia 09/23/2013   Vitamin D deficiency 09/21/2013   Hypertension    Kidney stones    Plantar fasciitis     Prediabetes    DJD (degenerative joint disease) of knee    PCP:  Lucky Cowboy, MD Pharmacy:   [id number] - , [place] - [address] [place] [address] Phone: [phone] Fax: [phone]     Social Determinants of Health (SDOH) Interventions    Readmission Risk Interventions     No data to display

## 2022-05-13 NOTE — ED Triage Notes (Signed)
Pt arrived to ED via [facility] from [facility] w/ c/o trach hemorrhage. Opening noted by critical care around trach from trachea to sternal notch. IO L tib via critical care. Pt is full code. Pt recently arrived to [facility]. Per report ETOH abuse which led to DT, bit tongue. Has a flexi, small NG tube for feedings, no meds, and foley.

## 2022-05-13 NOTE — ED Notes (Signed)
Gauze placed over trach opening.

## 2022-05-13 NOTE — ED Notes (Signed)
Pt covid positive on 7/19

## 2022-05-13 NOTE — ED Notes (Signed)
Central line kit at bedside. 1 unit emergent release blood being obtained

## 2022-05-13 NOTE — Progress Notes (Signed)
Pharmacy Antibiotic Note  Spencer Humphrey is a 56 y.o. male admitted on 2022/06/05 presenting from [facility] with hypotension, concern for sepsis.  Pharmacy has been consulted for vancomycin and cefepime dosing.  Plan: Vancomycin 2000 mg IV x 1, then 1750 mg IV q 24h (eAUC 492, SCr 1.18) Cefepime 2g IV every 8 hours Monitor renal function, Cx and clinical progression to narrow Vancomycin levels as needed  Height: 6' (182.9 cm) Weight: 104.3 kg (230 lb) IBW/kg (Calculated) : 77.6  Temp (24hrs), Avg:100.9 F (38.3 C), Min:100.9 F (38.3 C), Max:100.9 F (38.3 C)  Recent Labs  Lab 06-05-22 0754 06/05/22 0800  WBC  --  19.0*  CREATININE 1.00 1.18    Estimated Creatinine Clearance: 87.3 mL/min (by C-G formula based on SCr of 1.18 mg/dL).    Allergies  Allergen Reactions   Fenofibrate Nausea Only   Penicillins Other (See Comments)    Unknown    Daylene Posey, PharmD Clinical Pharmacist ED Pharmacist Phone # [phone] 06/05/22 10:33 AM

## 2022-05-13 NOTE — Consult Note (Signed)
Reason for Consult: Bleeding tracheostomy Referring Physician: Arby Barrette, MD  Spencer Humphrey is an 56 y.o. male.  HPI: Patient underwent tracheostomy for airway protection at [facility] about a week ago.  He was transferred to [facility] and found to have severe bleeding from the tracheostomy earlier this morning.  He was then transferred to the [facility] emergency department.  He has a history of alcohol abuse and alcoholic seizures.  No other history can be obtained from the patient.  Past Medical History:  Diagnosis Date   DJD (degenerative joint disease) of knee    Right   Hypertension    Kidney stones    Plantar fasciitis    Prediabetes     Past Surgical History:  Procedure Laterality Date   KNEE ARTHROSCOPY Right 1987    No family history on file.  Social History:  reports that he quit smoking about 19 years ago. He has never used smokeless tobacco. He reports that he does not drink alcohol and does not use drugs.  Allergies:  Allergies  Allergen Reactions   Fenofibrate Nausea Only   Penicillins Other (See Comments)    Unknown    Medications: Reviewed  Results for orders placed or performed during the hospital encounter of 05/19/2022 (from the past 48 hour(s))  Type and screen [facility]     Status: None (Preliminary result)   Collection Time: 05/12/2022  7:50 AM  Result Value Ref Range   ABO/RH(D) PENDING    Antibody Screen PENDING    Sample Expiration 05/16/2022,2359    Unit Number [id number]    Blood Component Type RED CELLS,LR    Unit division 00    Status of Unit ISSUED    Unit tag comment VERBAL ORDERS PER DR DR.PFEIFFER    Transfusion Status PENDING    Crossmatch Result PENDING    Unit Number [id number]    Blood Component Type RED CELLS,LR    Unit division 00    Status of Unit ISSUED    Unit tag comment      VERBAL ORDERS PER DR DR.PFEIFFER Performed at [facility], [address] [place], [place] [address]    Transfusion  Status PENDING    Crossmatch Result PENDING   I-Stat venous blood gas, [facility] ED only)     Status: Abnormal   Collection Time: 04/21/2022  7:53 AM  Result Value Ref Range   pH, Ven 7.365 7.25 - 7.43   pCO2, Ven 45.4 44 - 60 mmHg   pO2, Ven 24 (LL) 32 - 45 mmHg   Bicarbonate 25.9 20.0 - 28.0 mmol/L   TCO2 27 22 - 32 mmol/L   O2 Saturation 40 %   Acid-Base Excess 0.0 0.0 - 2.0 mmol/L   Sodium 144 135 - 145 mmol/L   Potassium 5.3 (H) 3.5 - 5.1 mmol/L   Calcium, Ion 1.12 (L) 1.15 - 1.40 mmol/L   HCT 30.0 (L) 39.0 - 52.0 %   Hemoglobin 10.2 (L) 13.0 - 17.0 g/dL   Sample type VENOUS    Comment NOTIFIED PHYSICIAN   I-Stat Chem 8, ED     Status: Abnormal   Collection Time: 04/27/2022  7:54 AM  Result Value Ref Range   Sodium 143 135 - 145 mmol/L   Potassium 5.3 (H) 3.5 - 5.1 mmol/L   Chloride 109 98 - 111 mmol/L   BUN 24 (H) 6 - 20 mg/dL   Creatinine, Ser 8.84 0.61 - 1.24 mg/dL   Glucose, Bld 166 (  H) 70 - 99 mg/dL    Comment: Glucose reference range applies only to samples taken after fasting for at least 8 hours.   Calcium, Ion 1.10 (L) 1.15 - 1.40 mmol/L   TCO2 25 22 - 32 mmol/L   Hemoglobin 10.2 (L) 13.0 - 17.0 g/dL   HCT 62.9 (L) 47.6 - 54.6 %    No results found.  TKP:TWSFKCLE except as listed in admit H&P  Blood pressure 140/84, pulse (!) 137, resp. rate 12, SpO2 100 %.  PHYSICAL EXAM: Overall appearance: Follow simple commands.  On a ventilator via tracheostomy. Head:  Normocephalic, atraumatic. Ears: External ears look healthy. Nose: External nose is healthy in appearance. Internal nasal exam free of any lesions or obstruction. Oral Cavity/Pharynx:  There are no mucosal lesions or masses identified. Larynx/Hypopharynx: Deferred Neuro: Deferred. Neck: Tracheostomy in place with packing around the tracheostomy site soaked in blood but no active hemorrhage at the moment.  Studies Reviewed: none  Procedures: none   Assessment/Plan: Bleeding tracheostomy.  Airway stable.   Recommend we orally intubate.  This will be done by the emergency department staff.  Once there is an oral endotracheal tube in place I can remove the tracheostomy and try to assess the source of the bleeding.  J95.01    Medical Decision Making: #/Complex Problems: 4  Data Reviewed:3  Management:4 (1-Straightforward, 2-Low, 3-Moderate, 4-High)   Serena Colonel 05/08/2022, 8:25 AM   Patient was orally intubated by the ED staff without difficulty.  The tracheostomy tube was removed.  The tracheostomy wound was inspected and there is no active bleeding.  4 x 4 gauze was packed into the wound and a dressing was applied.  Recommend admission to the intensive care unit for observation over the next 24-48 hours to see if there is any further bleeding.  We can consider replacing the tracheostomy if there is no further bleeding.

## 2022-05-13 NOTE — Progress Notes (Signed)
Patient transported from ED to room 3M09 without complications.   

## 2022-05-13 NOTE — ED Notes (Signed)
Attempted to call report x 1. Per 103M they will approve the bed and the assigned RN will look at the pt chart and call this RN back. Notified ED CN/Director

## 2022-05-13 NOTE — ED Notes (Signed)
Family in consultation room.

## 2022-05-13 NOTE — ED Notes (Signed)
Selinda Eon RN made aware of pt temp.

## 2022-05-13 NOTE — ED Notes (Signed)
Pt w/ saturated trach collar w/ blood, clot was noted around trach.

## 2022-05-13 NOTE — ED Notes (Signed)
Per pt chart, pt tested positive for coronavirus on pneumonia panel.

## 2022-05-13 NOTE — ED Notes (Signed)
Blood clot suctioned out of back of pt throat

## 2022-05-13 NOTE — ED Notes (Signed)
Family at bedside talking w/ MD. Spencer Humphrey at bedside at this time. Per Pfeiffer, obtain blood cultures from central line, and initiate abx.

## 2022-05-14 ENCOUNTER — Inpatient Hospital Stay (HOSPITAL_COMMUNITY): Payer: Managed Care, Other (non HMO)

## 2022-05-14 DIAGNOSIS — J9600 Acute respiratory failure, unspecified whether with hypoxia or hypercapnia: Secondary | ICD-10-CM | POA: Diagnosis not present

## 2022-05-14 LAB — TYPE AND SCREEN
ABO/RH(D): O POS
Antibody Screen: NEGATIVE
Unit division: 0
Unit division: 0

## 2022-05-14 LAB — MAGNESIUM: Magnesium: 2.1 mg/dL (ref 1.7–2.4)

## 2022-05-14 LAB — BPAM RBC
Blood Product Expiration Date: 202308232359
Blood Product Expiration Date: 202308252359
ISSUE DATE / TIME: 202307230736
ISSUE DATE / TIME: 202307230748
Unit Type and Rh: 5100
Unit Type and Rh: 5100

## 2022-05-14 LAB — BASIC METABOLIC PANEL
Anion gap: 8 (ref 5–15)
BUN: 31 mg/dL — ABNORMAL HIGH (ref 6–20)
CO2: 23 mmol/L (ref 22–32)
Calcium: 8.2 mg/dL — ABNORMAL LOW (ref 8.9–10.3)
Chloride: 109 mmol/L (ref 98–111)
Creatinine, Ser: 1.16 mg/dL (ref 0.61–1.24)
GFR, Estimated: >60 mL/min (ref >60)
Glucose, Bld: 167 mg/dL — ABNORMAL HIGH (ref 70–99)
Potassium: 4.7 mmol/L (ref 3.5–5.1)
Sodium: 140 mmol/L (ref 135–145)

## 2022-05-14 LAB — LACTIC ACID, PLASMA
Lactic Acid, Venous: 2.2 mmol/L (ref 0.5–1.9)
Lactic Acid, Venous: 1.4 mmol/L (ref 0.5–1.9)

## 2022-05-14 LAB — POCT I-STAT 7, (LYTES, BLD GAS, ICA,H+H)
Acid-base deficit: 1 mmol/L (ref 0.0–2.0)
Bicarbonate: 22.4 mmol/L (ref 20.0–28.0)
Calcium, Ion: 1.19 mmol/L (ref 1.15–1.40)
HCT: 29 % — ABNORMAL LOW (ref 39.0–52.0)
Hemoglobin: 9.9 g/dL — ABNORMAL LOW (ref 13.0–17.0)
O2 Saturation: 97 %
Patient temperature: 98.7
Potassium: 4.4 mmol/L (ref 3.5–5.1)
Sodium: 139 mmol/L (ref 135–145)
TCO2: 23 mmol/L (ref 22–32)
pCO2 arterial: 33.8 mmHg (ref 32–48)
pH, Arterial: 7.43 (ref 7.35–7.45)
pO2, Arterial: 89 mmHg (ref 83–108)

## 2022-05-14 LAB — GLUCOSE, CAPILLARY
Glucose-Capillary: 139 mg/dL — ABNORMAL HIGH (ref 70–99)
Glucose-Capillary: 144 mg/dL — ABNORMAL HIGH (ref 70–99)
Glucose-Capillary: 150 mg/dL — ABNORMAL HIGH (ref 70–99)
Glucose-Capillary: 153 mg/dL — ABNORMAL HIGH (ref 70–99)
Glucose-Capillary: 158 mg/dL — ABNORMAL HIGH (ref 70–99)
Glucose-Capillary: 169 mg/dL — ABNORMAL HIGH (ref 70–99)

## 2022-05-14 LAB — CBC WITH DIFFERENTIAL/PLATELET
Abs Immature Granulocytes: 0.25 10*3/uL — ABNORMAL HIGH (ref 0.00–0.07)
Basophils Absolute: 0.2 10*3/uL — ABNORMAL HIGH (ref 0.0–0.1)
Basophils Relative: 1 %
Eosinophils Absolute: 0.3 10*3/uL (ref 0.0–0.5)
Eosinophils Relative: 2 %
HCT: 29.8 % — ABNORMAL LOW (ref 39.0–52.0)
Hemoglobin: 9.7 g/dL — ABNORMAL LOW (ref 13.0–17.0)
Immature Granulocytes: 1 %
Lymphocytes Relative: 11 %
Lymphs Abs: 2.2 10*3/uL (ref 0.7–4.0)
MCH: 32.7 pg (ref 26.0–34.0)
MCHC: 32.6 g/dL (ref 30.0–36.0)
MCV: 100.3 fL — ABNORMAL HIGH (ref 80.0–100.0)
Monocytes Absolute: 2.2 10*3/uL — ABNORMAL HIGH (ref 0.1–1.0)
Monocytes Relative: 11 %
Neutro Abs: 14.2 10*3/uL — ABNORMAL HIGH (ref 1.7–7.7)
Neutrophils Relative %: 74 %
Platelets: 179 10*3/uL (ref 150–400)
RBC: 2.97 MIL/uL — ABNORMAL LOW (ref 4.22–5.81)
RDW: 19.1 % — ABNORMAL HIGH (ref 11.5–15.5)
WBC: 19.2 10*3/uL — ABNORMAL HIGH (ref 4.0–10.5)
nRBC: 0 % (ref 0.0–0.2)

## 2022-05-14 LAB — URINE CULTURE: Culture: NO GROWTH

## 2022-05-14 LAB — PHOSPHORUS: Phosphorus: 4.1 mg/dL (ref 2.5–4.6)

## 2022-05-14 MED ORDER — LACTATED RINGERS IV BOLUS
1000.0000 mL | Freq: Once | INTRAVENOUS | Status: AC
  Administered 2022-05-14: 1000 mL via INTRAVENOUS

## 2022-05-14 MED ORDER — SODIUM CHLORIDE 0.9% FLUSH: 10.0000 mL | INTRAVENOUS | Status: DC | PRN

## 2022-05-14 MED ORDER — PROPOFOL 1000 MG/100ML IV EMUL
5.0000 ug/kg/min | INTRAVENOUS | Status: DC
  Administered 2022-05-14 (×2): 45 ug/kg/min via INTRAVENOUS
  Administered 2022-05-14: 20 ug/kg/min via INTRAVENOUS
  Administered 2022-05-14: 5 ug/kg/min via INTRAVENOUS
  Administered 2022-05-15 (×2): 45 ug/kg/min via INTRAVENOUS
  Administered 2022-05-15 – 2022-05-16 (×4): 30 ug/kg/min via INTRAVENOUS
  Administered 2022-05-16: 35 ug/kg/min via INTRAVENOUS
  Administered 2022-05-16 – 2022-05-17 (×4): 20 ug/kg/min via INTRAVENOUS
  Administered 2022-05-17: 18 ug/kg/min via INTRAVENOUS
  Filled 2022-05-14 (×8): qty 100
  Filled 2022-05-14: qty 200
  Filled 2022-05-14 (×3): qty 100
  Filled 2022-05-14: qty 200

## 2022-05-14 MED ORDER — SODIUM CHLORIDE 0.9% FLUSH
10.0000 mL | Freq: Two times a day (BID) | INTRAVENOUS | Status: DC
  Administered 2022-05-14 – 2022-05-18 (×8): 10 mL

## 2022-05-14 MED ORDER — VASOPRESSIN 20 UNITS/100 ML INFUSION FOR SHOCK
0.0000 [IU]/min | INTRAVENOUS | Status: DC
  Administered 2022-05-14 – 2022-05-15 (×6): 0.03 [IU]/min via INTRAVENOUS
  Filled 2022-05-14 (×5): qty 100

## 2022-05-14 MED ORDER — INSULIN ASPART 100 UNIT/ML IJ SOLN
2.0000 [IU] | INTRAMUSCULAR | Status: DC
  Administered 2022-05-14: 2 [IU] via SUBCUTANEOUS
  Administered 2022-05-14: 4 [IU] via SUBCUTANEOUS
  Administered 2022-05-14 (×2): 2 [IU] via SUBCUTANEOUS
  Administered 2022-05-14 – 2022-05-15 (×4): 4 [IU] via SUBCUTANEOUS
  Administered 2022-05-15 (×3): 2 [IU] via SUBCUTANEOUS
  Administered 2022-05-15: 4 [IU] via SUBCUTANEOUS
  Administered 2022-05-16: 2 [IU] via SUBCUTANEOUS
  Administered 2022-05-16 (×2): 4 [IU] via SUBCUTANEOUS
  Administered 2022-05-16 – 2022-05-22 (×17): 2 [IU] via SUBCUTANEOUS

## 2022-05-14 NOTE — Progress Notes (Signed)
eLink Physician-Brief Progress Note Patient Name: Spencer Humphrey DOB: 20-Mar-1966 MRN: [id number]   Date of Service  05/14/2022  HPI/Events of Note  Notified of hyperglycemia with glucoses in the range of 160-180.   Pt also heme occult positive.  Last H&H compared to AM 9.9/29 <-- 9.8/31.3.  eICU Interventions  Start on insulin sliding scale Q4hrs.  Continue to monitor H&H.      Intervention Category Intermediate Interventions: Hyperglycemia - evaluation and treatment;Other:  Larinda Buttery 05/14/2022, 12:15 AM

## 2022-05-14 NOTE — Progress Notes (Signed)
RT obtained respiratory sputum culture and sent to main lab at this time.

## 2022-05-14 NOTE — Progress Notes (Signed)
ENT follow-up  No bleeding since admission.  Wife is in the room.  Patient is on ventilator and on pressors.  Tracheostomy wound is dry.  No active or recent bleeding.  Continue critical care and will reevaluate for replacement of tracheostomy in the next few days if needed.

## 2022-05-14 NOTE — Progress Notes (Signed)
NAME:  Spencer Humphrey, MRN:  [id number], DOB:  09-08-1966, LOS: 1 ADMISSION DATE:  05/05/2022, CONSULTATION DATE:  05/04/2022 REFERRING MD:  Johnney Killian - EM, CHIEF COMPLAINT:  tracheostomy complication.    History of Present Illness:  56 yo CM PMH etoh abuse, etoh withdrawal seizure, s/p trachostomy, MSSA VAP strep bacteremia who presented to [facility] ED 7/23 via  EMS from [facility] for bleeding trach and associated hypotension.   Admitted to [facility] from 7/6 - 7/20 with alcohol withdrawal seizures, tongue laceration.  Hospital course at [facility] complicated by vent associated pneumonia with MSSA, streptococcal bacteremia. Tracheostomy placed on 7/18 for failure to wean and transferred to [place]  In ED, required pressors for SBP 60s and transfusion via belmont. R fem CVC placed. ENT consulted. Endotracheally intubated and Trach removed, with no further active bleeding noted.   PCCM consulted for admission in this settng  Pertinent  Medical History  Etoh abuse Alcohol withdrawal seizure   Significant Hospital Events: Including procedures, antibiotic start and stop dates in addition to other pertinent events   7/23 ED from [facility] with trach bleeding. Orally intubated in the ED, ENT consulted  Interim History / Subjective:   Remains on the ventilator and on pressors Requiring high sedation due to intermittent agitation.  Currently on fentanyl and Precedex, benzos as needed.  Objective   Blood pressure 126/79, pulse 93, temperature (!) 97.3 F (36.3 C), temperature source Esophageal, resp. rate (!) 37, height 6' (1.829 m), weight 105.3 kg, SpO2 98 %.    Vent Mode: PRVC FiO2 (%):  [40 %-50 %] 40 % Set Rate:  [12 bmp] 12 bmp Vt Set:  [550 mL-620 mL] 620 mL PEEP:  [5 cmH20] 5 cmH20 Plateau Pressure:  [13 cmH20-20 cmH20] 17 cmH20   Intake/Output Summary (Last 24 hours) at 05/14/2022 0753 Last data filed at 05/14/2022 0521 Gross per 24 hour  Intake 3228.95 ml  Output 920 ml   Net 2308.95 ml   Filed Weights   05/20/2022 0909 04/25/2022 0917 05/14/22 0500  Weight: 104.3 kg 104.3 kg 105.3 kg    Examination: Gen:      No acute distress, obese HEENT:  EOMI, sclera anicteric Neck:     No masses; no thyromegaly, tracheostomy Lungs:    Coarse rhonchi CV:         Regular rate and rhythm; no murmurs Abd:      + bowel sounds; soft, non-tender; no palpable masses, no distension Ext:    No edema; adequate peripheral perfusion Skin:      Warm and dry; no rash Neuro: Sedated, unresponsive.  Pupils reactive  Labs/imaging personally reviewed Significant for alk phos 160, AST 813, ALT 39 WBC 19.2, hemoglobin 9.7, platelets 179 No new imaging  Resolved Hospital Problem list     Assessment & Plan:   Acute respiratory failure with hypoxia  VDRF, s/p tracheostomy -- in setting of tongue lac from etoh withdrawal seizure Tracheostomy complication with new bleeding  Risk for chemical pneumonitis in setting of tracheal bleeding  Hx MSSA, streptococcal bacteremia Hx Enterococcus faecalis sinusitis  -trach placed at [facility] 7/18. At [place] on rounds 7/23 noted to have extensive bleeding  P Continue broad antibiotic coverage with Vanco, cefepime Follow cultures, tracheal aspirate Continue vent support for now.  Will not wean due to agitation and hemodynamic instability Follow intermittent chest x-ray Will eventually trach revision by ENT.  Septic shock, present on admission -? Component of hypovolemic with blood loss but doesn't sound like  ongoing bleed was seen by ENT  Strep Bacteremia  -known MSSA PNA and strep bacteremia. Sounds like also some sinusitis and persistent fevers at [facility]. Arrives here with T 100.9 -new onset bleeding from trach  P Antibiotics as above -Bcx, UA, trach aspirate  Continue Levophed, add vasopressin.  Acute encephalopathy -- shock, toxic metabolic  EtOH abuse disorder Recent alcohol withdrawal c/b seizures -1 sz on 7/8. When  withdrawal window passed, AEDs weaned and reportedly no further sz activity  -CT head 7/11 No acute findings, MRI brain 7/15 no acute findings, LP obtained and was non-infectious. Started lactulose for hyperammonemia -at time of discharge, noted to have " improving" mental status but not at baseline.  P Supportive care Change Precedex to propofol.  Continue fentanyl, Versed as needed   Best Practice (right click and "Reselect all SmartList Selections" daily)   Diet/type: NPO DVT prophylaxis: SCD GI prophylaxis: PPI Lines: Central line Foley:  Yes, and it is still needed Code Status:  full code Last date of multidisciplinary goals of care discussion [--]  Critical care time:    The patient is critically ill with multiple organ system failure and requires high complexity decision making for assessment and support, frequent evaluation and titration of therapies, advanced monitoring, review of radiographic studies and interpretation of complex data.   Critical Care Time devoted to patient care services, exclusive of separately billable procedures, described in this note is 35 minutes.   Marshell Garfinkel MD [place] [facility] See Amion for pager  If no response to pager , please call [phone] until 7pm After 7:00 pm call Elink  [phone] 05/14/2022, 8:22 AM

## 2022-05-14 NOTE — Progress Notes (Signed)
Initial Nutrition Assessment  DOCUMENTATION CODES:   Not applicable  INTERVENTION:  When cortrak tube in place, recommend the following: Vital AF 1.2 @ 6mL/h (1566mL/d) Start at 36mL/h and advance by 2mL q6h to goal of 28mL/h Prosource TF 1x/d (40kcal and 11g of protein) This will provide 1912kcal, 128g of protein, of free water. TF+propofol = 2662 kcal/d MVI with minerals, folic acid 1mg , and 100mg  of thiamine daily for hx of EtOH abuse  NUTRITION DIAGNOSIS:   Inadequate oral intake related to inability to eat as evidenced by NPO status.  GOAL:   Provide needs based on ASPEN/SCCM guidelines ([employer])  MONITOR:   Vent status, Labs, TF tolerance, I & O's  REASON FOR ASSESSMENT:   Consult Assessment of nutrition requirement/status  ASSESSMENT:   Pt with hx of EtOH abuse and HTN presented to ED from [facility] with severe bleeding from recently placed trach. Pt emergently intubated in ED and trach removed by ENT.  Pt recently admitted at [facility] for withdrawal seizures with resulting tongue laceration. Vented and ultimately underwent trach placement during that admission.   Patient is currently intubated on ventilator support, no family present at bedside. Pressor support x 2 but rates stable. Pt has Dobbhoff tube from outside facility in place secured with a bridle as well as OGT to LIS. Per imaging, both tubes terminate slightly past the GE junction.   Discussed with RN and MD. DHT to be removed and post-pyloric cortrak to be placed to initiate EN in the AM. Also recommended advancing OGT several centimeters for more ideal placement for suction.    Will place TF recommendations above to be initiated when pt has enteral access. Propofol rate increased over the last 24 hours, providing a significant source of kcal.  MV: 7.5 L/min Temp (24hrs), Avg:98.2 F (36.8 C), Min:96.8 F (36 C), Max:100.2 F (37.9 C)  Propofol: 28.4 ml/hr  (750kcal/d)   Intake/Output Summary (Last 24 hours) at 05/14/2022 2120 Last data filed at 05/14/2022 1831 Gross per 24 hour  Intake 1211.61 ml  Output 1670 ml  Net -458.39 ml  Net IO Since Admission: 1,561.37 mL [05/14/22 2120]  Nutritionally Relevant Medications: Scheduled Meds:  docusate  100 mg Per Tube BID   insulin aspart  2-6 Units Subcutaneous Q4H   pantoprazole (PROTONIX) IV  40 mg Intravenous QHS   polyethylene glycol  17 g Per Tube Daily   Continuous Infusions:  ceFEPime (MAXIPIME) IV 2 g (05/14/22 0521)   dexmedetomidine (PRECEDEX) IV infusion 0.44 mcg/kg/hr (05/14/22 1021)   fentaNYL infusion INTRAVENOUS 200 mcg/hr (05/14/22 0800)   norepinephrine (LEVOPHED) Adult infusion 10 mcg/min (05/14/22 1230)   propofol (DIPRIVAN) infusion 20 mcg/kg/min (05/14/22 0917)   vancomycin 1,750 mg (05/14/22 1225)   vasopressin 0.03 Units/min (05/14/22 0856)   PRN Meds: docusate, ondansetron, polyethylene glycol  Labs Reviewed: BUN 31 CBG ranges from 139-184 mg/dL over the last 24 hours  NUTRITION - FOCUSED PHYSICAL EXAM:  Flowsheet Row Most Recent Value  Orbital Region No depletion  Upper Arm Region Mild depletion  Thoracic and Lumbar Region No depletion  Buccal Region No depletion  Temple Region Mild depletion  Clavicle Bone Region No depletion  Clavicle and Acromion Bone Region No depletion  Scapular Bone Region No depletion  Dorsal Hand No depletion  Patellar Region Mild depletion  Anterior Thigh Region Mild depletion  Posterior Calf Region Mild depletion  Edema (RD Assessment) Mild  Hair Reviewed  Eyes Unable to assess  Mouth Unable to assess  Skin Reviewed  Nails Reviewed    Diet Order:   Diet Order             Diet NPO time specified  Diet effective now                   EDUCATION NEEDS:   Not appropriate for education at this time  Skin:  Skin Assessment: Reviewed RN Assessment  Last BM:  7/23  Height:   Ht Readings from Last 1  Encounters:  05-28-22 6' (1.829 m)    Weight:   Wt Readings from Last 1 Encounters:  05/14/22 105.3 kg    Ideal Body Weight:  80.9 kg  BMI:  Body mass index is 31.48 kg/m.  Estimated Nutritional Needs:   Kcal:  2500-2700 kcal/d  Protein:  125-140g/d  Fluid:  >/=2.5L/d    Greig Castilla, RD, LDN Clinical Dietitian RD pager # available in [facility]  After hours/weekend pager # available in [facility]

## 2022-05-14 NOTE — Progress Notes (Signed)
eLink Physician-Brief Progress Note Patient Name: AYSEN SHIEH DOB: 09/09/1966 MRN: [id number]   Date of Service  05/14/2022  HPI/Events of Note  Nursing reports a popping sound in RUL and requests order for CXR.   eICU Interventions  Plan: Portable CXR STAT.     Intervention Category Major Interventions: Respiratory failure - evaluation and management  Hillery Zachman Dennard Nip 05/14/2022, 8:18 PM

## 2022-05-15 ENCOUNTER — Inpatient Hospital Stay (HOSPITAL_COMMUNITY): Payer: Managed Care, Other (non HMO)

## 2022-05-15 DIAGNOSIS — L8990 Pressure ulcer of unspecified site, unspecified stage: Secondary | ICD-10-CM | POA: Insufficient documentation

## 2022-05-15 DIAGNOSIS — J9500 Unspecified tracheostomy complication: Secondary | ICD-10-CM

## 2022-05-15 LAB — CBC
HCT: 25.7 % — ABNORMAL LOW (ref 39.0–52.0)
Hemoglobin: 8.4 g/dL — ABNORMAL LOW (ref 13.0–17.0)
MCH: 34.1 pg — ABNORMAL HIGH (ref 26.0–34.0)
MCHC: 32.7 g/dL (ref 30.0–36.0)
MCV: 104.5 fL — ABNORMAL HIGH (ref 80.0–100.0)
Platelets: 140 10*3/uL — ABNORMAL LOW (ref 150–400)
RBC: 2.46 MIL/uL — ABNORMAL LOW (ref 4.22–5.81)
RDW: 18.9 % — ABNORMAL HIGH (ref 11.5–15.5)
WBC: 12.5 10*3/uL — ABNORMAL HIGH (ref 4.0–10.5)
nRBC: 0.2 % (ref 0.0–0.2)

## 2022-05-15 LAB — COMPREHENSIVE METABOLIC PANEL
ALT: 35 U/L (ref 0–44)
AST: 75 U/L — ABNORMAL HIGH (ref 15–41)
Albumin: 2 g/dL — ABNORMAL LOW (ref 3.5–5.0)
Alkaline Phosphatase: 116 U/L (ref 38–126)
Anion gap: 8 (ref 5–15)
BUN: 31 mg/dL — ABNORMAL HIGH (ref 6–20)
CO2: 21 mmol/L — ABNORMAL LOW (ref 22–32)
Calcium: 8.4 mg/dL — ABNORMAL LOW (ref 8.9–10.3)
Chloride: 107 mmol/L (ref 98–111)
Creatinine, Ser: 1.05 mg/dL (ref 0.61–1.24)
GFR, Estimated: >60 mL/min (ref >60)
Glucose, Bld: 158 mg/dL — ABNORMAL HIGH (ref 70–99)
Potassium: 4.3 mmol/L (ref 3.5–5.1)
Sodium: 136 mmol/L (ref 135–145)
Total Bilirubin: 1.3 mg/dL — ABNORMAL HIGH (ref 0.3–1.2)
Total Protein: 6.3 g/dL — ABNORMAL LOW (ref 6.5–8.1)

## 2022-05-15 LAB — GLUCOSE, CAPILLARY
Glucose-Capillary: 139 mg/dL — ABNORMAL HIGH (ref 70–99)
Glucose-Capillary: 139 mg/dL — ABNORMAL HIGH (ref 70–99)
Glucose-Capillary: 140 mg/dL — ABNORMAL HIGH (ref 70–99)
Glucose-Capillary: 155 mg/dL — ABNORMAL HIGH (ref 70–99)
Glucose-Capillary: 145 mg/dL — ABNORMAL HIGH (ref 70–99)
Glucose-Capillary: 162 mg/dL — ABNORMAL HIGH (ref 70–99)
Glucose-Capillary: 165 mg/dL — ABNORMAL HIGH (ref 70–99)

## 2022-05-15 LAB — TRIGLYCERIDES: Triglycerides: 276 mg/dL — ABNORMAL HIGH (ref <150)

## 2022-05-15 MED ORDER — BETHANECHOL CHLORIDE 10 MG PO TABS
10.0000 mg | ORAL_TABLET | Freq: Three times a day (TID) | ORAL | Status: AC
  Administered 2022-05-15 – 2022-05-17 (×7): 10 mg
  Filled 2022-05-15 (×9): qty 1

## 2022-05-15 MED ORDER — VITAL AF 1.2 CAL PO LIQD
1000.0000 mL | ORAL | Status: DC
  Administered 2022-05-15 – 2022-05-17 (×2): 1000 mL

## 2022-05-15 MED ORDER — BACITRACIN ZINC 500 UNIT/GM EX OINT
TOPICAL_OINTMENT | Freq: Two times a day (BID) | CUTANEOUS | Status: DC
  Administered 2022-05-18 – 2022-05-20 (×2): 1 via TOPICAL
  Filled 2022-05-15: qty 28.4

## 2022-05-15 MED ORDER — PROSOURCE TF PO LIQD
45.0000 mL | Freq: Every day | ORAL | Status: DC
  Administered 2022-05-15 – 2022-05-17 (×3): 45 mL
  Filled 2022-05-15 (×3): qty 45

## 2022-05-15 MED ORDER — FREE WATER
30.0000 mL | Status: DC
Start: 2022-05-15 — End: 2022-05-18
  Administered 2022-05-15 – 2022-05-18 (×16): 30 mL

## 2022-05-15 NOTE — Progress Notes (Signed)
NAME:  Spencer Humphrey, MRN:  [id number], DOB:  [birth year], LOS: 2 ADMISSION DATE:  06-10-22, CONSULTATION DATE:  06-10-2022 REFERRING MD:  Donnald Garre - EM, CHIEF COMPLAINT:  tracheostomy complication.    History of Present Illness:  56 yo CM PMH etoh abuse, etoh withdrawal seizure, s/p trachostomy, MSSA VAP strep bacteremia who presented to [facility] ED 7/23 via  EMS from [facility] for bleeding trach and associated hypotension.   Admitted to [facility] from 7/6 - 7/20 with alcohol withdrawal seizures, tongue laceration.  Hospital course at [facility] complicated by vent associated pneumonia with MSSA, streptococcal bacteremia. Tracheostomy placed on 7/18 for failure to wean and transferred to [facility]  In ED, required pressors for SBP 60s and transfusion via belmont. R fem CVC placed. ENT consulted. Endotracheally intubated and Trach removed, with no further active bleeding noted.   PCCM consulted for admission in this settng  Pertinent  Medical History  Etoh abuse Alcohol withdrawal seizure   Significant Hospital Events: Including procedures, antibiotic start and stop dates in addition to other pertinent events   7/23 ED from [facility] with trach bleeding. Orally intubated in the ED, ENT consulted  Interim History / Subjective:   Weaning off pressors.  Continues on propofol and fentanyl  Objective   Blood pressure 128/64, pulse (!) 125, temperature 98.9 F (37.2 C), temperature source Core, resp. rate 14, height 6' (1.829 m), weight 108 kg, SpO2 99 %.    Vent Mode: PRVC FiO2 (%):  [40 %] 40 % Set Rate:  [12 bmp] 12 bmp Vt Set:  [892 mL] 620 mL PEEP:  [5 cmH20] 5 cmH20 Plateau Pressure:  [15 cmH20-17 cmH20] 17 cmH20   Intake/Output Summary (Last 24 hours) at 05/15/2022 0830 Last data filed at 05/15/2022 1194 Gross per 24 hour  Intake 970.34 ml  Output 825 ml  Net 145.34 ml   Filed Weights   June 10, 2022 0917 05/14/22 0500 05/15/22 0457  Weight: 104.3 kg 105.3 kg 108 kg     Examination: Gen:      No acute distress HEENT:  EOMI, sclera anicteric Neck:     No masses; no thyromegaly, ETT Lungs:    Coarse rhonchi CV:         Regular rate and rhythm; no murmurs Abd:      + bowel sounds; soft, non-tender; no palpable masses, no distension Ext:    No edema; adequate peripheral perfusion Skin:      Warm and dry; no rash Neuro: Sedated, unresponsive  Labs/imaging personally reviewed Significant for BUN/creatinine 31/1.05, AST 75 WBC improved to 12.5, hemoglobin 8.4, platelets 140 Chest x-ray with no acute abnormality  Resolved Hospital Problem list     Assessment & Plan:   Acute respiratory failure with hypoxia  VDRF, s/p tracheostomy -- in setting of tongue lac from etoh withdrawal seizure Tracheostomy complication with new bleeding  Risk for chemical pneumonitis in setting of tracheal bleeding  Hx MSSA, streptococcal bacteremia Hx Enterococcus faecalis sinusitis  -trach placed at [facility] 7/18. At [facility] on rounds 7/23 noted to have extensive bleeding  P Continue broad antibiotic coverage with Vanco, cefepime Follow cultures, tracheal aspirate Start pressure support weans as tolerated Follow intermittent chest x-ray May eventually trach revision by ENT.  Septic shock, present on admission -? Component of hypovolemic with blood loss but doesn't sound like ongoing bleed was seen by ENT  Strep Bacteremia  -known MSSA PNA and strep bacteremia. Sounds like also some sinusitis and persistent fevers at [facility]. Arrives here  with T 100.9 -new onset bleeding from trach  P Antibiotics as above Follow cultures Weaning pressors  Acute encephalopathy -- shock, toxic metabolic  EtOH abuse disorder Recent alcohol withdrawal c/b seizures -1 sz on 7/8. When withdrawal window passed, AEDs weaned and reportedly no further sz activity  -CT head 7/11 No acute findings, MRI brain 7/15 no acute findings, LP obtained and was non-infectious. Started lactulose for  hyperammonemia -at time of discharge, noted to have " improving" mental status but not at baseline.  P Supportive care Wean pressors  Best Practice (right click and "Reselect all SmartList Selections" daily)   Diet/type: tubefeeds DVT prophylaxis: SCD GI prophylaxis: PPI Lines: Central line Foley:  Yes, and it is still needed.  Foley needed to be placed back for urinary retention Code Status:  full code Last date of multidisciplinary goals of care discussion [--]  Critical care time:    The patient is critically ill with multiple organ system failure and requires high complexity decision making for assessment and support, frequent evaluation and titration of therapies, advanced monitoring, review of radiographic studies and interpretation of complex data.   Critical Care Time devoted to patient care services, exclusive of separately billable procedures, described in this note is 35 minutes.   Chilton Greathouse MD [place] [facility] See Amion for pager  If no response to pager , please call [phone] until 7pm After 7:00 pm call Elink  [phone] 05/15/2022, 8:39 AM

## 2022-05-15 NOTE — Plan of Care (Signed)

## 2022-05-15 NOTE — Progress Notes (Addendum)
Purulent, drainage noted at stoma site. No bleeding noted. Area cleansed, dried and dressing applied by RN/RT

## 2022-05-15 NOTE — Progress Notes (Signed)
eLink Physician-Brief Progress Note Patient Name: Spencer Humphrey DOB: 03/18/1966 MRN: [id number]   Date of Service  05/15/2022  HPI/Events of Note  Review of CXR reveals: 1. Support tube positioning as above. The prior Dobbhoff feeding tube has been exchanged for a standard NGT. A 12-13 cm length of this tubing is folded back on itself at the superior aspect of the film, possibly within the cervical esophagus, with kinking at the fold. Clinical correlation advised. 2. Expiratory study with increased opacity in the retrocardiac left lower lobe which could be increased atelectasis, consolidation or aspiration. 3. Cardiomegaly.  Aortic atherosclerosis. Nothing appears to be kinked outside of patient's body. OGT appears to enter the stomach and is functioning well. The patient also has an esophageal probe for TTM.  eICU Interventions  Plan: Nursing to remove and reposition esophageal probe.      Intervention Category Major Interventions: Other:  Brytani Voth Dennard Nip 05/15/2022, 2:15 AM

## 2022-05-15 NOTE — TOC Progression Note (Signed)
Transition of Care [facility]) - Initial/Assessment Note    Patient Details  Name: Spencer Humphrey MRN: [id number] Date of Birth: January 09, 1966  [facility] CM/SW Contact:    Ralene Bathe, LCSWA Phone Number: 05/15/2022, 1:43 PM  Clinical Narrative:                 CSW contacted Irving Burton with [facility] and verified that the patient can return when medically ready.  The facility will need to submit for insurance auth prior to the patient returning.    Expected Discharge Plan: Long Term Acute Care (LTAC) Barriers to Discharge: Continued Medical Work up   Patient Goals and CMS Choice        Expected Discharge Plan and Services Expected Discharge Plan: Long Term Acute Care (LTAC)       Living arrangements for the past 2 months: Post-Acute Facility                                      Prior Living Arrangements/Services Living arrangements for the past 2 months: Post-Acute Facility   Patient language and need for interpreter reviewed:: Yes        Need for Family Participation in Patient Care: Yes (Comment) Care giver support system in place?: Yes (comment)   Criminal Activity/Legal Involvement Pertinent to Current Situation/Hospitalization: No - Comment as needed  Activities of Daily Living Home Assistive Devices/Equipment:  (UTA) ADL Screening (condition at time of admission) Patient's cognitive ability adequate to safely complete daily activities?: No Is the patient deaf or have difficulty hearing?: No Does the patient have difficulty seeing, even when wearing glasses/contacts?: No Does the patient have difficulty concentrating, remembering, or making decisions?: Yes (uta PT is ventilated) Patient able to express need for assistance with ADLs?: No Does the patient have difficulty dressing or bathing?:  (uta PT is ventilated) Independently performs ADLs?:  (UTA) Does the patient have difficulty walking or climbing stairs?:  Rich Reining PT is  ventilated) Weakness of Legs: Both Weakness of Arms/Hands: Both  Permission Sought/Granted                  Emotional Assessment   Attitude/Demeanor/Rapport: Intubated (Following Commands or Not Following Commands)     Alcohol / Substance Use: Not Applicable Psych Involvement: No (comment)  Admission diagnosis:  Tracheostomy hemorrhage (HCC) [J95.01] Tracheostomy complication (HCC) [J95.00] Severe comorbid illness [R69] Patient Active Problem List   Diagnosis Date Noted   Pressure injury of skin 05/15/2022   Tracheostomy complication (HCC) 05/05/2022   Anemia 05/30/2018   Obesity (BMI 30.0-34.9) 02/11/2015   Medication management 11/11/2014   Seizure (HCC) 02/12/2014   Hyperlipidemia 09/23/2013   Vitamin D deficiency 09/21/2013   Hypertension    Kidney stones    Plantar fasciitis    Prediabetes    DJD (degenerative joint disease) of knee    PCP:  Courtney Paris, NP Pharmacy:   [id number] [place], [place] - [address] [place] [address] Phone: [phone] Fax: [phone]     Social Determinants of Health (SDOH) Interventions    Readmission Risk Interventions     No data to display

## 2022-05-15 NOTE — Procedures (Signed)
Cortrak  Tube Type:  Cortrak - 43 inches Tube Location:  Right nare Initial Placement:  Stomach Secured by: Bridle Technique Used to Measure Tube Placement:  Marking at nare/corner of mouth Cortrak Secured At:  69 cm   Cortrak Tube Team Note:  Consult received to place a Cortrak feeding tube.   X-ray is required, abdominal x-ray has been ordered by the Cortrak team. Please confirm tube placement before using the Cortrak tube.   If the tube becomes dislodged please keep the tube and contact the Cortrak team at [email or web address] ([email or web address]) for replacement.  If after hours and replacement cannot be delayed, place a NG tube and confirm placement with an abdominal x-ray.    Betsey Holiday MS, RD, LDN Please refer to [facility] for RD and/or RD on-call/weekend/after hours pager

## 2022-05-15 NOTE — Progress Notes (Signed)
Brief Nutrition Note  Cortrak tube placed this AM and confirmed to be in the proximal gastric body per imaging. Per MD, ok to start trickles this AM and advance as pt shows tolerance. Will monitor for further evidence of GI bleed.  INTERVENTION:  Recommend the following: Vital AF 1.2 @ 40mL/h (1574mL/d) Start at 42mL/h and hold rate at trickle until pt shows tolerance Prosource TF 1x/d (40kcal and 11g of protein) At goal, this will provide 1912kcal, 128g of protein, of free water. TF at goal + propofol = 2662 kcal/d  Greig Castilla, RD, LDN Clinical Dietitian RD pager # available in [facility]  After hours/weekend pager # available in [facility]

## 2022-05-16 DIAGNOSIS — J9500 Unspecified tracheostomy complication: Secondary | ICD-10-CM | POA: Diagnosis not present

## 2022-05-16 LAB — COMPREHENSIVE METABOLIC PANEL
ALT: 30 U/L (ref 0–44)
AST: 62 U/L — ABNORMAL HIGH (ref 15–41)
Albumin: 1.9 g/dL — ABNORMAL LOW (ref 3.5–5.0)
Alkaline Phosphatase: 138 U/L — ABNORMAL HIGH (ref 38–126)
Anion gap: 11 (ref 5–15)
BUN: 27 mg/dL — ABNORMAL HIGH (ref 6–20)
CO2: 21 mmol/L — ABNORMAL LOW (ref 22–32)
Calcium: 8.7 mg/dL — ABNORMAL LOW (ref 8.9–10.3)
Chloride: 103 mmol/L (ref 98–111)
Creatinine, Ser: 0.79 mg/dL (ref 0.61–1.24)
GFR, Estimated: >60 mL/min (ref >60)
Glucose, Bld: 163 mg/dL — ABNORMAL HIGH (ref 70–99)
Potassium: 3.8 mmol/L (ref 3.5–5.1)
Sodium: 135 mmol/L (ref 135–145)
Total Bilirubin: 0.8 mg/dL (ref 0.3–1.2)
Total Protein: 5.8 g/dL — ABNORMAL LOW (ref 6.5–8.1)

## 2022-05-16 LAB — CBC
HCT: 23.6 % — ABNORMAL LOW (ref 39.0–52.0)
Hemoglobin: 7.4 g/dL — ABNORMAL LOW (ref 13.0–17.0)
MCH: 33.3 pg (ref 26.0–34.0)
MCHC: 31.4 g/dL (ref 30.0–36.0)
MCV: 106.3 fL — ABNORMAL HIGH (ref 80.0–100.0)
Platelets: 118 10*3/uL — ABNORMAL LOW (ref 150–400)
RBC: 2.22 MIL/uL — ABNORMAL LOW (ref 4.22–5.81)
RDW: 18.8 % — ABNORMAL HIGH (ref 11.5–15.5)
WBC: 9.6 10*3/uL (ref 4.0–10.5)
nRBC: 0.2 % (ref 0.0–0.2)

## 2022-05-16 LAB — GLUCOSE, CAPILLARY
Glucose-Capillary: 135 mg/dL — ABNORMAL HIGH (ref 70–99)
Glucose-Capillary: 135 mg/dL — ABNORMAL HIGH (ref 70–99)
Glucose-Capillary: 141 mg/dL — ABNORMAL HIGH (ref 70–99)
Glucose-Capillary: 152 mg/dL — ABNORMAL HIGH (ref 70–99)
Glucose-Capillary: 155 mg/dL — ABNORMAL HIGH (ref 70–99)
Glucose-Capillary: 167 mg/dL — ABNORMAL HIGH (ref 70–99)

## 2022-05-16 LAB — CULTURE, RESPIRATORY W GRAM STAIN: Culture: NORMAL

## 2022-05-16 MED ORDER — NOREPINEPHRINE 16 MG/250ML-% IV SOLN: 0.0000 ug/min | INTRAVENOUS | Status: DC

## 2022-05-16 MED ORDER — LACTULOSE 10 GM/15ML PO SOLN
20.0000 g | Freq: Once | ORAL | Status: AC
Start: 2022-05-16 — End: 2022-05-16
  Administered 2022-05-16: 20 g
  Filled 2022-05-16: qty 30

## 2022-05-16 MED ORDER — THIAMINE HCL 100 MG/ML IJ SOLN
100.0000 mg | Freq: Every day | INTRAMUSCULAR | Status: DC
  Administered 2022-05-16 – 2022-05-17 (×2): 100 mg via INTRAVENOUS
  Filled 2022-05-16 (×2): qty 2

## 2022-05-16 MED ORDER — ADULT MULTIVITAMIN W/MINERALS CH
1.0000 | ORAL_TABLET | Freq: Every day | ORAL | Status: DC
  Administered 2022-05-16 – 2022-05-21 (×6): 1
  Filled 2022-05-16 (×6): qty 1

## 2022-05-16 MED ORDER — SODIUM CHLORIDE 0.9 % IV SOLN
INTRAVENOUS | Status: DC | PRN
  Administered 2022-05-20: 10 mL/h via INTRAVENOUS

## 2022-05-16 MED ORDER — FOLIC ACID 1 MG PO TABS
1.0000 mg | ORAL_TABLET | Freq: Every day | ORAL | Status: DC
  Administered 2022-05-16 – 2022-05-21 (×6): 1 mg
  Filled 2022-05-16 (×6): qty 1

## 2022-05-16 MED ORDER — DEXMEDETOMIDINE HCL IN NACL 400 MCG/100ML IV SOLN
0.0000 ug/kg/h | INTRAVENOUS | Status: DC
  Administered 2022-05-16: 0.6 ug/kg/h via INTRAVENOUS
  Administered 2022-05-16: 0.7 ug/kg/h via INTRAVENOUS
  Administered 2022-05-17: 1.2 ug/kg/h via INTRAVENOUS
  Administered 2022-05-17 (×2): 0.4 ug/kg/h via INTRAVENOUS
  Administered 2022-05-17: 0.6 ug/kg/h via INTRAVENOUS
  Administered 2022-05-18: 0.5 ug/kg/h via INTRAVENOUS
  Administered 2022-05-18 (×2): 1.2 ug/kg/h via INTRAVENOUS
  Administered 2022-05-18: 0.5 ug/kg/h via INTRAVENOUS
  Administered 2022-05-18: 1.2 ug/kg/h via INTRAVENOUS
  Filled 2022-05-16 (×11): qty 100

## 2022-05-16 NOTE — Progress Notes (Signed)
NAME:  ALON MAZOR, MRN:  [id number], DOB:  10-21-1966, LOS: 3 ADMISSION DATE:  2022-06-02, CONSULTATION DATE:  06/02/2022 REFERRING MD:  Donnald Garre - EM, CHIEF COMPLAINT:  tracheostomy complication.    History of Present Illness:  56 yo CM PMH etoh abuse, etoh withdrawal seizure, s/p trachostomy, MSSA VAP strep bacteremia who presented to [facility] ED 7/23 via  EMS from [facility] for bleeding trach and associated hypotension.   Admitted to [facility] from 7/6 - 7/20 with alcohol withdrawal seizures, tongue laceration.  Hospital course at [facility] complicated by vent associated pneumonia with MSSA, streptococcal bacteremia. Tracheostomy placed on 7/18 for failure to wean and transferred to [facility]  In ED, required pressors for SBP 60s and transfusion via [place]mont. R fem CVC placed. ENT consulted. Endotracheally intubated and Trach removed, with no further active bleeding noted.   PCCM consulted for admission in this settng  Pertinent  Medical History  Etoh abuse Alcohol withdrawal seizure   Significant Hospital Events: Including procedures, antibiotic start and stop dates in addition to other pertinent events   7/23 ED from [facility] with trach bleeding. Orally intubated in the ED, ENT consulted 7/25 weaned off levo  Interim History / Subjective:  sedated on propofol/fentanyl Secretions appreciated around stoma Remains intubated on PRVC Off levo  Objective   Blood pressure 111/86, pulse (!) 120, temperature (!) 96.8 F (36 C), temperature source Bladder, resp. rate 18, height 6' (1.829 m), weight 105.7 kg, SpO2 97 %.    Vent Mode: PRVC FiO2 (%):  [30 %-40 %] 30 % Set Rate:  [12 bmp] 12 bmp Vt Set:  [588 mL] 620 mL PEEP:  [5 cmH20] 5 cmH20 Plateau Pressure:  [15 cmH20-18 cmH20] 15 cmH20   Intake/Output Summary (Last 24 hours) at 05/16/2022 0911 Last data filed at 05/16/2022 0600 Gross per 24 hour  Intake 1122.37 ml  Output 1100 ml  Net 22.37 ml    Filed Weights    05/14/22 0500 05/15/22 0457 05/16/22 0500  Weight: 105.3 kg 108 kg 105.7 kg    Examination: General:  critically ill appearing on mech vent HEENT: MM pink/moist; ETT in place; stoma with gauze dressing in place and oozing secretions Neuro: sedated; nurse states she weaned propofol and he followed commands; PERRL CV: s1s2, tachy 100-110s, no m/r/g PULM:  dim clear BS bilaterally; on mech vent PRVC GI: soft, bsx4 active  Extremities: warm/dry, no edema  Skin: no rashes or lesions appreciated  Resolved Hospital Problem list     Assessment & Plan:   Acute respiratory failure with hypoxia  VDRF, s/p tracheostomy -- in setting of tongue lac from etoh withdrawal seizure Tracheostomy complication with new bleeding  Risk for chemical pneumonitis in setting of tracheal bleeding  Hx MSSA, streptococcal bacteremia Hx Enterococcus faecalis sinusitis  -trach placed at [facility] 7/18. At [facility] on rounds 7/23 noted to have extensive bleeding  P: -LTVV strategy with tidal volumes of 6-8 cc/kg ideal body weight -PSV weaning trials as tolerated -Wean PEEP/FiO2 for SpO2 >92% -VAP bundle in place -PAD protocol in place; adding precedex and will try to come off propofol for weaning purposes -wean sedation for RASS goal 0 to -1 -continue cefepime -f/u trach cultures -Will likely need ENT consult for trach revision in future if unable to wean from vent  Septic shock, present on admission -? Component of hypovolemic with blood loss but doesn't sound like ongoing bleed was seen by ENT  Strep Bacteremia  -known MSSA PNA and strep bacteremia.  Sounds like also some sinusitis and persistent fevers at [facility] [place]. Arrives here with T 100.9 -new onset bleeding from trach  P: -continue cefepime -f/u cultures -weaned off pressors -trend wbc/fever curve  Acute encephalopathy -- shock, toxic metabolic  EtOH abuse disorder Recent alcohol withdrawal c/b seizures -1 sz on 7/8. When withdrawal window passed,  AEDs weaned and reportedly no further sz activity  -CT head 7/11 No acute findings, MRI brain 7/15 no acute findings, LP obtained and was non-infectious. Started lactulose for hyperammonemia -at time of discharge, noted to have " improving" mental status but not at baseline.  P: -limit sedating meds -switching sedation to precedex; continue fentanyl for RASS 0 to -1 -thiamine, folic acid, mvi  Anemia Thrombocytopenia P: -trend cbc -scd's for dvt ppx  Best Practice (right click and "Reselect all SmartList Selections" daily)   Diet/type: tubefeeds DVT prophylaxis: SCD GI prophylaxis: PPI Lines: Central line Foley:  Yes, and it is still needed.  Foley needed to be placed back for urinary retention Code Status:  full code Last date of multidisciplinary goals of care discussion [7/26 spoke with wife and bedside and updated on plan of care]  Critical care time: 35 minutes   JD Anselm Lis [place] [facility] 05/16/2022, 9:46 AM  Please see [email or web address] for pager details.  From 7A-7P if no response, please call [phone]. After hours, please call ELink [phone].

## 2022-05-16 NOTE — Progress Notes (Signed)
ENT follow-up  Patient sedated and on ventilator.  There is air leak from the tracheostomy wound when there is no packing.  The packing was removed.  Tissues are granulating.  No obvious infection.  We will start wet-to-dry dressing changes.  Change twice daily please.

## 2022-05-16 NOTE — Plan of Care (Signed)

## 2022-05-17 ENCOUNTER — Inpatient Hospital Stay (HOSPITAL_COMMUNITY): Payer: Managed Care, Other (non HMO)

## 2022-05-17 DIAGNOSIS — J9500 Unspecified tracheostomy complication: Secondary | ICD-10-CM | POA: Diagnosis not present

## 2022-05-17 LAB — GLUCOSE, CAPILLARY
Glucose-Capillary: 118 mg/dL — ABNORMAL HIGH (ref 70–99)
Glucose-Capillary: 148 mg/dL — ABNORMAL HIGH (ref 70–99)
Glucose-Capillary: 129 mg/dL — ABNORMAL HIGH (ref 70–99)
Glucose-Capillary: 135 mg/dL — ABNORMAL HIGH (ref 70–99)
Glucose-Capillary: 117 mg/dL — ABNORMAL HIGH (ref 70–99)
Glucose-Capillary: 140 mg/dL — ABNORMAL HIGH (ref 70–99)

## 2022-05-17 LAB — CBC
HCT: 18.7 % — ABNORMAL LOW (ref 39.0–52.0)
Hemoglobin: 6.6 g/dL — CL (ref 13.0–17.0)
MCH: 37.7 pg — ABNORMAL HIGH (ref 26.0–34.0)
MCHC: 35.3 g/dL (ref 30.0–36.0)
MCV: 106.9 fL — ABNORMAL HIGH (ref 80.0–100.0)
Platelets: 126 10*3/uL — ABNORMAL LOW (ref 150–400)
RBC: 1.75 MIL/uL — ABNORMAL LOW (ref 4.22–5.81)
RDW: 18.6 % — ABNORMAL HIGH (ref 11.5–15.5)
WBC: 6.7 10*3/uL (ref 4.0–10.5)
nRBC: 0.3 % — ABNORMAL HIGH (ref 0.0–0.2)

## 2022-05-17 LAB — BASIC METABOLIC PANEL
Anion gap: 9 (ref 5–15)
Anion gap: 7 (ref 5–15)
BUN: 29 mg/dL — ABNORMAL HIGH (ref 6–20)
BUN: 31 mg/dL — ABNORMAL HIGH (ref 6–20)
CO2: 20 mmol/L — ABNORMAL LOW (ref 22–32)
CO2: 23 mmol/L (ref 22–32)
Calcium: 8.5 mg/dL — ABNORMAL LOW (ref 8.9–10.3)
Calcium: 9.2 mg/dL (ref 8.9–10.3)
Chloride: 104 mmol/L (ref 98–111)
Chloride: 110 mmol/L (ref 98–111)
Creatinine, Ser: 1.19 mg/dL (ref 0.61–1.24)
Creatinine, Ser: 1.29 mg/dL — ABNORMAL HIGH (ref 0.61–1.24)
GFR, Estimated: >60 mL/min (ref >60)
GFR, Estimated: >60 mL/min (ref >60)
Glucose, Bld: 134 mg/dL — ABNORMAL HIGH (ref 70–99)
Glucose, Bld: 146 mg/dL — ABNORMAL HIGH (ref 70–99)
Potassium: 3.5 mmol/L (ref 3.5–5.1)
Potassium: 3.4 mmol/L — ABNORMAL LOW (ref 3.5–5.1)
Sodium: 133 mmol/L — ABNORMAL LOW (ref 135–145)
Sodium: 140 mmol/L (ref 135–145)

## 2022-05-17 LAB — HEMOGLOBIN AND HEMATOCRIT, BLOOD
HCT: 26.9 % — ABNORMAL LOW (ref 39.0–52.0)
Hemoglobin: 8.6 g/dL — ABNORMAL LOW (ref 13.0–17.0)

## 2022-05-17 LAB — PREPARE RBC (CROSSMATCH)

## 2022-05-17 LAB — MAGNESIUM: Magnesium: 2.2 mg/dL (ref 1.7–2.4)

## 2022-05-17 MED ORDER — ALBUMIN HUMAN 25 % IV SOLN
INTRAVENOUS | Status: AC
  Administered 2022-05-17: 12.5 g via INTRAVENOUS
  Filled 2022-05-17: qty 50

## 2022-05-17 MED ORDER — FUROSEMIDE 10 MG/ML IJ SOLN
40.0000 mg | Freq: Once | INTRAMUSCULAR | Status: AC
  Administered 2022-05-17: 40 mg via INTRAVENOUS
  Filled 2022-05-17: qty 4

## 2022-05-17 MED ORDER — POTASSIUM CHLORIDE 20 MEQ PO PACK
40.0000 meq | PACK | Freq: Once | ORAL | Status: AC
  Administered 2022-05-17: 40 meq
  Filled 2022-05-17: qty 2

## 2022-05-17 MED ORDER — PROSOURCE TF PO LIQD
45.0000 mL | Freq: Three times a day (TID) | ORAL | Status: DC
  Administered 2022-05-17 – 2022-05-21 (×12): 45 mL
  Filled 2022-05-17 (×12): qty 45

## 2022-05-17 MED ORDER — SODIUM CHLORIDE 0.9% IV SOLUTION: Freq: Once | INTRAVENOUS | Status: DC

## 2022-05-17 MED ORDER — HYDROMORPHONE HCL 1 MG/ML IJ SOLN
0.5000 mg | Freq: Four times a day (QID) | INTRAMUSCULAR | Status: DC | PRN
  Administered 2022-05-18 (×5): 0.5 mg via INTRAVENOUS
  Filled 2022-05-17 (×5): qty 0.5

## 2022-05-17 MED ORDER — ALBUTEROL SULFATE (2.5 MG/3ML) 0.083% IN NEBU
2.5000 mg | INHALATION_SOLUTION | Freq: Four times a day (QID) | RESPIRATORY_TRACT | Status: DC
  Administered 2022-05-17 – 2022-05-18 (×4): 2.5 mg via RESPIRATORY_TRACT
  Filled 2022-05-17 (×4): qty 3

## 2022-05-17 MED ORDER — ALBUMIN HUMAN 25 % IV SOLN: 12.5000 g | Freq: Once | INTRAVENOUS | Status: AC

## 2022-05-17 MED ORDER — PANTOPRAZOLE 2 MG/ML SUSPENSION
40.0000 mg | Freq: Two times a day (BID) | ORAL | Status: DC
  Administered 2022-05-17 – 2022-05-21 (×10): 40 mg
  Filled 2022-05-17 (×10): qty 20

## 2022-05-17 MED ORDER — FUROSEMIDE 10 MG/ML IJ SOLN
INTRAMUSCULAR | Status: AC
  Filled 2022-05-17: qty 4

## 2022-05-17 MED ORDER — SODIUM CHLORIDE 3 % IN NEBU
4.0000 mL | INHALATION_SOLUTION | Freq: Four times a day (QID) | RESPIRATORY_TRACT | Status: DC
  Administered 2022-05-17 – 2022-05-18 (×4): 4 mL via RESPIRATORY_TRACT
  Filled 2022-05-17 (×4): qty 4

## 2022-05-17 MED ORDER — VITAL 1.5 CAL PO LIQD
1000.0000 mL | ORAL | Status: DC
  Administered 2022-05-18: 1000 mL
  Filled 2022-05-17: qty 1000

## 2022-05-17 MED ORDER — POTASSIUM CHLORIDE 20 MEQ PO PACK
40.0000 meq | PACK | Freq: Once | ORAL | Status: AC
  Administered 2022-05-17: 40 meq via ORAL

## 2022-05-17 NOTE — Progress Notes (Signed)
Nutrition Follow-up  DOCUMENTATION CODES:   Not applicable  INTERVENTION:  - if patient is unable to be extubated, resume TF with Vital 1.5 @ 25 ml/hr to advance by 10 ml every 6 hours to reach goal rate of 65 ml/hr with 45 ml Prosource TF TID.  - at goal rate, this regimen will provide 2460 kcal, 138 grams protein, and 1192 ml free water.   NUTRITION DIAGNOSIS:   Inadequate oral intake related to inability to eat as evidenced by NPO status. -ongoing  GOAL:   Provide needs based on ASPEN/SCCM guidelines [facility]) -unable to meet at this time  MONITOR:   Vent status, Labs, TF tolerance, I & O's  ASSESSMENT:   Pt with hx of EtOH abuse and HTN presented to ED from [facility] with severe bleeding from recently placed trach. Pt emergently intubated in ED and trach removed by ENT.  Patient discussed in rounds this AM. Possible extubation today though he does remain drowsy at this time. Tube feeding on hold in anticipation.   Janina Mayo was removed and replaced with ETT after admission.  Patient has Cortrak in R nare which was placed on 7/25; no abdominal x-ray since that date.   Order in place for Vital AF 1.2 @ 25 ml/hr with 45 ml Prosource TF/day and 30 ml free water every 4 hours.  This regimen provides 760 kcal, 56 grams protein, and 667 ml free water.  Weight with slight fluctuations daily. Weight today is +7 lb compared to weight on 7/23.   Patient is currently intubated on ventilator support MV: 11 L/min Temp (24hrs), Avg:99.3 F (37.4 C), Min:97.7 F (36.5 C), Max:100.9 F (38.3 C) Propofol: none BP: 133/59 and MAP: 79  Labs reviewed; CBGs: 118, 148, 129 mg/dl, Na: 353 mmol/l, BUN: 29 mg/dl, Ca: 8.5 mg/dl.  Medications reviewed; 100 mg colace BID, 1 mg folvite/day, 40 mg IV lasix x1 dose 7/27, sliding scale novolog, 1 tablet multivitamin with minerals/day per tube, 40 mg protonix BID per tube, 17 g miralax/day, 40 mEq Klor-Con x1 dose 7/27, 100 mg IV  thiamine/day started 7/26.  Drip; precedex @ 0.4 mcg/kg/hr.  Diet Order:   Diet Order             Diet NPO time specified  Diet effective now                   EDUCATION NEEDS:   Not appropriate for education at this time  Skin:  Skin Assessment: Skin Integrity Issues: Skin Integrity Issues:: Stage I Stage I: R buttocks (newly documented on 7/23)  Last BM:  7/23  Height:   Ht Readings from Last 1 Encounters:  04/21/2022 6' (1.829 m)    Weight:   Wt Readings from Last 1 Encounters:  05/17/22 107.5 kg     BMI:  Body mass index is 32.14 kg/m.  Estimated Nutritional Needs:  Kcal:  2500-2700 kcal/d Protein:  125-140g/d Fluid:  >/=2.5L/d     Trenton Gammon, MS, RD, LDN, CNSC Registered Dietitian II Inpatient Clinical Nutrition RD pager # and on-call/weekend pager # available in [facility]

## 2022-05-17 NOTE — Progress Notes (Signed)
eLink Physician-Brief Progress Note Patient Name: Spencer Humphrey DOB: 03-12-1966 MRN: [id number]   Date of Service  05/17/2022  HPI/Events of Note  Anemia - Hgb = 7.4 --> 6.6.  eICU Interventions  Will transfuse 1 unit PRBC.     Intervention Category Major Interventions: Other:  [name] Dennard Nip 05/17/2022, 5:08 AM

## 2022-05-17 NOTE — Progress Notes (Signed)
eLink Physician-Brief Progress Note Patient Name: [name] DOB: 12/01/1965 MRN: [id number]   Date of Service  05/17/2022  HPI/Events of Note  56 y/o M with resp failure- was inpt 7/6-7/20 for Active ETOH withdrawal, seizures and had traumatic oral airway, requiring trach placement >> d/c to [facility]. Arrived here 7/23  w/bleeding trach, hypotensive.  CCM orally intubated, removed trach & packed site.   Plan was possible re-do trach, but hoping to give him one more shot at extubation. 7/27 -extubated . Called by bedside RN for agitation despite Precedex max , requesting Dilaudid that he was on at [facility]  eICU Interventions  -seen on camera - currently appears quite sedated  -would monitor for now , caution with opiates given above history and hopeful avoidance of re-do trach  -low dose Dilaudid 0.5mg  IV x1 with close monitoring  -can try IV Zyprexa if he has further issues          Shereda Graw N Winfred Redel 05/17/2022, 11:54 PM

## 2022-05-17 NOTE — Progress Notes (Signed)
Pharmacy Electrolyte Replacement  Recent Labs:  Recent Labs    05/17/22 0434  K 3.5  CREATININE 1.19    Low Critical Values (K </= 2.5, Phos </= 1, Mg </= 1) Present: None  MD Contacted: n/a  Plan: Potassium chloride 40 mEq per tube x1 dose.  Repeat labs in AM.   Link Snuffer, PharmD, BCPS, BCCCP Clinical Pharmacist Please refer to [facility] for [facility] Pharmacy numbers 05/17/2022, 9:43 AM

## 2022-05-17 NOTE — Progress Notes (Addendum)
NAME:  Spencer DELAUGHTER, MRN:  [id number], DOB:  Jul 26, 1966, LOS: 4 ADMISSION DATE:  05/14/2022, CONSULTATION DATE:  05/21/2022 REFERRING MD:  Donnald Garre - EM, CHIEF COMPLAINT:  tracheostomy complication.    History of Present Illness:  56 yo CM PMH etoh abuse, etoh withdrawal seizure, s/p trachostomy, MSSA VAP strep bacteremia who presented to [facility] ED 7/23 via  EMS from [facility] for bleeding trach and associated hypotension.   Admitted to [facility] from 7/6 - 7/20 with alcohol withdrawal seizures, tongue laceration.  Hospital course at [facility] complicated by vent associated pneumonia with MSSA, streptococcal bacteremia. Tracheostomy placed on 7/18 for failure to wean and transferred to [facility]  In ED, required pressors for SBP 60s and transfusion via [place]. R fem CVC placed. ENT consulted. Endotracheally intubated and Trach removed, with no further active bleeding noted.   PCCM consulted for admission in this settng  Pertinent  Medical History  Etoh abuse Alcohol withdrawal seizure   Significant Hospital Events: Including procedures, antibiotic start and stop dates in addition to other pertinent events   7/23 ED from [facility] with trach bleeding. Orally intubated in the ED, ENT consulted 7/25 weaned off levo  Interim History / Subjective:  sedated on propofol/precedex/fentanyl Dressing over stoma Currently on PSV trial 5/5 doing well Hgb 6.6 being transfused 1 unit prbc  Objective   Blood pressure (!) 102/48, pulse 76, temperature (!) 100.9 F (38.3 C), temperature source Core, resp. rate 16, height 6' (1.829 m), weight 107.5 kg, SpO2 95 %.    Vent Mode: PRVC FiO2 (%):  [30 %] 30 % Set Rate:  [12 bmp-16 bmp] 16 bmp Vt Set:  [620 mL] 620 mL PEEP:  [5 cmH20] 5 cmH20 Plateau Pressure:  [13 cmH20-18 cmH20] 14 cmH20   Intake/Output Summary (Last 24 hours) at 05/17/2022 0730 Last data filed at 05/17/2022 0600 Gross per 24 hour  Intake 3008.41 ml  Output 1475 ml  Net  1533.41 ml    Filed Weights   05/15/22 0457 05/16/22 0500 05/17/22 0500  Weight: 108 kg 105.7 kg 107.5 kg    Examination: General:  critically ill appearing on mech vent HEENT: MM pink/moist; ETT in place; stoma with gauze dressing in place Neuro: sedated; nurse states he follows commands when propofol is weaned off; PERRL CV: s1s2, no m/r/g PULM:  dim clear BS bilaterally; on mech vent PRVC GI: soft, bsx4 active  Extremities: warm/dry, no edema  Skin: no rashes or lesions appreciated  Resolved Hospital Problem list     Assessment & Plan:   Acute respiratory failure with hypoxia  VDRF, s/p tracheostomy -- in setting of tongue lac from etoh withdrawal seizure Tracheostomy complication with new bleeding  Risk for chemical pneumonitis in setting of tracheal bleeding  Hx MSSA, streptococcal bacteremia Hx Enterococcus faecalis sinusitis  -trach placed at [facility] 7/18. At [facility] on rounds 7/23 noted to have extensive bleeding  P: -SBT this am -consider extubation if passes -Wean PEEP/FiO2 for SpO2 >92% -VAP bundle in place -PAD protocol in place -wean sedation for RASS goal 0  -continue cefepime -repeat trach culture sent yesterday -ENT following for stoma care  Septic shock, present on admission: improved -? Component of hypovolemic with blood loss but doesn't sound like ongoing bleed was seen by ENT  Strep Bacteremia  -known MSSA PNA and strep bacteremia. Sounds like also some sinusitis and persistent fevers at [facility]. Arrives here with T 100.9 -new onset bleeding from trach  P: -continue cefepime -f/u cultures -trend  wbc/fever curve  Acute encephalopathy -- shock, toxic metabolic  EtOH abuse disorder Recent alcohol withdrawal c/b seizures -1 sz on 7/8. When withdrawal window passed, AEDs weaned and reportedly no further sz activity  -CT head 7/11 No acute findings, MRI brain 7/15 no acute findings, LP obtained and was non-infectious. Started lactulose for  hyperammonemia -at time of discharge, noted to have " improving" mental status but not at baseline.  P: -limit sedating meds -PAD protocol for RASS 0 -thiamine, folic acid, mvi  Anemia Thrombocytopenia P: -hgb 6.6; transfused 1 unit prbc -trend cbc -no signs of bleeding -continue scd's for dvt ppx  Best Practice (right click and "Reselect all SmartList Selections" daily)   Diet/type: tubefeeds DVT prophylaxis: SCD GI prophylaxis: PPI Lines: Central line Foley:  Yes, and it is still needed.  Foley needed to be placed back for urinary retention Code Status:  full code Last date of multidisciplinary goals of care discussion [7/27 spoke with wife and bedside and updated on plan of care]  Critical care time: 35 minutes   JD Anselm Lis [place] [facility] 05/17/2022, 7:30 AM  Please see [email or web address] for pager details.  From 7A-7P if no response, please call [phone]. After hours, please call ELink [phone].

## 2022-05-17 NOTE — Plan of Care (Signed)
  Problem: Education: Goal: Knowledge of General Education information will improve Description: Including pain rating scale, medication(s)/side effects and non-pharmacologic comfort measures Outcome: Not Progressing   Problem: Health Behavior/Discharge Planning: Goal: Ability to manage health-related needs will improve Outcome: Not Progressing   Problem: Clinical Measurements: Goal: Ability to maintain clinical measurements within normal limits will improve Outcome: Not Progressing Goal: Will remain free from infection Outcome: Not Progressing Goal: Diagnostic test results will improve Outcome: Not Progressing   

## 2022-05-17 NOTE — Procedures (Signed)
Extubation Procedure Note  Patient Details:   Name: Spencer Humphrey DOB: 01/17/1966 MRN: [id number]   Airway Documentation:    Vent end date: 05/17/22 Vent end time: 1310   Evaluation  O2 sats: stable throughout Complications: No apparent complications Patient did tolerate procedure well. Bilateral Breath Sounds: Diminished, Rhonchi   Yes Pt was extubated to 4L [place] and is currently stable. No audible cuffleak heard, low VTe leak, MD aware. Pt is able to mouth words and no signs of stridor at this time. Pt was NT suctioned due to increased secretions in upper airway.   Jolayne Panther 05/17/2022, 1:25 PM

## 2022-05-17 NOTE — Progress Notes (Signed)
eLink Physician-Brief Progress Note Patient Name: Spencer Humphrey DOB: 23-Jun-1966 MRN: [id number]   Date of Service  05/17/2022  HPI/Events of Note  Oliguria - Urine output 100 mL for this shift. CVP = 16 from femoral central line. Albumin =  1.9.     eICU Interventions  Plan: 25%  Albumin 12.5 gm IV now.      Intervention Category Major Interventions: Other:  Dmitriy Gair Dennard Nip 05/17/2022, 1:26 AM

## 2022-05-18 ENCOUNTER — Inpatient Hospital Stay (HOSPITAL_COMMUNITY): Payer: Managed Care, Other (non HMO)

## 2022-05-18 DIAGNOSIS — J9500 Unspecified tracheostomy complication: Secondary | ICD-10-CM | POA: Diagnosis not present

## 2022-05-18 DIAGNOSIS — G9341 Metabolic encephalopathy: Secondary | ICD-10-CM

## 2022-05-18 LAB — CBC
HCT: 25.1 % — ABNORMAL LOW (ref 39.0–52.0)
Hemoglobin: 8 g/dL — ABNORMAL LOW (ref 13.0–17.0)
MCH: 31.5 pg (ref 26.0–34.0)
MCHC: 31.9 g/dL (ref 30.0–36.0)
MCV: 98.8 fL (ref 80.0–100.0)
Platelets: 129 10*3/uL — ABNORMAL LOW (ref 150–400)
RBC: 2.54 MIL/uL — ABNORMAL LOW (ref 4.22–5.81)
RDW: 22.3 % — ABNORMAL HIGH (ref 11.5–15.5)
WBC: 7.5 10*3/uL (ref 4.0–10.5)
nRBC: 0 % (ref 0.0–0.2)

## 2022-05-18 LAB — CULTURE, BLOOD (ROUTINE X 2)
Culture: NO GROWTH
Culture: NO GROWTH
Special Requests: ADEQUATE
Special Requests: ADEQUATE

## 2022-05-18 LAB — BASIC METABOLIC PANEL
Anion gap: 8 (ref 5–15)
BUN: 29 mg/dL — ABNORMAL HIGH (ref 6–20)
CO2: 21 mmol/L — ABNORMAL LOW (ref 22–32)
Calcium: 9.2 mg/dL (ref 8.9–10.3)
Chloride: 112 mmol/L — ABNORMAL HIGH (ref 98–111)
Creatinine, Ser: 1.1 mg/dL (ref 0.61–1.24)
GFR, Estimated: >60 mL/min (ref >60)
Glucose, Bld: 149 mg/dL — ABNORMAL HIGH (ref 70–99)
Potassium: 3.5 mmol/L (ref 3.5–5.1)
Sodium: 141 mmol/L (ref 135–145)

## 2022-05-18 LAB — GLUCOSE, CAPILLARY
Glucose-Capillary: 138 mg/dL — ABNORMAL HIGH (ref 70–99)
Glucose-Capillary: 141 mg/dL — ABNORMAL HIGH (ref 70–99)
Glucose-Capillary: 142 mg/dL — ABNORMAL HIGH (ref 70–99)
Glucose-Capillary: 148 mg/dL — ABNORMAL HIGH (ref 70–99)
Glucose-Capillary: 148 mg/dL — ABNORMAL HIGH (ref 70–99)
Glucose-Capillary: 149 mg/dL — ABNORMAL HIGH (ref 70–99)

## 2022-05-18 MED ORDER — ALBUTEROL SULFATE (2.5 MG/3ML) 0.083% IN NEBU
2.5000 mg | INHALATION_SOLUTION | Freq: Four times a day (QID) | RESPIRATORY_TRACT | Status: DC | PRN
  Administered 2022-05-21 – 2022-05-22 (×2): 2.5 mg via RESPIRATORY_TRACT
  Filled 2022-05-18 (×2): qty 3

## 2022-05-18 MED ORDER — ORAL CARE MOUTH RINSE: 15.0000 mL | OROMUCOSAL | Status: DC | PRN

## 2022-05-18 MED ORDER — CHLORDIAZEPOXIDE HCL 5 MG PO CAPS: 25.0000 mg | ORAL_CAPSULE | Freq: Every day | ORAL | Status: DC

## 2022-05-18 MED ORDER — IPRATROPIUM-ALBUTEROL 0.5-2.5 (3) MG/3ML IN SOLN
3.0000 mL | Freq: Four times a day (QID) | RESPIRATORY_TRACT | Status: DC
  Administered 2022-05-18 – 2022-05-19 (×3): 3 mL via RESPIRATORY_TRACT
  Filled 2022-05-18 (×3): qty 3

## 2022-05-18 MED ORDER — CHLORDIAZEPOXIDE HCL 5 MG PO CAPS
25.0000 mg | ORAL_CAPSULE | Freq: Four times a day (QID) | ORAL | Status: AC
  Administered 2022-05-18 (×4): 25 mg
  Filled 2022-05-18 (×4): qty 5

## 2022-05-18 MED ORDER — CHLORDIAZEPOXIDE HCL 5 MG PO CAPS
25.0000 mg | ORAL_CAPSULE | Freq: Three times a day (TID) | ORAL | Status: AC
  Administered 2022-05-19 (×3): 25 mg
  Filled 2022-05-18 (×3): qty 5

## 2022-05-18 MED ORDER — SODIUM CHLORIDE 3 % IN NEBU
4.0000 mL | INHALATION_SOLUTION | Freq: Two times a day (BID) | RESPIRATORY_TRACT | Status: DC
  Administered 2022-05-18 – 2022-05-19 (×2): 4 mL via RESPIRATORY_TRACT
  Filled 2022-05-18 (×2): qty 4

## 2022-05-18 MED ORDER — THIAMINE HCL 100 MG PO TABS
100.0000 mg | ORAL_TABLET | Freq: Every day | ORAL | Status: DC
  Administered 2022-05-18 – 2022-05-21 (×4): 100 mg
  Filled 2022-05-18 (×4): qty 1

## 2022-05-18 MED ORDER — ORAL CARE MOUTH RINSE
15.0000 mL | OROMUCOSAL | Status: DC
  Administered 2022-05-18 – 2022-05-19 (×6): 15 mL via OROMUCOSAL

## 2022-05-18 MED ORDER — ALBUTEROL SULFATE (2.5 MG/3ML) 0.083% IN NEBU: 2.5000 mg | INHALATION_SOLUTION | Freq: Two times a day (BID) | RESPIRATORY_TRACT | Status: DC

## 2022-05-18 MED ORDER — CHLORDIAZEPOXIDE HCL 5 MG PO CAPS: 25.0000 mg | ORAL_CAPSULE | ORAL | Status: DC

## 2022-05-18 MED ORDER — CHLORDIAZEPOXIDE HCL 5 MG PO CAPS: 25.0000 mg | ORAL_CAPSULE | Freq: Four times a day (QID) | ORAL | Status: DC | PRN

## 2022-05-18 MED ORDER — HYDROXYZINE HCL 25 MG PO TABS
25.0000 mg | ORAL_TABLET | Freq: Four times a day (QID) | ORAL | Status: DC | PRN
  Administered 2022-05-18: 25 mg
  Filled 2022-05-18: qty 1

## 2022-05-18 MED ORDER — FREE WATER
200.0000 mL | Freq: Three times a day (TID) | Status: DC
  Administered 2022-05-18 – 2022-05-22 (×7): 200 mL

## 2022-05-18 MED ORDER — IPRATROPIUM-ALBUTEROL 0.5-2.5 (3) MG/3ML IN SOLN
RESPIRATORY_TRACT | Status: AC
  Administered 2022-05-18: 3 mL
  Filled 2022-05-18: qty 3

## 2022-05-18 NOTE — Progress Notes (Signed)
MD notified of pt's labored breathing and diaphoresis, along with agitation and delirium despite previous intervention.

## 2022-05-18 NOTE — Plan of Care (Signed)
  Problem: Nutrition: Goal: Adequate nutrition will be maintained Outcome: Progressing   Problem: Education: Goal: Knowledge of General Education information will improve Description: Including pain rating scale, medication(s)/side effects and non-pharmacologic comfort measures Outcome: Not Progressing   Problem: Health Behavior/Discharge Planning: Goal: Ability to manage health-related needs will improve Outcome: Not Progressing   Problem: Clinical Measurements: Goal: Ability to maintain clinical measurements within normal limits will improve Outcome: Not Progressing Goal: Will remain free from infection Outcome: Not Progressing Goal: Diagnostic test results will improve Outcome: Not Progressing Goal: Respiratory complications will improve Outcome: Not Progressing   Problem: Coping: Goal: Level of anxiety will decrease Outcome: Not Progressing   Problem: Elimination: Goal: Will not experience complications related to bowel motility Outcome: Not Progressing Goal: Will not experience complications related to urinary retention Outcome: Not Progressing

## 2022-05-18 NOTE — Progress Notes (Signed)
Scheduled librium due

## 2022-05-18 NOTE — Progress Notes (Signed)
Patient successfully extubated.  Trach wound examined.  Good granulation tissue.  Large wound, not ready to be reapproximated with Steri-Strips.  Recommend wet-to-dry dressing changes twice daily.  We will check again in 1 week either in the hospital or in the office.

## 2022-05-18 NOTE — Progress Notes (Signed)
PCCM Update:  Called to bedside by nursing concerned about his respiratory status. He was alert and following commands. Intermittently agitated which led to increased respiratory rate. He received NT suctioning prior to my arrival and had strong productive sounding cough which I provided oral suction with removal of thick pinkish secretions. He had decreased breath sounds on right. No stridor appreciated. He was given duoneb treatment and seemed to settle his breathing a bit.   He has a stiff neck, right ward leaning which is reportedly new over the last couple of days. We will discontinue seroquel for concern of extrapyramidal side effects.   We will continue to monitor his respiratory status closely.   Melody Comas, MD [place] [facility] Office: [phone]   See Amion for personal pager PCCM on call pager [phone] until 7pm. Please call Elink 7p-7a. [phone]

## 2022-05-18 NOTE — Progress Notes (Signed)
Patient NTS at this time. A copious amount of think frank red blood was obtained. MD and RN notified. Order to continue with nebs and start CPT.

## 2022-05-18 NOTE — Progress Notes (Signed)
OT Cancellation Note  Patient Details Name: MACK ALVIDREZ MRN: [id number] DOB: [birth year]   Cancelled Treatment:    Reason Eval/Treat Not Completed: Medical issues which prohibited therapy. RN requesting hold due to pt delirium and agitation. OT will follow up as pt is medically able to participate in therapy.   Marites Nath Elane Tramane Gorum 05/18/2022, 11:51 AM

## 2022-05-18 NOTE — Consult Note (Signed)
WOC Nurse Consult Note: Patient receiving care in [facility] 3M09. Primary RN, Colin Mulders, present at time of my assessment. She explained that ENT is supposed to come by today to evaluate the trach wound. Reason for Consult: trach wound and sacral wound Wound type: My understanding is that someone from the ENT group is to come by today and render care instructions for the trach area. There are 3 distinct DTPIs to the buttocks area. Pressure Injury POA: No Measurement: The left buttock DTPI that is purple in color measures 5 cm x 5 cm.  The right buttock purple DTPI measures 8 cm x 4 cm. There is a purple DTPI starting on the coccyx. It measures 1 cm x 0.3 cm There is a foam dressing covering all the areas. Wound bed: Drainage (amount, consistency, odor) none Periwound: intact Dressing procedure/placement/frequency: Turn right or left. Avoid pressure to the coccyx/buttocks, he has pressure injuries there.  Continue the use of the foam dressing to cover the areas.  I have placed the patient on the WOC f/u list for re-evaluation next week.  Monitor the wound area(s) for worsening of condition such as: Signs/symptoms of infection,  Increase in size,  Development of or worsening of odor, Development of pain, or increased pain at the affected locations.  Notify the medical team if any of these develop.  Helmut Muster, RN, MSN, CWOCN, CNS-BC, pager [phone]

## 2022-05-18 NOTE — Progress Notes (Signed)
PT Cancellation Note  Patient Details Name: Spencer Humphrey MRN: [id number] DOB: 02-23-1966   Cancelled Treatment:    Reason Eval/Treat Not Completed: Patient not medically ready.  RN requesting hold due to pt delirium and agitation. PT will follow up as pt is medically able to participate in therapy.   05/18/2022  Jacinto Halim., PT Acute Rehabilitation Services [phone]  (pager) [phone]  (office)     Eliseo Gum Iliza Blankenbeckler 05/18/2022, 12:45 PM

## 2022-05-18 NOTE — Progress Notes (Addendum)
NAME:  Spencer Humphrey, MRN:  [id number], DOB:  09-23-1966, LOS: 5 ADMISSION DATE:  2022-06-08, CONSULTATION DATE:  06/08/22 REFERRING MD:  Donnald Garre - EM, CHIEF COMPLAINT:  tracheostomy complication.    History of Present Illness:  56 yo CM PMH etoh abuse, etoh withdrawal seizure, s/p trachostomy, MSSA VAP strep bacteremia who presented to [facility] ED 7/23 via  EMS from [facility] for bleeding trach and associated hypotension.   Admitted to [facility] from 7/6 - 7/20 with alcohol withdrawal seizures, tongue laceration.  Hospital course at [facility] complicated by vent associated pneumonia with MSSA, streptococcal bacteremia. Tracheostomy placed on 7/18 for failure to wean and transferred to [facility]  In ED, required pressors for SBP 60s and transfusion via belmont. R fem CVC placed. ENT consulted. Endotracheally intubated and Trach removed, with no further active bleeding noted.   PCCM consulted for admission in this settng  Pertinent  Medical History  Etoh abuse Alcohol withdrawal seizure   Significant Hospital Events: Including procedures, antibiotic start and stop dates in addition to other pertinent events   7/23 ED from [facility] with trach bleeding. Orally intubated in the ED, ENT consulted 7/25 weaned off levo 7/27 extubated, precedex started and up titrated  7/28 very agitated started libirum taper   Interim History / Subjective:  Very agitated   Objective   Blood pressure 113/73, pulse 72, temperature 97.8 F (36.6 C), temperature source Axillary, resp. rate (Abnormal) 28, height 6' (1.829 m), weight 107.5 kg, SpO2 96 %.    Vent Mode: PSV;CPAP FiO2 (%):  [30 %] 30 % PEEP:  [5 cmH20] 5 cmH20 Pressure Support:  [5 cmH20] 5 cmH20   Intake/Output Summary (Last 24 hours) at 05/18/2022 0847 Last data filed at 05/18/2022 0600 Gross per 24 hour  Intake 1345.81 ml  Output 2200 ml  Net -854.19 ml   Filed Weights   05/15/22 0457 05/16/22 0500 05/17/22 0500  Weight: 108 kg  105.7 kg 107.5 kg    Examination:  General agitated 56 year old male restless in bed HENT MM dry. No JVD tongue try w/ dried secretions, trach stoma dressed, stoma site eroded w/ good granulation. Still phonation weak Pulm scattered rhonchi  Card rrr Abd soft  Ext warm and dry  Neuro restless moves all ext GU cl yellow   Resolved Hospital Problem list   Mssa PNA Strep bacteremia  Septic shock  Assessment & Plan:   Acute respiratory failure with hypoxia w/ VDRF, s/p tracheostomy -- in setting of tongue lac from etoh withdrawal seizure Tracheostomy complication (looks like stoma eroded)  with new bleeding  c/b Aspiration PNA  -trach placed at [facility] 7/18. At [facility] on rounds 7/23 noted to have extensive bleeding; successfully extubated 7/27 Plan NPO Pulse ox Wean oxygen Stop date for PNA planned for 7/29 PRN CXR (will get f/u in am)  H/o Strep Bacteremia - Sounds like also some sinusitis and persistent fevers at [facility]. Arrives here with T 100.9 (was to complete ancef 7/28) Plan Cont cefepime thru today then stop   Acute encephalopathy -- shock, toxic metabolic  EtOH abuse disorder Recent alcohol withdrawal c/b seizures -1 sz on 7/8. When withdrawal window passed, AEDs weaned and reportedly no further sz activity  -CT head 7/11 No acute findings, MRI brain 7/15 no acute findings, LP obtained and was non-infectious. Started lactulose for hyperammonemia -at time of discharge, noted to have " improving" mental status but not at baseline.  Plan Supportive care Cont thiamine, folate and MVI  Adding librium taper Try to wean off precedex Cont Seroquel at current   Urinary retention completed Urecholine Plan Remove foley now Bladder scan q shift  Difficult IV access.  -has femoral line  Plan IV to assess   Anemia Thrombocytopenia -transfused 7/27; hgb stable today as are PLTs stable  Plan Trend cbc Trigger for transfusion < 7 SCDs   Best Practice (right  click and "Reselect all SmartList Selections" daily)   Diet/type: tubefeeds DVT prophylaxis: SCD GI prophylaxis: PPI Lines: Central line Foley:  Yes, and it is no longer needed and removal ordered .  Foley needed to be placed back for urinary retention Code Status:  full code Last date of multidisciplinary goals of care discussion [7/27 spoke with wife and bedside and updated on plan of care]  Critical care time: 32 min  Simonne Martinet ACNP-BC [facility] Pulmonary/Critical Care Pager # [phone] OR # [phone] if no answer

## 2022-05-19 ENCOUNTER — Inpatient Hospital Stay (HOSPITAL_COMMUNITY): Payer: Managed Care, Other (non HMO)

## 2022-05-19 DIAGNOSIS — R579 Shock, unspecified: Secondary | ICD-10-CM

## 2022-05-19 DIAGNOSIS — G9341 Metabolic encephalopathy: Secondary | ICD-10-CM

## 2022-05-19 DIAGNOSIS — J9500 Unspecified tracheostomy complication: Secondary | ICD-10-CM | POA: Diagnosis not present

## 2022-05-19 DIAGNOSIS — R569 Unspecified convulsions: Secondary | ICD-10-CM

## 2022-05-19 DIAGNOSIS — J9501 Hemorrhage from tracheostomy stoma: Secondary | ICD-10-CM | POA: Diagnosis not present

## 2022-05-19 LAB — BASIC METABOLIC PANEL
Anion gap: 16 — ABNORMAL HIGH (ref 5–15)
BUN: 24 mg/dL — ABNORMAL HIGH (ref 6–20)
CO2: 16 mmol/L — ABNORMAL LOW (ref 22–32)
Calcium: 8.7 mg/dL — ABNORMAL LOW (ref 8.9–10.3)
Chloride: 113 mmol/L — ABNORMAL HIGH (ref 98–111)
Creatinine, Ser: 1.3 mg/dL — ABNORMAL HIGH (ref 0.61–1.24)
GFR, Estimated: >60 mL/min (ref >60)
Glucose, Bld: 242 mg/dL — ABNORMAL HIGH (ref 70–99)
Potassium: 3.8 mmol/L (ref 3.5–5.1)
Sodium: 145 mmol/L (ref 135–145)

## 2022-05-19 LAB — POCT I-STAT 7, (LYTES, BLD GAS, ICA,H+H)
Acid-Base Excess: 1 mmol/L (ref 0.0–2.0)
Acid-Base Excess: 1 mmol/L (ref 0.0–2.0)
Acid-base deficit: 10 mmol/L — ABNORMAL HIGH (ref 0.0–2.0)
Acid-base deficit: 2 mmol/L (ref 0.0–2.0)
Bicarbonate: 19.8 mmol/L — ABNORMAL LOW (ref 20.0–28.0)
Bicarbonate: 22.1 mmol/L (ref 20.0–28.0)
Bicarbonate: 23.9 mmol/L (ref 20.0–28.0)
Bicarbonate: 25.3 mmol/L (ref 20.0–28.0)
Calcium, Ion: 1.2 mmol/L (ref 1.15–1.40)
Calcium, Ion: 1.21 mmol/L (ref 1.15–1.40)
Calcium, Ion: 1.26 mmol/L (ref 1.15–1.40)
Calcium, Ion: 1.26 mmol/L (ref 1.15–1.40)
HCT: 27 % — ABNORMAL LOW (ref 39.0–52.0)
HCT: 26 % — ABNORMAL LOW (ref 39.0–52.0)
HCT: 23 % — ABNORMAL LOW (ref 39.0–52.0)
HCT: 28 % — ABNORMAL LOW (ref 39.0–52.0)
Hemoglobin: 9.2 g/dL — ABNORMAL LOW (ref 13.0–17.0)
Hemoglobin: 8.8 g/dL — ABNORMAL LOW (ref 13.0–17.0)
Hemoglobin: 7.8 g/dL — ABNORMAL LOW (ref 13.0–17.0)
Hemoglobin: 9.5 g/dL — ABNORMAL LOW (ref 13.0–17.0)
O2 Saturation: 100 %
O2 Saturation: 96 %
O2 Saturation: 92 %
O2 Saturation: 94 %
Patient temperature: 98.6
Patient temperature: 37.2
Patient temperature: 37.3
Patient temperature: 36.3
Potassium: 3.7 mmol/L (ref 3.5–5.1)
Potassium: 3.3 mmol/L — ABNORMAL LOW (ref 3.5–5.1)
Potassium: 3.7 mmol/L (ref 3.5–5.1)
Potassium: 4.2 mmol/L (ref 3.5–5.1)
Sodium: 145 mmol/L (ref 135–145)
Sodium: 146 mmol/L — ABNORMAL HIGH (ref 135–145)
Sodium: 146 mmol/L — ABNORMAL HIGH (ref 135–145)
Sodium: 146 mmol/L — ABNORMAL HIGH (ref 135–145)
TCO2: 22 mmol/L (ref 22–32)
TCO2: 23 mmol/L (ref 22–32)
TCO2: 25 mmol/L (ref 22–32)
TCO2: 26 mmol/L (ref 22–32)
pCO2 arterial: 69 mmHg (ref 32–48)
pCO2 arterial: 34.2 mmHg (ref 32–48)
pCO2 arterial: 31.5 mmHg — ABNORMAL LOW (ref 32–48)
pCO2 arterial: 34.9 mmHg (ref 32–48)
pH, Arterial: 7.067 — CL (ref 7.35–7.45)
pH, Arterial: 7.419 (ref 7.35–7.45)
pH, Arterial: 7.489 — ABNORMAL HIGH (ref 7.35–7.45)
pH, Arterial: 7.465 — ABNORMAL HIGH (ref 7.35–7.45)
pO2, Arterial: 529 mmHg — ABNORMAL HIGH (ref 83–108)
pO2, Arterial: 81 mmHg — ABNORMAL LOW (ref 83–108)
pO2, Arterial: 59 mmHg — ABNORMAL LOW (ref 83–108)
pO2, Arterial: 65 mmHg — ABNORMAL LOW (ref 83–108)

## 2022-05-19 LAB — COMPREHENSIVE METABOLIC PANEL
ALT: 29 U/L (ref 0–44)
ALT: 33 U/L (ref 0–44)
AST: 75 U/L — ABNORMAL HIGH (ref 15–41)
AST: 85 U/L — ABNORMAL HIGH (ref 15–41)
Albumin: 2 g/dL — ABNORMAL LOW (ref 3.5–5.0)
Albumin: 2 g/dL — ABNORMAL LOW (ref 3.5–5.0)
Alkaline Phosphatase: 152 U/L — ABNORMAL HIGH (ref 38–126)
Alkaline Phosphatase: 136 U/L — ABNORMAL HIGH (ref 38–126)
Anion gap: 6 (ref 5–15)
Anion gap: 10 (ref 5–15)
BUN: 22 mg/dL — ABNORMAL HIGH (ref 6–20)
BUN: 25 mg/dL — ABNORMAL HIGH (ref 6–20)
CO2: 22 mmol/L (ref 22–32)
CO2: 23 mmol/L (ref 22–32)
Calcium: 9.2 mg/dL (ref 8.9–10.3)
Calcium: 8.8 mg/dL — ABNORMAL LOW (ref 8.9–10.3)
Chloride: 115 mmol/L — ABNORMAL HIGH (ref 98–111)
Chloride: 112 mmol/L — ABNORMAL HIGH (ref 98–111)
Creatinine, Ser: 0.88 mg/dL (ref 0.61–1.24)
Creatinine, Ser: 1.2 mg/dL (ref 0.61–1.24)
GFR, Estimated: >60 mL/min (ref >60)
GFR, Estimated: >60 mL/min (ref >60)
Glucose, Bld: 150 mg/dL — ABNORMAL HIGH (ref 70–99)
Glucose, Bld: 199 mg/dL — ABNORMAL HIGH (ref 70–99)
Potassium: 3.6 mmol/L (ref 3.5–5.1)
Potassium: 3.3 mmol/L — ABNORMAL LOW (ref 3.5–5.1)
Sodium: 143 mmol/L (ref 135–145)
Sodium: 145 mmol/L (ref 135–145)
Total Bilirubin: 1.4 mg/dL — ABNORMAL HIGH (ref 0.3–1.2)
Total Bilirubin: 1.2 mg/dL (ref 0.3–1.2)
Total Protein: 6.1 g/dL — ABNORMAL LOW (ref 6.5–8.1)
Total Protein: 5.9 g/dL — ABNORMAL LOW (ref 6.5–8.1)

## 2022-05-19 LAB — CBC
HCT: 26.6 % — ABNORMAL LOW (ref 39.0–52.0)
HCT: 29.5 % — ABNORMAL LOW (ref 39.0–52.0)
Hemoglobin: 8.5 g/dL — ABNORMAL LOW (ref 13.0–17.0)
Hemoglobin: 9.7 g/dL — ABNORMAL LOW (ref 13.0–17.0)
MCH: 31.5 pg (ref 26.0–34.0)
MCH: 30.8 pg (ref 26.0–34.0)
MCHC: 32 g/dL (ref 30.0–36.0)
MCHC: 32.9 g/dL (ref 30.0–36.0)
MCV: 98.5 fL (ref 80.0–100.0)
MCV: 93.7 fL (ref 80.0–100.0)
Platelets: 158 10*3/uL (ref 150–400)
Platelets: 175 10*3/uL (ref 150–400)
RBC: 2.7 MIL/uL — ABNORMAL LOW (ref 4.22–5.81)
RBC: 3.15 MIL/uL — ABNORMAL LOW (ref 4.22–5.81)
RDW: 21.9 % — ABNORMAL HIGH (ref 11.5–15.5)
RDW: 22.3 % — ABNORMAL HIGH (ref 11.5–15.5)
WBC: 7.6 10*3/uL (ref 4.0–10.5)
WBC: 14.2 10*3/uL — ABNORMAL HIGH (ref 4.0–10.5)
nRBC: 0 % (ref 0.0–0.2)
nRBC: 0.1 % (ref 0.0–0.2)

## 2022-05-19 LAB — APTT: aPTT: 37 seconds — ABNORMAL HIGH (ref 24–36)

## 2022-05-19 LAB — LACTIC ACID, PLASMA
Lactic Acid, Venous: >9 mmol/L (ref 0.5–1.9)
Lactic Acid, Venous: 2.5 mmol/L (ref 0.5–1.9)
Lactic Acid, Venous: 3.3 mmol/L (ref 0.5–1.9)
Lactic Acid, Venous: 1.6 mmol/L (ref 0.5–1.9)

## 2022-05-19 LAB — PROTIME-INR
INR: 1.7 — ABNORMAL HIGH (ref 0.8–1.2)
Prothrombin Time: 20.1 seconds — ABNORMAL HIGH (ref 11.4–15.2)

## 2022-05-19 LAB — GLUCOSE, CAPILLARY
Glucose-Capillary: 124 mg/dL — ABNORMAL HIGH (ref 70–99)
Glucose-Capillary: 137 mg/dL — ABNORMAL HIGH (ref 70–99)
Glucose-Capillary: 140 mg/dL — ABNORMAL HIGH (ref 70–99)
Glucose-Capillary: 155 mg/dL — ABNORMAL HIGH (ref 70–99)
Glucose-Capillary: 185 mg/dL — ABNORMAL HIGH (ref 70–99)

## 2022-05-19 LAB — CULTURE, RESPIRATORY W GRAM STAIN: Culture: NORMAL

## 2022-05-19 LAB — TROPONIN I (HIGH SENSITIVITY)
Troponin I (High Sensitivity): 45 ng/L — ABNORMAL HIGH (ref <18)
Troponin I (High Sensitivity): 116 ng/L (ref <18)

## 2022-05-19 LAB — MAGNESIUM: Magnesium: 2.1 mg/dL (ref 1.7–2.4)

## 2022-05-19 LAB — PREPARE RBC (CROSSMATCH)

## 2022-05-19 MED ORDER — LEVETIRACETAM IN NACL 1000 MG/100ML IV SOLN
1000.0000 mg | Freq: Two times a day (BID) | INTRAVENOUS | Status: DC
  Administered 2022-05-19 – 2022-05-22 (×6): 1000 mg via INTRAVENOUS
  Filled 2022-05-19 (×6): qty 100

## 2022-05-19 MED ORDER — TRANEXAMIC ACID-NACL 1000-0.7 MG/100ML-% IV SOLN: 1000.0000 mg | Freq: Once | INTRAVENOUS | Status: DC

## 2022-05-19 MED ORDER — ORAL CARE MOUTH RINSE: 15.0000 mL | OROMUCOSAL | Status: DC | PRN

## 2022-05-19 MED ORDER — FENTANYL CITRATE PF 50 MCG/ML IJ SOSY: 50.0000 ug | PREFILLED_SYRINGE | INTRAMUSCULAR | Status: DC | PRN

## 2022-05-19 MED ORDER — ROCURONIUM BROMIDE 50 MG/5ML IV SOLN: 50.0000 mg | Freq: Once | INTRAVENOUS | Status: AC

## 2022-05-19 MED ORDER — SODIUM CHLORIDE 0.9 % IV SOLN
3.0000 g | Freq: Four times a day (QID) | INTRAVENOUS | Status: DC
  Administered 2022-05-19 – 2022-05-22 (×10): 3 g via INTRAVENOUS
  Filled 2022-05-19 (×11): qty 8

## 2022-05-19 MED ORDER — FENTANYL 2500MCG IN NS 250ML (10MCG/ML) PREMIX INFUSION: 50.0000 ug/h | INTRAVENOUS | Status: DC

## 2022-05-19 MED ORDER — PROPOFOL 1000 MG/100ML IV EMUL: 0.0000 ug/kg/min | INTRAVENOUS | Status: DC

## 2022-05-19 MED ORDER — LACTATED RINGERS IV BOLUS: 1000.0000 mL | Freq: Once | INTRAVENOUS | Status: AC

## 2022-05-19 MED ORDER — ROCURONIUM BROMIDE 10 MG/ML (PF) SYRINGE
PREFILLED_SYRINGE | INTRAVENOUS | Status: AC
  Administered 2022-05-19: 50 mg via INTRAVENOUS
  Filled 2022-05-19: qty 10

## 2022-05-19 MED ORDER — FENTANYL CITRATE PF 50 MCG/ML IJ SOSY
50.0000 ug | PREFILLED_SYRINGE | INTRAMUSCULAR | Status: DC | PRN
  Administered 2022-05-20: 50 ug via INTRAVENOUS
  Filled 2022-05-19: qty 1

## 2022-05-19 MED ORDER — NOREPINEPHRINE 4 MG/250ML-% IV SOLN
0.0000 ug/min | INTRAVENOUS | Status: DC
  Administered 2022-05-19: 50 ug/min via INTRAVENOUS
  Administered 2022-05-19: 5 ug/min via INTRAVENOUS
  Filled 2022-05-19 (×2): qty 250

## 2022-05-19 MED ORDER — TRANEXAMIC ACID FOR INHALATION
500.0000 mg | Freq: Once | RESPIRATORY_TRACT | Status: AC
Start: 2022-05-19 — End: 2022-05-19
  Administered 2022-05-19: 500 mg via RESPIRATORY_TRACT
  Filled 2022-05-19: qty 10

## 2022-05-19 MED ORDER — DOCUSATE SODIUM 50 MG/5ML PO LIQD
100.0000 mg | Freq: Two times a day (BID) | ORAL | Status: DC
  Administered 2022-05-19 – 2022-05-21 (×6): 100 mg
  Filled 2022-05-19 (×6): qty 10

## 2022-05-19 MED ORDER — PROPOFOL 1000 MG/100ML IV EMUL
0.0000 ug/kg/min | INTRAVENOUS | Status: DC
  Administered 2022-05-19: 15 ug/kg/min via INTRAVENOUS
  Administered 2022-05-19: 25 ug/kg/min via INTRAVENOUS
  Administered 2022-05-19: 5 ug/kg/min via INTRAVENOUS
  Administered 2022-05-20: 15 ug/kg/min via INTRAVENOUS
  Administered 2022-05-20: 20 ug/kg/min via INTRAVENOUS
  Filled 2022-05-19 (×6): qty 100

## 2022-05-19 MED ORDER — LEVETIRACETAM IN NACL 1500 MG/100ML IV SOLN
1500.0000 mg | Freq: Once | INTRAVENOUS | Status: AC
  Administered 2022-05-19: 1500 mg via INTRAVENOUS
  Filled 2022-05-19: qty 100

## 2022-05-19 MED ORDER — LACTATED RINGERS IV BOLUS
1000.0000 mL | Freq: Once | INTRAVENOUS | Status: AC
  Administered 2022-05-19: 1000 mL via INTRAVENOUS

## 2022-05-19 MED ORDER — POTASSIUM CHLORIDE 10 MEQ/50ML IV SOLN
10.0000 meq | INTRAVENOUS | Status: AC
  Administered 2022-05-19 (×4): 10 meq via INTRAVENOUS
  Filled 2022-05-19 (×4): qty 50

## 2022-05-19 MED ORDER — POLYETHYLENE GLYCOL 3350 17 G PO PACK
17.0000 g | PACK | Freq: Every day | ORAL | Status: DC
  Administered 2022-05-19 – 2022-05-21 (×3): 17 g
  Filled 2022-05-19 (×3): qty 1

## 2022-05-19 MED ORDER — VASOPRESSIN 20 UNITS/100 ML INFUSION FOR SHOCK
0.0000 [IU]/min | INTRAVENOUS | Status: DC
  Administered 2022-05-19: 0.03 [IU]/min via INTRAVENOUS

## 2022-05-19 MED ORDER — FENTANYL CITRATE PF 50 MCG/ML IJ SOSY: 50.0000 ug | PREFILLED_SYRINGE | Freq: Once | INTRAMUSCULAR | Status: DC

## 2022-05-19 MED ORDER — IOHEXOL 350 MG/ML SOLN
100.0000 mL | Freq: Once | INTRAVENOUS | Status: AC | PRN
  Administered 2022-05-19: 100 mL via INTRAVENOUS

## 2022-05-19 MED ORDER — ORAL CARE MOUTH RINSE
15.0000 mL | OROMUCOSAL | Status: DC
  Administered 2022-05-19 – 2022-05-22 (×32): 15 mL via OROMUCOSAL

## 2022-05-19 MED ORDER — SODIUM CHLORIDE 0.9% IV SOLUTION: Freq: Once | INTRAVENOUS | Status: AC

## 2022-05-19 MED ORDER — FENTANYL BOLUS VIA INFUSION: 50.0000 ug | INTRAVENOUS | Status: DC | PRN

## 2022-05-19 MED ORDER — MIDAZOLAM HCL 2 MG/2ML IJ SOLN
4.0000 mg | INTRAMUSCULAR | Status: DC | PRN
Start: 2022-05-19 — End: 2022-05-22
  Administered 2022-05-19 – 2022-05-20 (×2): 4 mg via INTRAVENOUS
  Filled 2022-05-19 (×2): qty 4

## 2022-05-19 MED ORDER — BUSPIRONE HCL 15 MG PO TABS: 30.0000 mg | ORAL_TABLET | Freq: Three times a day (TID) | ORAL | Status: AC | PRN

## 2022-05-19 MED ORDER — EPINEPHRINE HCL 5 MG/250ML IV SOLN IN NS
0.5000 ug/min | INTRAVENOUS | Status: DC
  Administered 2022-05-19: 20 ug/min via INTRAVENOUS

## 2022-05-19 MED ORDER — TRANEXAMIC ACID FOR EPISTAXIS: 500.0000 mg | Freq: Every day | TOPICAL | Status: DC | PRN

## 2022-05-19 MED ORDER — RACEPINEPHRINE HCL 2.25 % IN NEBU: 0.5000 mL | INHALATION_SOLUTION | RESPIRATORY_TRACT | Status: DC | PRN

## 2022-05-19 NOTE — Progress Notes (Signed)
PT Cancellation Note  Patient Details Name: THAXTON PELLEY MRN: [id number] DOB: 05-07-1966   Cancelled Treatment:    Reason Eval/Treat Not Completed: Patient not medically ready. Code called early this morning. PEA requiring 23 minutes of CPR. Unable to intubate and required emergent trach. Pt now sedated on vent support. PT to follow and proceed with eval when medically ready.   Ilda Foil 05/19/2022, 7:26 AM

## 2022-05-19 NOTE — Progress Notes (Signed)
NAME:  Spencer Humphrey, MRN:  [id number], DOB:  1966-01-03, LOS: 6 ADMISSION DATE:  04/28/2022, CONSULTATION DATE:  05/11/2022 REFERRING MD:  Donnald Garre - EM, CHIEF COMPLAINT:  tracheostomy complication.    History of Present Illness:  56 yo CM PMH etoh abuse, etoh withdrawal seizure, s/p trachostomy, MSSA VAP strep bacteremia who presented to [facility] ED 7/23 via  EMS from [facility] for bleeding trach and associated hypotension.   Admitted to [facility] from 7/6 - 7/20 with alcohol withdrawal seizures, tongue laceration.  Hospital course at [facility] complicated by vent associated pneumonia with MSSA, streptococcal bacteremia. Tracheostomy placed on 7/18 for failure to wean and transferred to [facility]  In ED, required pressors for SBP 60s and transfusion via belmont. R fem CVC placed. ENT consulted. Endotracheally intubated and Trach removed, with no further active bleeding noted.   PCCM consulted for admission in this settng  Pertinent  Medical History  Etoh abuse Alcohol withdrawal seizure   Significant Hospital Events: Including procedures, antibiotic start and stop dates in addition to other pertinent events   7/23 ED from [facility] with trach bleeding. Orally intubated in the ED, ENT consulted 7/25 weaned off levo 7/27 extubated, precedex started and up titrated  7/28 very agitated started libirum taper   Interim History / Subjective:   Patient had PEA arrest after coughing up large blood clots from stoma He was not able to be orally intubated and had a tracheostomy replaced via his stoma.  23 minutes of CPR until ROSC achieved He developed posturing and seizure activity with confirmed seizure activity on EEG, CT Head has been ordered. Propofol has been started for seizure suppression He was placed on epi, levo, vaso   Objective   Blood pressure 134/66, pulse 73, temperature 99.1 F (37.3 C), temperature source Esophageal, resp. rate (!) 28, height 6' (1.829 m), weight 107.5  kg, SpO2 100 %.    Vent Mode: PRVC FiO2 (%):  [50 %-100 %] 50 % Set Rate:  [20 bmp-28 bmp] 28 bmp Vt Set:  [921 mL] 620 mL PEEP:  [5 cmH20-8 cmH20] 5 cmH20 Plateau Pressure:  [34 cmH20] 34 cmH20   Intake/Output Summary (Last 24 hours) at 05/19/2022 0719 Last data filed at 05/19/2022 1941 Gross per 24 hour  Intake 4069.96 ml  Output 800 ml  Net 3269.96 ml   Filed Weights   05/15/22 0457 05/16/22 0500 05/17/22 0500  Weight: 108 kg 105.7 kg 107.5 kg    Examination:  General: sedated, intubated, cEEG in place, no acute distress  HENT: tracheostomy inplace, bloody dressing surround tracheostomy site  Pulm: course breath sounds on right, no wheezing Card: RRR, no murmurs Abd: soft, non-distended, BS+ Ext: warm, trace RLE edema Neuro: sedated, opened eyes spontaneously GU: foley in place  Resolved Hospital Problem list   Mssa PNA Strep bacteremia  Septic shock  H/o Strep Bacteremia  Assessment & Plan:   PEA Cardiac Arrest Shock Lactic Acidosis 7/29 re-bleed at tracheostomy site with respiratory arrest leading to PEA cardiac arrest.  - supportive care - continue vasopressor support, wean for MAP 65 or greater - trend lactic acid  Acute respiratory failure with hypoxia w/ VDRF, s/p tracheostomy -- in setting of tongue lac from etoh withdrawal seizure Tracheostomy complication (looks like stoma eroded) with new bleeding  c/b Aspiration PNA  -trach placed at [facility] 7/18. At [facility] on rounds 7/23 noted to have extensive bleeding; successfully extubated 7/27; trach replaced 7/29 during PEA cardiac arrest due to bleeding at trach  site again - Continue ventilator support - will re-consult ENT for further evaluation of the tracheostomy site - CTA Neck and Chest ordered - Cefepime completed today  Anoxic Brain Injury Acute encephalopathy - shock, toxic metabolic  EtOH abuse disorder Recent alcohol withdrawal c/b seizures Hx of seizure disorder -1 sz on 7/8. When withdrawal  window passed, AEDs weaned and reportedly no further sz activity  -CT head 7/11 No acute findings, MRI brain 7/15 no acute findings, LP obtained and was non-infectious. Started lactulose for hyperammonemia -at time of discharge, noted to have " improving" mental status but not at baseline.  - Supportive care - Cont thiamine, folate and MVI - Neurology following - Continue keppra - CT head ordered - cEEG in place  Acute Kidney Injury Hypokalemia - monitor Cr and UOP - replete potassium  Urinary retention - foley in place  Anemia Thrombocytopenia -transfused 7/27; hgb stable today as are PLTs stable  Plan Trend cbc Trigger for transfusion < 7 SCDs  Protein Calorie Malnutrition Hypoalbuminemia - resume TF when able  Best Practice (right click and "Reselect all SmartList Selections" daily)   Diet/type: NPO w/ meds via tube DVT prophylaxis: SCD GI prophylaxis: PPI Lines: Central line Foley:  Yes, and it is still needed.  Code Status:  full code Last date of multidisciplinary goals of care discussion [7/29 spoke with wife at bedside and updated on plan of care]  Critical care time: 50 min   Melody Comas, MD  Pulmonary & Critical Care Office: [phone]   See Amion for personal pager PCCM on call pager [phone] until 7pm. Please call Elink 7p-7a. [phone]

## 2022-05-19 NOTE — Progress Notes (Signed)
OT Cancellation Note  Patient Details Name: Spencer Humphrey MRN: [id number] DOB: 1966-09-30   Cancelled Treatment:    Reason Eval/Treat Not Completed: Medical issues which prohibited therapy. Pt ventilated and sedated. PEA with CPR this morning, with Code called. OT will follow up when medically ready.   Ercil Cassis Elane Shaylen Nephew 05/19/2022, 9:07 AM

## 2022-05-19 NOTE — Progress Notes (Signed)
eLink Physician-Brief Progress Note Patient Name: Spencer Humphrey DOB: [birth year] MRN: [id number]   Date of Service  05/19/2022  HPI/Events of Note  7/27- 56 y/o M with resp failure- was inpt 7/6-7/20 for Active ETOH withdrawal, seizures and had traumatic oral airway, requiring trach placement >> d/c to [facility]. Arrived here 7/23  w/bleeding trach, hypotensive.  CCM orally intubated, removed trach & packed site.   Plan was possible re-do trach, but hoping to give him one more shot at extubation. 7/27 -extubated . Called by bedside RN for agitation despite Precedex max   7/29- during the day shift patient had some issues with respiratory distress, that seemed to be positional and possibly resolved with nebs.   Overnight Patient was reportedly coughing up thick bloody clots from stoma, then went into respiratory distress-> PEA arrest and cpr initiated. Patient given multiple epi and bicarb.  Unable visualize and place ETT. Emergent trach placed via stoma with ROSC. Patient receiving 2 units PRBC for blood loss from trachea. Also on epi, levo, and vaso.  Now with seizure activity vs myoclonus   eICU Interventions  -poor neuro prognosis  -avoiding Propofol per ground team at bedside  -ordered prn Versed         Tarita Deshmukh N Kirsty Monjaraz 05/19/2022, 4:19 AM

## 2022-05-19 NOTE — Procedures (Signed)
Patient Name: Spencer Humphrey  MRN: [id number]  Epilepsy Attending: Charlsie Quest  Referring Physician/Provider: Lidia Collum, PA-C  Date: 05/19/2022 Duration: 30.04 mins  Patient history: 56yo M s/p cardiac arrest. EEG to evaluate for seizure  Level of alertness: comatose  AEDs during EEG study: None  Technical aspects: This EEG study was done with scalp electrodes positioned according to the 10-20 International system of electrode placement. Electrical activity was reviewed with band pass filter of 1-70Hz , sensitivity of 7 uV/mm, display speed of 1mm/sec with a 60Hz  notched filter applied as appropriate. EEG data were recorded continuously and digitally stored.  Video monitoring was available and reviewed as appropriate.  Description: Patient was noted to have episodes of sudden eye opening with whole body jerking lasting 6-8 seconds every 4-7 minutes. Concomitant EEG showed generalized polyspikes consistent with myoclonic seizures.  In between seizures EEG showed generalized background suppression. Hyperventilation and photic stimulation were not performed.        ABNORMALITY -Myoclonic seizure, generalized -Background suppression, generalized  IMPRESSION: This study showed myoclonic seizures lasting 6-8 seconds every 4-7 minutes. Additionally there was evidence of profound diffuse encephalopathy. In the setting of cardiac arrest, this EEG pattern is suggestive of anoxic/hypoxic brain injury.  Dr. and Denese Killings, PA was notified.  Cian Costanzo Suzie Portela

## 2022-05-19 NOTE — Code Documentation (Signed)
  Patient Name: Spencer Humphrey   MRN: [id number]   Date of Birth/ Sex: Nov 20, 1965 , male      Admission Date: 05-20-2022  Attending Provider: Martina Sinner, MD  Primary Diagnosis: Tracheostomy complication [facility])   Indication: Pt was in his usual state of health until this AM, when he was noted to be in respiratory arrest after expectorating a large blood clot and subsequently developed PEA arrest. Code blue was subsequently called. At the time of arrival on scene, ACLS protocol was underway.   Technical Description:  - CPR performance duration:  10 minutes  - Was defibrillation or cardioversion used? No   - Was external pacer placed? Yes  - Was patient intubated pre/post CPR? Prior tracheostomy, extubated 7/27. PCCM attempting to place new trach after obtaining ROSC   Medications Administered: Y = Yes; Blank = No Amiodarone    Atropine    Calcium    Epinephrine  y  Lidocaine    Magnesium    Norepinephrine    Phenylephrine    Sodium bicarbonate    Vasopressin     Post CPR evaluation:  - Final Status - Was patient successfully resuscitated ? Yes - What is current rhythm? Sinus - What is current hemodynamic status?   Miscellaneous Information:  - Labs sent, including: none  - Primary team notified?  Yes  - Family Notified? No  - Additional notes/ transfer status: Patient with respiratory arrest likely due to bleeding from prior trach. Primary PCCM team at bedside replacing trach. Primary team will continue care and update family.      Quincy Simmonds, MD  05/19/2022, 2:15 AM

## 2022-05-19 NOTE — Progress Notes (Signed)
LTM maint complete - no skin breakdown under: fp1 Fp2 F4 [facility] monitored, Event button test confirmed by [facility].

## 2022-05-19 NOTE — Progress Notes (Signed)
Per CCM - hold TF at this time. Okay to give medications via cortrak

## 2022-05-19 NOTE — Progress Notes (Signed)
Patient transported to CT and back without complications. Prior to transport, copious amount of bloody secretions was obtained. MD made aware.

## 2022-05-19 NOTE — Procedures (Signed)
Central Venous Catheter Insertion Procedure Note  Spencer Humphrey  [id number]  04-Oct-1966  Date:05/19/22  Time:3:51 AM   Provider Performing:Millette Halberstam D Suzie Portela   Procedure: Insertion of Non-tunneled Central Venous [phone]) with US guidance ([id number])   Indication(s) Medication administration  Consent Risks of the procedure as well as the alternatives and risks of each were explained to the patient and/or caregiver.  Consent for the procedure was obtained and is signed in the bedside chart  Anesthesia Topical only with 1% lidocaine   Timeout Verified patient identification, verified procedure, site/side was marked, verified correct patient position, special equipment/implants available, medications/allergies/relevant history reviewed, required imaging and test results available.  Sterile Technique Maximal sterile technique including full sterile barrier drape, hand hygiene, sterile gown, sterile gloves, mask, hair covering, sterile ultrasound probe cover (if used).  Procedure Description Area of catheter insertion was cleaned with chlorhexidine and draped in sterile fashion.  With real-time ultrasound guidance a central venous catheter was placed into the left femoral vein. Nonpulsatile blood flow and easy flushing noted in all ports.  The catheter was sutured in place and sterile dressing applied.  Complications/Tolerance None; patient tolerated the procedure well. Chest X-ray is ordered to verify placement for internal jugular or subclavian cannulation.   Chest x-ray is not ordered for femoral cannulation.  EBL Minimal  Specimen(s) None  JD Anselm Lis [facility] Pulmonary & Critical Care 05/19/2022, 3:52 AM  Please see [email or web address] for pager details.  From 7A-7P if no response, please call [phone]. After hours, please call ELink [phone].

## 2022-05-19 NOTE — Progress Notes (Signed)
OTO-HNS PROGRESS NOTE  Presented to patient's bedside to evaluate due to recent trach bleed requiring emergent replacement of tracheostomy by critical care physician.  Patient off the floor in CT. Patient had PEA with arrest due to profound trach hemorrhage. Airway was stabilized by CCM by replacing tracheostomy tube. Discussed with critical care.  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 Recommended CTA neck and chest to further evaluate.  If fistula is noted, this would require either open procedure with CT surgery versus endovascular procedure to manage; however patient may have suffered significant anoxic brain injury during arrest. Neurology has been consulted. Monitor closely for bleeding. Will await results of imaging.   Thank you for allowing me to participate in the care of this patient. Please do not hesitate to contact me with any questions or concerns.   Laren Boom, DO Otolaryngology [facility]

## 2022-05-19 NOTE — Progress Notes (Signed)
LTM EEG hooked up and running - no initial skin breakdown - push button tested - neuro notified. [facility] monitoring.  

## 2022-05-19 NOTE — Procedures (Signed)
Critical Care Procedure Note   Called to bedside with patient in cardiac arrest due to suffocation from massive upper airway hemorrhage with large quantities of thick clots aspirated from airway after coughing episode.  See cardiac arrest sheet for details.  Intermittent ROSC achieved with high-quality CPR and epinephrine.  Complicated airway management.  I initially attempted to intubate the patient from above using a glide scope.  Obtained grade 2 view and visualized appropriate passage of ET tube through the cords but unable to ventilate likely due to persistent blood from below.  Attempted to pass tracheostomy through stoma by bronchoscopic guidance but airway obscured by blood.  I then inserted my finger into the stoma and was able to palpate the tracheal rings without obstruction distally.  Past tracheostomy over my finger without resistance.  Position and airway confirmed by capnography.  Saturations began to improve and ROSC was eventually obtained.  Tracheostomy sutured in at the flange and edges of stoma approximated with one 1-0 silk sutures.  Lynnell Catalan, MD [facility] ICU Physician [facility]  Pager: [phone] Or Epic Secure Chat After hours: [phone].  05/19/2022, 3:09 AM

## 2022-05-19 NOTE — Consult Note (Signed)
Neurology Consultation Reason for Consult: Anoxic brain injury Referring Physician: Roderic Palau  CC: Anoxic brain injury  History is obtained from: Chart  HPI: Spencer Humphrey is a 56 y.o. male with a history of epilepsy diagnosed in 2015 via EEG with photoparoxysmal response following a seizure.  He was on Keppra apparently for a year, but then went off of it (I am not sure why).  He had a seizure in early July, there was thought that alcohol withdrawal may have contributed, but due to a tongue laceration ended up having a very difficult airway and ultimately ended up with tracheostomy.  His mental status had improved and he was following commands and was recuperating in an LTAC.   He subsequently represented with bleeding from his tracheostomy site and unfortunately had a cardiac arrest secondary to bleeding around his tracheostomy last night.  CPR was performed for approximately 10 minutes.   Following his arrest, he began having generalized myoclonic jerks and therefore an EEG was obtained which demonstrates generalized background suppression with the exception of generalized epileptiform discharges during brief myoclonic jerks.     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 No pertinent family history.   Social History:  reports that he quit smoking about 19 years ago. His smoking use included cigarettes. He has never used smokeless tobacco. He reports that he does not currently use alcohol. He reports that he does not use drugs.   Exam: Current vital signs: BP 134/66   Pulse 73   Temp 99.7 F (37.6 C) (Esophageal)   Resp (!) 28   Ht 6' (1.829 m)   Wt 107.5 kg   SpO2 100%   BMI 32.14 kg/m  Vital signs in last 24 hours: Temp:  [98.7 F (37.1 C)-99.8 F (37.7 C)] 99.7 F (37.6 C) (07/29 0735) Pulse Rate:  [71-220] 73 (07/29  0630) Resp:  [15-45] 28 (07/29 0630) BP: (92-167)/(15-121) 134/66 (07/29 0630) SpO2:  [52 %-100 %] 100 % (07/29 0741) FiO2 (%):  [40 %-100 %] 40 % (07/29 0741)   Physical Exam  Intubated, ventilated  Neuro: Mental Status: Patient is comatose, does not open eyes or follow commands  cranial Nerves: II: Pupils are reactive, 3 mm but sluggish  III,IV, VI: No movement to doll's eye maneuver, slightly disconjugate but forward facing V: VII: Corneals are absent Motor: No movement to noxious stimulation in any extremity  sensory: As above  Cerebellar: Does not perform     I have reviewed labs in epic and the results pertinent to this consultation are: Sodium 145 Creatinine 1.2 Ca 8.8  I have reviewed the images obtained: CT head-I suspect there is some early blurring of the gray-white junction, particular with deep gray matter.  Impression: 56 year old male with generalized suppression with myoclonic bursts on EEG after cardiac arrest.  My suspicion is that he has had a devastating cerebral injury, but only a few hours after injury, I think it is too early at this time.  I would favor repeating CT in the morning  Recommendations: 1) repeat CT in the morning 2) continue Keppra 3) if myoclonic jerks return, can increase propofol to titrate to cessation of movements 4) neurology will follow  This patient is critically ill and at significant risk of neurological worsening, death and care requires  constant monitoring of vital signs, hemodynamics,respiratory and cardiac monitoring, neurological assessment, discussion with family, other specialists and medical decision making of high complexity. I spent 45 minutes of neurocritical care time  in the care of  this patient. This was time spent independent of any time provided by nurse practitioner or PA.  Ritta Slot, MD [facility] [phone]  If 7pm- 7am, please page neurology on call as listed in  AMION. 05/19/2022  11:09 AM

## 2022-05-19 NOTE — Procedures (Signed)
Bronchoscopy Procedure Note  Spencer Humphrey  [id number]  [birth year]  Date:05/19/22  Time:4:36 PM   Provider Performing:Suanne Minahan B Mizani Dilday   Procedure(s):  Flexible Bronchoscopy (97026)  Indication(s) Tracheostomy Complication  Consent Unable to obtain consent due to emergent nature of procedure. Family notified via phone  Anesthesia Propofol and Rocuronium   Time Out Verified patient identification, verified procedure, site/side was marked, verified correct patient position, special equipment/implants available, medications/allergies/relevant history reviewed, required imaging and test results available.   Sterile Technique Usual hand hygiene, masks, gowns, and gloves were used   Procedure Description Bronchoscope advanced through tracheostomy tube and into airway.  Airways were examined down to subsegmental level with findings noted below.  Unable to get good view of trachea above the level of the tracheostomy due to edematous anatomy.   Findings:  - normal appearing airways once bloody secretions and blood clots suctioned to clarity   Complications/Tolerance None; patient tolerated the procedure well. Chest X-ray is not needed post procedure.   EBL Minimal   Specimen(s) none

## 2022-05-19 NOTE — Progress Notes (Signed)
Stat  EEG complete - results pending.  

## 2022-05-19 NOTE — Progress Notes (Signed)
Pharmacy Antibiotic Note  [name] is a 56 y.o. male admitted on 04/21/2022 presenting from [facility] with hypotension, concern for sepsis.  Pharmacy had been consulted for vancomycin and cefepime dosing. - d/c 7/29  S/p bronch with bloody copius secretions concern for aspiration pna > asked to start Unasyn  Plan: Unasyn 3gm IV q6hr  Height: 6' (182.9 cm) Weight: 107.5 kg (236 lb 15.9 oz) IBW/kg (Calculated) : 77.6  Temp (24hrs), Avg:98.8 F (37.1 C), Min:97.5 F (36.4 C), Max:99.8 F (37.7 C)  Recent Labs  Lab 05/14/22 1245 05/15/22 0454 05/16/22 0500 05/17/22 0434 05/17/22 1400 05/18/22 0435 05/19/22 0019 05/19/22 0256 05/19/22 0630 05/19/22 0712 05/19/22 1212  WBC  --    < > 9.6 6.7  --  7.5 7.6  --  14.2*  --   --   CREATININE  --    < > 0.79 1.19 1.29* 1.10 0.88 1.30* 1.20  --   --   LATICACIDVEN 1.4  --   --   --   --   --   --  >9.0* 3.3* 2.5* 1.6   < > = values in this interval not displayed.     Estimated Creatinine Clearance: 87.1 mL/min (by C-G formula based on SCr of 1.2 mg/dL).    Allergies  Allergen Reactions   Fenofibrate Nausea Only   Penicillins Other (See Comments)    Unknown      Leota Sauers Pharm.D. CPP, BCPS Clinical Pharmacist [phone] 05/19/2022 7:20 PM

## 2022-05-19 NOTE — Progress Notes (Addendum)
PATIENT NAME: Spencer Humphrey MEDICAL RECORD NUMBER: [id number] Birthday: 09-27-1966  Age: 56 y.o. Admit Date: Jun 06, 2022  Provider: Dr. Denese Killings; Patsy Lager PA-C  Indication: PEA arrest  Technical Description:  CPR performance duration: 23 minutes Was defibrillation or cardioversion used ? no Was external pacer placed ? no Was patient intubated pre/post CPR ? Unable to intubate; emergent trach placed Was transvenous pacer placed ? no  Medications Administered Include      Yes/no Amiodarone   Atropin   Calcium   Epinephrine yes  Lidocaine   Magnesium   Norepinephrine yes  Phenylephrine   Sodium bicarbonate yes  Vasopression yes   Evaluation Final Status - Was patient successfully resuscitated ? yes If successfully resuscitated - what is current rhythm ? NSR If successfully resuscitated - what is current hemodynamic status ? Unstable on 3 pressors  Miscellaneous Information Patient was coughing up thick bloody clots from stoma. Also having respiratory distress. Patient then went into PEA arrest and cpr initiated. Patient given multiple epi and bicarb. CRNA and Dr. Denese Killings unable visualize and place ETT. Emergent trach placed with color change on co2 detector. Pulse obtained shortly after trach placement. Patient receiving 2 units PRBC for blood loss from trachea. Also on epi, levo, and vaso.  PEA arrest Shock Acute respiratory failure with hypoxia Plan: -telemetry monitoring -continue to wean pressors for MAP goal >65 -will place central line -will transfuse 2 unit prbc -Give 1 liter fluid bolus -check stat cbc, bmp, mag -trend troponin and lactate -check ABG -CXR -TTM normothermia protocol in place -will need to call ENT in am to evaluate stoma site and tracheostomy -continue vent on PRVC -wean for sats >92% -CT head and EEG ordered  GOC: Updated wife in conference room. She would like to keep patient full code for now and continue to do everything we  can.  Lidia Collum 7/29/20233:56 AM

## 2022-05-20 ENCOUNTER — Inpatient Hospital Stay (HOSPITAL_COMMUNITY): Payer: Managed Care, Other (non HMO)

## 2022-05-20 ENCOUNTER — Other Ambulatory Visit: Payer: Self-pay

## 2022-05-20 DIAGNOSIS — R569 Unspecified convulsions: Secondary | ICD-10-CM | POA: Diagnosis not present

## 2022-05-20 DIAGNOSIS — J9500 Unspecified tracheostomy complication: Secondary | ICD-10-CM | POA: Diagnosis not present

## 2022-05-20 DIAGNOSIS — G9341 Metabolic encephalopathy: Secondary | ICD-10-CM | POA: Diagnosis not present

## 2022-05-20 LAB — TYPE AND SCREEN
ABO/RH(D): O POS
Antibody Screen: NEGATIVE
Unit division: 0
Unit division: 0
Unit division: 0
Unit division: 0
Unit division: 0

## 2022-05-20 LAB — BPAM RBC
Blood Product Expiration Date: 202308262359
Blood Product Expiration Date: 202308272359
Blood Product Expiration Date: 202308272359
Blood Product Expiration Date: 202308272359
Blood Product Expiration Date: 202308292359
ISSUE DATE / TIME: 202307270804
ISSUE DATE / TIME: 202307290214
ISSUE DATE / TIME: 202307290214
ISSUE DATE / TIME: 202307290537
ISSUE DATE / TIME: 202307290630
Unit Type and Rh: 5100
Unit Type and Rh: 5100
Unit Type and Rh: 5100
Unit Type and Rh: 5100
Unit Type and Rh: 5100

## 2022-05-20 LAB — POCT I-STAT 7, (LYTES, BLD GAS, ICA,H+H)
Acid-Base Excess: 1 mmol/L (ref 0.0–2.0)
Bicarbonate: 24.1 mmol/L (ref 20.0–28.0)
Calcium, Ion: 1.28 mmol/L (ref 1.15–1.40)
HCT: 23 % — ABNORMAL LOW (ref 39.0–52.0)
Hemoglobin: 7.8 g/dL — ABNORMAL LOW (ref 13.0–17.0)
O2 Saturation: 93 %
Patient temperature: 36
Potassium: 3.6 mmol/L (ref 3.5–5.1)
Sodium: 147 mmol/L — ABNORMAL HIGH (ref 135–145)
TCO2: 25 mmol/L (ref 22–32)
pCO2 arterial: 29.6 mmHg — ABNORMAL LOW (ref 32–48)
pH, Arterial: 7.515 — ABNORMAL HIGH (ref 7.35–7.45)
pO2, Arterial: 57 mmHg — ABNORMAL LOW (ref 83–108)

## 2022-05-20 LAB — DIFFERENTIAL
Abs Immature Granulocytes: 0.25 10*3/uL — ABNORMAL HIGH (ref 0.00–0.07)
Basophils Absolute: 0 10*3/uL (ref 0.0–0.1)
Basophils Relative: 0 %
Eosinophils Absolute: 0.1 10*3/uL (ref 0.0–0.5)
Eosinophils Relative: 1 %
Immature Granulocytes: 3 %
Lymphocytes Relative: 13 %
Lymphs Abs: 1.2 10*3/uL (ref 0.7–4.0)
Monocytes Absolute: 1.3 10*3/uL — ABNORMAL HIGH (ref 0.1–1.0)
Monocytes Relative: 14 %
Neutro Abs: 6.4 10*3/uL (ref 1.7–7.7)
Neutrophils Relative %: 69 %

## 2022-05-20 LAB — GLUCOSE, CAPILLARY
Glucose-Capillary: 114 mg/dL — ABNORMAL HIGH (ref 70–99)
Glucose-Capillary: 117 mg/dL — ABNORMAL HIGH (ref 70–99)
Glucose-Capillary: 118 mg/dL — ABNORMAL HIGH (ref 70–99)
Glucose-Capillary: 123 mg/dL — ABNORMAL HIGH (ref 70–99)
Glucose-Capillary: 123 mg/dL — ABNORMAL HIGH (ref 70–99)
Glucose-Capillary: 135 mg/dL — ABNORMAL HIGH (ref 70–99)

## 2022-05-20 LAB — CBC
HCT: 25.6 % — ABNORMAL LOW (ref 39.0–52.0)
Hemoglobin: 8.3 g/dL — ABNORMAL LOW (ref 13.0–17.0)
MCH: 30.7 pg (ref 26.0–34.0)
MCHC: 32.4 g/dL (ref 30.0–36.0)
MCV: 94.8 fL (ref 80.0–100.0)
Platelets: 140 10*3/uL — ABNORMAL LOW (ref 150–400)
RBC: 2.7 MIL/uL — ABNORMAL LOW (ref 4.22–5.81)
RDW: 22.4 % — ABNORMAL HIGH (ref 11.5–15.5)
WBC: 9.3 10*3/uL (ref 4.0–10.5)
nRBC: 0 % (ref 0.0–0.2)

## 2022-05-20 LAB — COMPREHENSIVE METABOLIC PANEL
ALT: 30 U/L (ref 0–44)
AST: 73 U/L — ABNORMAL HIGH (ref 15–41)
Albumin: 1.9 g/dL — ABNORMAL LOW (ref 3.5–5.0)
Alkaline Phosphatase: 111 U/L (ref 38–126)
Anion gap: 6 (ref 5–15)
BUN: 33 mg/dL — ABNORMAL HIGH (ref 6–20)
CO2: 22 mmol/L (ref 22–32)
Calcium: 8.4 mg/dL — ABNORMAL LOW (ref 8.9–10.3)
Chloride: 116 mmol/L — ABNORMAL HIGH (ref 98–111)
Creatinine, Ser: 1.26 mg/dL — ABNORMAL HIGH (ref 0.61–1.24)
GFR, Estimated: >60 mL/min (ref >60)
Glucose, Bld: 116 mg/dL — ABNORMAL HIGH (ref 70–99)
Potassium: 3.6 mmol/L (ref 3.5–5.1)
Sodium: 144 mmol/L (ref 135–145)
Total Bilirubin: 1.2 mg/dL (ref 0.3–1.2)
Total Protein: 5.5 g/dL — ABNORMAL LOW (ref 6.5–8.1)

## 2022-05-20 LAB — TRIGLYCERIDES: Triglycerides: 172 mg/dL — ABNORMAL HIGH (ref <150)

## 2022-05-20 LAB — MAGNESIUM: Magnesium: 2 mg/dL (ref 1.7–2.4)

## 2022-05-20 LAB — PHOSPHORUS: Phosphorus: 3 mg/dL (ref 2.5–4.6)

## 2022-05-20 MED ORDER — SENNA 8.6 MG PO TABS
2.0000 | ORAL_TABLET | Freq: Every day | ORAL | Status: DC
  Administered 2022-05-20: 17.2 mg
  Filled 2022-05-20 (×2): qty 2

## 2022-05-20 MED ORDER — DEXTROSE 50 % IV SOLN: 25.0000 g | INTRAVENOUS | Status: DC

## 2022-05-20 MED ORDER — SODIUM CHLORIDE 0.9% FLUSH: 10.0000 mL | INTRAVENOUS | Status: DC | PRN

## 2022-05-20 MED ORDER — SODIUM CHLORIDE 0.9% FLUSH
10.0000 mL | Freq: Two times a day (BID) | INTRAVENOUS | Status: DC
  Administered 2022-05-20: 30 mL
  Administered 2022-05-20: 10 mL
  Administered 2022-05-21: 20 mL
  Administered 2022-05-21: 30 mL

## 2022-05-20 MED ORDER — POTASSIUM CHLORIDE 20 MEQ PO PACK
40.0000 meq | PACK | Freq: Once | ORAL | Status: AC
  Administered 2022-05-20: 40 meq
  Filled 2022-05-20: qty 2

## 2022-05-20 NOTE — Procedures (Signed)
MRN: [id number]  Epilepsy Attending: Charlsie Quest  Referring Physician/Provider: Charlsie Quest, MD Duration: 05/20/2022 0710 05/20/2022 3729   Patient history: 56yo M s/p cardiac arrest. EEG to evaluate for seizure   Level of alertness: comatose   AEDs during EEG study: propofol, LEV   Technical aspects: This EEG study was done with scalp electrodes positioned according to the 10-20 International system of electrode placement. Electrical activity was reviewed with band pass filter of 1-70Hz , sensitivity of 7 uV/mm, display speed of 46mm/sec with a 60Hz  notched filter applied as appropriate. EEG data were recorded continuously and digitally stored.  Video monitoring was available and reviewed as appropriate.   Description: EEG showed generalized background suppression not reactive to tactile stimulation. Hyperventilation and photic stimulation were not performed.         ABNORMALITY -Background suppression, generalized   IMPRESSION: This study was suggestive of profound diffuse encephalopathy. No seizures were seen during this study. In the setting of cardiac arrest, this EEG pattern is suggestive of anoxic/hypoxic brain injury.  Norie Latendresse 

## 2022-05-20 NOTE — Progress Notes (Signed)
OT Cancellation Note  Patient Details Name: Spencer Humphrey MRN: [id number] DOB: 1966-06-04   Cancelled Treatment:    Reason Eval/Treat Not Completed: Patient not medically ready. OT will sign off at this time. Please reconsult if/when pt is medically appropriate for therapy to work with.   Rumeal Cullipher Elane Zayonna Ayuso 05/20/2022, 9:07 AM

## 2022-05-20 NOTE — Progress Notes (Signed)
ENT PROGRESS NOTE   Subjective: Patient seen and examined at bedside. No family at bedside. Patient unresponsive on vent. No active bleeding noted around trach.   Objective: Vital signs in last 24 hours: Temp:  [97 F (36.1 C)-99 F (37.2 C)] 98.1 F (36.7 C) (07/30 1125) Pulse Rate:  [78-93] 86 (07/30 1131) Resp:  [16-33] 26 (07/30 1131) BP: (97-129)/(32-82) 123/63 (07/30 1131) SpO2:  [97 %-100 %] 97 % (07/30 1131) FiO2 (%):  [40 %] 40 % (07/30 1131) Weight:  [107 kg] 107 kg (07/30 0500)  CONSTITUTIONAL: sedated, unresponsive on vent HENT: Head : normocephalic and atraumatic NECK: Shiley trach in place, patent, well secured. Suture intact under trach, 4x4 gauze packed into wound and sutured in, removed. Silver nitrate used to cauterize some granulation tissue, suture replaced.   Recent Labs    05/19/22 0256 05/19/22 0257 05/19/22 0630 05/19/22 1116 05/19/22 1716 05/20/22 0802  NA 145   < > 145   < > 146* 147*  K 3.8   < > 3.3*   < > 4.2 3.6  CL 113*  --  112*  --   --   --   CO2 16*  --  23  --   --   --   GLUCOSE 242*  --  199*  --   --   --   BUN 24*  --  25*  --   --   --   CREATININE 1.30*  --  1.20  --   --   --   CALCIUM 8.7*  --  8.8*  --   --   --    < > = values in this interval not displayed.    Medications: I have reviewed the patient's current medications.  New Imaging:   EXAM: CT ANGIOGRAPHY NECK   TECHNIQUE: Multidetector CT imaging of the neck was performed using the standard protocol during bolus administration of intravenous contrast. Multiplanar CT image reconstructions and MIPs were obtained to evaluate the vascular anatomy. Carotid stenosis measurements (when applicable) are obtained utilizing NASCET criteria, using the distal internal carotid diameter as the denominator.   RADIATION DOSE REDUCTION: This exam was performed according to the departmental dose-optimization program which includes automated exposure control, adjustment of the  mA and/or kV according to patient size and/or use of iterative reconstruction technique.   CONTRAST:  OMNIPAQUE IOHEXOL 350 MG/ML SOLN   COMPARISON:  None Available.   FINDINGS: Aortic arch: Common origin of the left common carotid artery and innominate artery noted. Atherosclerotic calcifications present in the distal arch. No focal stenosis or aneurysm is present.   Right carotid system: The innominate artery is separate from the tracheostomy site. No evidence for fistula. The right common carotid artery is within normal limits. Bifurcation is unremarkable. Right internal carotid artery is within normal limits into the skull base.   Left carotid system: The left common carotid artery is unremarkable. Bifurcation is within normal limits. The left internal carotid artery is within normal limits into the skull base.   Vertebral arteries: Right vertebral artery is dominant vessel. Vertebral arteries originate from the subclavian arteries without significant stenosis. No significant stenosis is present in either vertebral artery in the neck. The left vertebral artery terminates at PICA. The right vertebral artery forms the basilar artery. No focal stenosis is present.   Skeleton: Degenerative changes present cervical spine most C5-6 right. No focal osseous lesions are present.   Other neck: Extensive secretions are present in the nasopharynx  and oropharynx. No definite mass lesion is present. No or tibial source of hemorrhage is identified. NG tube and oral temperature probe are in place. Soft tissues the neck are otherwise unremarkable.   Upper chest: Lung apices are clear. Thoracic inlet is within normal limits.   IMPRESSION: 1. No evidence for fistula or other source of hemorrhage at the trachea site. 2. Extensive secretions in the nasopharynx and oropharynx without definite mass lesion. Some of this could be hemorrhage. No definite source. 3. NG tube and oral  temperature probe are in place.   EXAM: CT HEAD WITHOUT CONTRAST   TECHNIQUE: Contiguous axial images were obtained from the base of the skull through the vertex without intravenous contrast.   RADIATION DOSE REDUCTION: This exam was performed according to the departmental dose-optimization program which includes automated exposure control, adjustment of the mA and/or kV according to patient size and/or use of iterative reconstruction technique.   COMPARISON:  Head CT 29.  Head CT and brain MRI 02/12/2014.   FINDINGS: Brain: Ventricles and sulci appear slightly larger compared to the 2015 CT compatible with some interval cerebral volume loss. No midline shift, mass effect, or evidence of intracranial mass lesion. Basilar cisterns are normal. No acute intracranial hemorrhage identified.   Gray-white differentiation does appear muted throughout the brain compared to the 2015 exam. And in particular, there is poor differentiation at the deep gray nuclei and deep white matter capsules. But there is no obvious cerebral edema.   Vascular: Mild Calcified atherosclerosis at the skull base. No suspicious intracranial vascular hyperdensity.   Skull: Chronic appearing nasal bone fractures. Congenital incomplete ossification of the posterior C1 ring. No acute osseous abnormality identified.   Sinuses/Orbits: Pansinus opacification, right greater than left middle ear and mastoid opacification unchanged from yesterday. Right nasoenteric tube in place.   Other: Partially visible fluid in the pharynx with nasoenteric tube. Disconjugate gaze. Scalp EEG electrodes.   IMPRESSION: Diminished gray-white matter differentiation throughout the brain - and especially at the deep gray nuclei and deep white matter capsules - when compared to a 2015 CT.   However, no intracranial mass effect, hemorrhage, or obvious cerebral edema.   This constellation is suspicious for recent anoxic  injury. Noncontrast Brain MRI (could be compared to 2015 exam) or repeat head CT in several days would best confirm.   Assessment/Plan: Spencer Humphrey is a 56 y/o M with history of trach for chronic respiratory failure placed at [facility] 07/18, with complication of bleeding. Patient decannulated and then extubated 07/27. Trach emergently replaced 07/29 due to PEA arrest after extensive tarch bleeding.  -CTA negative for fistula -No significant trach bleeding overnight -Patient currently being evaluated by Neurology due to concern for hypoxic brain injury. Plan for MRI tomorrow. -Will hold off on any further intervention to trach while hemostatic and awaiting results of MRI -Recommend application of TXA soaked gauze onto/packed into bleeding area every 4 hours as needed for active bleeding -Can administer Racemic Epi via trach collar every 4 hours as needed for bleeding -Please call ENT with any questions/concerns    LOS: 7 days    Laren Boom, DO [facility] 05/20/2022, 12:01 PM

## 2022-05-20 NOTE — Progress Notes (Signed)
LTM EEG discontinued - no skin breakdown at unhook.   

## 2022-05-20 NOTE — Progress Notes (Signed)
Subjective: No significant changes  Exam: Vitals:   05/20/22 1125 05/20/22 1131  BP:  123/63  Pulse:  86  Resp:  (!) 26  Temp: 98.1 F (36.7 C)   SpO2:  97%   Gen: In bed, intubated   Neuro: MS: Does not open eyes or follow commands CN: Pupils are reactive bilaterally, the sluggish, corneals are absent, eyes are slightly disconjugate with absent doll's maneuver, nurse reports cough is intact Motor: No movement to noxious stimulation Sensory: As above  Pertinent Labs: PCO2 29  Impression: 56 year old male status post cardiac arrest.  Based on absent corneals at 24 hours, lack of motor response, early myoclonus, and malignant EEG, I do not feel that he has any significant chance of recovery.  His CT is equivocal, and I do think confirmation of hypoxic injury with MRI would be prudent.  If MRI does confirm hypoxic injury, then I think conversations with the family would be palliative in nature only.  I discussed with his wife what he would want in a situation where he was not going to recover, and she indicates that he would not want prolonged life support.  Recommendations: 1) MRI brain 2) if confirmation of hypoxic brain injury, then would recommend withdrawal of care based on what  family thinks he would want the situation.  This patient is critically ill and at significant risk of neurological worsening, death and care requires constant monitoring of vital signs, hemodynamics,respiratory and cardiac monitoring, neurological assessment, discussion with family, other specialists and medical decision making of high complexity. I spent 35 minutes of neurocritical care time  in the care of  this patient. This was time spent independent of any time provided by nurse practitioner or PA.  Ritta Slot, MD [facility] [phone]  If 7pm- 7am, please page neurology on call as listed in AMION. 05/20/2022  12:27 PM

## 2022-05-20 NOTE — Progress Notes (Signed)
NAME:  Spencer Humphrey, MRN:  [id number], DOB:  1966/07/12, LOS: 7 ADMISSION DATE:  06/12/22, CONSULTATION DATE:  Jun 12, 2022 REFERRING MD:  Donnald Garre - EM, CHIEF COMPLAINT:  tracheostomy complication.    History of Present Illness:  56 yo CM PMH etoh abuse, etoh withdrawal seizure, s/p trachostomy, MSSA VAP strep bacteremia who presented to [facility] ED 7/23 via  EMS from [facility] for bleeding trach and associated hypotension.   Admitted to [facility] from 7/6 - 7/20 with alcohol withdrawal seizures, tongue laceration.  Hospital course at [facility] complicated by vent associated pneumonia with MSSA, streptococcal bacteremia. Tracheostomy placed on 7/18 for failure to wean and transferred to [facility]  In ED, required pressors for SBP 60s and transfusion via [place]. R fem CVC placed. ENT consulted. Endotracheally intubated and Trach removed, with no further active bleeding noted.   PCCM consulted for admission in this settng  Pertinent  Medical History  Etoh abuse Alcohol withdrawal seizure   Significant Hospital Events: Including procedures, antibiotic start and stop dates in addition to other pertinent events   7/23 ED from [facility] with trach bleeding. Orally intubated in the ED, ENT consulted 7/25 weaned off levo 7/27 extubated, precedex started and up titrated  7/28 very agitated started libirum taper  7/29 PEA cardiac arrest, trach re-placed, bed side bronchoscopy with no obvious signs of hemoptysis from lower bronchial tree  Interim History / Subjective:   No acute events overnight Patient wife and daughter at bedside. Updated with neurology about CT head scan concerning for anoxic injury. Will obtain MRI for better clarity.  Objective   Blood pressure (!) 111/59, pulse 82, temperature (!) 97 F (36.1 C), resp. rate 20, height 6' (1.829 m), weight 107 kg, SpO2 98 %.    Vent Mode: PRVC FiO2 (%):  [40 %] 40 % Set Rate:  [12 bmp-22 bmp] 12 bmp Vt Set:  [530 mL-620 mL] 530  mL PEEP:  [5 cmH20] 5 cmH20 Pressure Support:  [10 cmH20] 10 cmH20 Plateau Pressure:  [16 cmH20-19 cmH20] 17 cmH20   Intake/Output Summary (Last 24 hours) at 05/20/2022 1019 Last data filed at 05/20/2022 0600 Gross per 24 hour  Intake 1204.26 ml  Output 935 ml  Net 269.26 ml   Filed Weights   05/16/22 0500 05/17/22 0500 05/20/22 0500  Weight: 105.7 kg 107.5 kg 107 kg    Examination: General: sedated, intubated, cEEG in place, no acute distress  HENT: tracheostomy inplace, bloody dressing surround tracheostomy site  Pulm: course breath sounds on right, no wheezing Card: RRR, no murmurs Abd: soft, non-distended, BS+ Ext: warm, trace RLE edema Neuro: sedated, opened eyes spontaneously GU: foley in place  Resolved Hospital Problem list   Mssa PNA Strep bacteremia  Septic shock  H/o Strep Bacteremia  Assessment & Plan:   PEA Cardiac Arrest Shock Lactic Acidosis 7/29 re-bleed at tracheostomy site with respiratory arrest leading to PEA cardiac arrest.  - supportive care - continue vasopressor support, wean for MAP 65 or greater  Acute respiratory failure with hypoxia w/ VDRF, s/p tracheostomy -- in setting of tongue lac from etoh withdrawal seizure Tracheostomy complication (looks like stoma eroded) with new bleeding  c/b Aspiration PNA  -trach placed at [facility] 7/18. At [facility] on rounds 7/23 noted to have extensive bleeding; successfully extubated 7/27; trach replaced 7/29 during PEA cardiac arrest due to bleeding at trach site again - Continue ventilator support - ENT following, may not need tracheostomy stoma evaluation due to brain injury. CTA Neck negative  for concern of fistula - Unasyn for aspiration  Anoxic Brain Injury Acute encephalopathy - shock, toxic metabolic  EtOH abuse disorder Recent alcohol withdrawal c/b seizures Hx of seizure disorder -1 sz on 7/8. When withdrawal window passed, AEDs weaned and reportedly no further sz activity  -CT head 7/11 No  acute findings, MRI brain 7/15 no acute findings, LP obtained and was non-infectious. Started lactulose for hyperammonemia -at time of discharge, noted to have " improving" mental status but not at baseline.  - Supportive care - Cont thiamine, folate and MVI - Neurology following - Continue keppra - MRI ordered - cEEG in place  Acute Kidney Injury Hypokalemia - monitor Cr and UOP - replete potassium  Urinary retention - foley in place  Anemia Thrombocytopenia -transfused 7/27; hgb stable today as are PLTs stable  Plan Trend cbc Trigger for transfusion < 7 SCDs  Protein Calorie Malnutrition Hypoalbuminemia - resume TF when able  Best Practice (right click and "Reselect all SmartList Selections" daily)   Diet/type: NPO w/ meds via tube DVT prophylaxis: SCD GI prophylaxis: PPI Lines: Central line Foley:  Yes, and it is still needed.  Code Status:  full code Last date of multidisciplinary goals of care discussion [7/30 spoke with wife at bedside and updated on plan of care]  Critical care time: 40 min   Melody Comas, MD [facility] Office: [phone]   See Amion for personal pager PCCM on call pager [phone] until 7pm. Please call Elink 7p-7a. [phone]

## 2022-05-20 NOTE — Procedures (Signed)
Patient Name: Spencer Humphrey  MRN: [id number]  Epilepsy Attending: Charlsie Quest  Referring Physician/Provider: Charlsie Quest, MD Duration: 05/19/2022 0710 05/20/2022 0710   Patient history: 56yo M s/p cardiac arrest. EEG to evaluate for seizure   Level of alertness: comatose   AEDs during EEG study: propofol, LEV   Technical aspects: This EEG study was done with scalp electrodes positioned according to the 10-20 International system of electrode placement. Electrical activity was reviewed with band pass filter of 1-70Hz , sensitivity of 7 uV/mm, display speed of 51mm/sec with a 60Hz  notched filter applied as appropriate. EEG data were recorded continuously and digitally stored.  Video monitoring was available and reviewed as appropriate.   Description: At the beginning of study, patient was noted to have episodes of sudden eye opening with whole body jerking lasting 2-3 seconds about once every hour. Concomitant EEG showed generalized polyspikes consistent with myoclonic seizures. Last seizure was noted on 05/19/2022 at 1333. EEG then showed generalized background suppression not reactive to tactile stimulation. Hyperventilation and photic stimulation were not performed.         ABNORMALITY -Myoclonic seizure, generalized -Background suppression, generalized   IMPRESSION: This study initially showed myoclonic seizures lasting 2-3 seconds about once every hour. Last seizure was noted on 05/19/2022 at 1333.  EEG was then suggestive of profound diffuse encephalopathy. In the setting of cardiac arrest, this EEG pattern is suggestive of anoxic/hypoxic brain injury.   [name] 05/21/2022

## 2022-05-20 NOTE — Progress Notes (Signed)
Patient transported to MRI and back without complications.  

## 2022-05-20 NOTE — Progress Notes (Signed)
PT Cancellation/DC Note  Patient Details Name: Spencer Humphrey MRN: [id number] DOB: 04-May-1966   Cancelled Treatment:    Reason Eval/Treat Not Completed: Patient not medically ready. Will sign off. Please re-order if/when appropriate.   Angelina Ok [facility] 05/20/2022, 8:54 AM Skip Mayer PT Acute [employer] [phone]

## 2022-05-21 ENCOUNTER — Other Ambulatory Visit: Payer: Self-pay

## 2022-05-21 DIAGNOSIS — J9500 Unspecified tracheostomy complication: Secondary | ICD-10-CM | POA: Diagnosis not present

## 2022-05-21 LAB — GLUCOSE, CAPILLARY
Glucose-Capillary: 102 mg/dL — ABNORMAL HIGH (ref 70–99)
Glucose-Capillary: 108 mg/dL — ABNORMAL HIGH (ref 70–99)
Glucose-Capillary: 113 mg/dL — ABNORMAL HIGH (ref 70–99)
Glucose-Capillary: 113 mg/dL — ABNORMAL HIGH (ref 70–99)
Glucose-Capillary: 127 mg/dL — ABNORMAL HIGH (ref 70–99)
Glucose-Capillary: 96 mg/dL (ref 70–99)

## 2022-05-21 MED ORDER — SODIUM CHLORIDE 0.9 % IV SOLN: INTRAVENOUS | Status: DC | PRN

## 2022-05-21 NOTE — IPAL (Signed)
  Interdisciplinary Goals of Care Family Meeting   Date carried out: 05/21/2022  Location of the meeting: Bedside  Member's involved: Physician, Bedside Registered Nurse, Social Worker, and Family Member or next of kin  Durable Power of Attorney or acting medical decision maker: Spencer Humphrey  Discussion: We discussed goals of care for Spencer Humphrey .  Patient's nurse, wife and 2 daughters were present at the bedside. We reviewed the MRI results of severe anoxic brain injury and neurology's recommendations that he would not have meaningful recovery. Given his wishes discussed yesterday that he would not want life sustaining measures in a situation like this, we discussed transition of care. The family would like a couple of days before transition to comfort care. They are agreeable to DNR code status. Chaplan services were requested.  Code status: Full DNR  Disposition: Continue current acute care with transition to comfort care in coming days.  Time spent for the meeting: 15 minutes    Martina Sinner, MD  05/21/2022, 9:23 AM

## 2022-05-21 NOTE — Progress Notes (Addendum)
This chaplain responded to unit page for spiritual care. The chaplain appreciates the RN-Phyllis update. The chaplain will return and offer a spiritual care presence with the Pt. family this afternoon.   **1322 The chaplain is present with the Pt. and family as requested by the unit. The Pt. daughter-Brooke moves to the other side of the room to talk to the chaplain. The Pt. wife-Leslie remains on the other side of the room.   Nehemiah Settle introduces herself as the person requesting the chaplain visit and requesting education on what to do next. The chaplain listened reflectively as she talked with Nehemiah Settle on what may happen with the transition to comfort care and a hospital death. The chaplain reminded Nehemiah Settle the RN is available to answer questions also. The chaplain affirms Nehemiah Settle presence as she asked about funeral homes, grief care, and the faith of the Pt.  The chaplain offered F/U spiritual care as needed.  Chaplain Stephanie Acre [phone]

## 2022-05-21 NOTE — Progress Notes (Signed)
NAME:  Spencer Humphrey, MRN:  [id number], DOB:  [birth year], LOS: 8 ADMISSION DATE:  May 23, 2022, CONSULTATION DATE:  05/23/2022 REFERRING MD:  Donnald Garre - EM, CHIEF COMPLAINT:  tracheostomy complication.    History of Present Illness:  56 yo CM PMH etoh abuse, etoh withdrawal seizure, s/p trachostomy, MSSA VAP strep bacteremia who presented to [facility] ED 7/23 via  EMS from [facility] for bleeding trach and associated hypotension.   Admitted to [facility] from 7/6 - 7/20 with alcohol withdrawal seizures, tongue laceration.  Hospital course at [facility] complicated by vent associated pneumonia with MSSA, streptococcal bacteremia. Tracheostomy placed on 7/18 for failure to wean and transferred to [facility]  In ED, required pressors for SBP 60s and transfusion via [place]. R fem CVC placed. ENT consulted. Endotracheally intubated and Trach removed, with no further active bleeding noted.   PCCM consulted for admission in this settng  Pertinent  Medical History  Etoh abuse Alcohol withdrawal seizure   Significant Hospital Events: Including procedures, antibiotic start and stop dates in addition to other pertinent events   7/23 ED from [facility] with trach bleeding. Orally intubated in the ED, ENT consulted 7/25 weaned off levo 7/27 extubated, precedex started and up titrated  7/28 very agitated started libirum taper  7/29 PEA cardiac arrest, trach re-placed, bed side bronchoscopy with no obvious signs of hemoptysis from lower bronchial tree 7/30 MRI brain with severe anoxic brain injury noted  Interim History / Subjective:   No acute events overnight.   Patient wife and daughter at bedside. Update provided about MRI showing anoxic brain injury and neurology's input that he would not have meaningful recovery. His code status has been changed to DNR.  Objective   Blood pressure (!) 105/57, pulse 63, temperature (!) 97.2 F (36.2 C), temperature source Axillary, resp. rate (!) 21, height 6'  (1.829 m), weight 105.7 kg, SpO2 97 %.    Vent Mode: PRVC FiO2 (%):  [30 %-40 %] 30 % Set Rate:  [12 bmp] 12 bmp Vt Set:  [530 mL] 530 mL PEEP:  [5 cmH20] 5 cmH20 Plateau Pressure:  [16 cmH20-18 cmH20] 16 cmH20   Intake/Output Summary (Last 24 hours) at 05/21/2022 8546 Last data filed at 05/21/2022 0800 Gross per 24 hour  Intake 1176.26 ml  Output 1150 ml  Net 26.26 ml   Filed Weights   05/17/22 0500 05/20/22 0500 05/21/22 0305  Weight: 107.5 kg 107 kg 105.7 kg    Examination: General: sedated, intubated, cEEG in place, no acute distress  HENT: tracheostomy inplace, bloody dressing surround tracheostomy site  Pulm: course breath sounds on right, no wheezing Card: RRR, no murmurs Abd: soft, non-distended, BS+ Ext: warm, trace RLE edema Neuro: sedated, eyes with upward gaze GU: foley in place  Resolved Hospital Problem list   Mssa PNA Strep bacteremia  Septic shock  H/o Strep Bacteremia Shock Lactic Acidosis  Assessment & Plan:   PEA Cardiac Arrest 7/29 re-bleed at tracheostomy site with respiratory arrest leading to PEA cardiac arrest.  - supportive care  Acute respiratory failure with hypoxia w/ VDRF, s/p tracheostomy -- in setting of tongue lac from etoh withdrawal seizure Tracheostomy complication (looks like stoma eroded) with new bleeding  c/b Aspiration PNA  -trach placed at [facility] 7/18. At [facility] on rounds 7/23 noted to have extensive bleeding; successfully extubated 7/27; trach replaced 7/29 during PEA cardiac arrest due to bleeding at trach site again - Continue ventilator support - No need for further trach evaluation by ENT  due to future transition to comfort care.  - Unasyn for aspiration  Anoxic Brain Injury Acute encephalopathy - shock, toxic metabolic  EtOH abuse disorder Recent alcohol withdrawal c/b seizures Hx of seizure disorder -1 sz on 7/8. When withdrawal window passed, AEDs weaned and reportedly no further sz activity  -CT head 7/11 No  acute findings, MRI brain 7/15 no acute findings, LP obtained and was non-infectious. Started lactulose for hyperammonemia -at time of discharge, noted to have " improving" mental status but not at baseline.  - Supportive care - Cont thiamine, folate and MVI - Neurology following - Continue keppra - MRI with evidence of anoxic brain injury - cEEG in place  Acute Kidney Injury Hypokalemia - monitor Cr and UOP - replete potassium  Urinary retention - foley in place  Anemia Thrombocytopenia -transfused 7/27; hgb stable today as are PLTs stable  Plan Trend cbc Trigger for transfusion < 7 SCDs  Protein Calorie Malnutrition Hypoalbuminemia - resume TF when able  Best Practice (right click and "Reselect all SmartList Selections" daily)   Diet/type: NPO w/ meds via tube DVT prophylaxis: SCD GI prophylaxis: PPI Lines: Central line Foley:  Yes, and it is still needed.  Code Status:  DNR Last date of multidisciplinary goals of care discussion [7/31 spoke with wife and daughters at bedside. Patient has been made DNR. Will likely transition to comfort care in coming days.]  Critical care time: 35 min   Melody Comas, MD [facility] Office: [phone]   See Amion for personal pager PCCM on call pager [phone] until 7pm. Please call Elink 7p-7a. [phone]

## 2022-05-22 DIAGNOSIS — J9500 Unspecified tracheostomy complication: Secondary | ICD-10-CM | POA: Diagnosis not present

## 2022-05-22 LAB — GLUCOSE, CAPILLARY
Glucose-Capillary: 120 mg/dL — ABNORMAL HIGH (ref 70–99)
Glucose-Capillary: 142 mg/dL — ABNORMAL HIGH (ref 70–99)

## 2022-05-22 MED ORDER — MORPHINE BOLUS VIA INFUSION
5.0000 mg | INTRAVENOUS | Status: DC | PRN
  Administered 2022-05-22 (×14): 5 mg via INTRAVENOUS

## 2022-05-22 MED ORDER — GLYCOPYRROLATE 0.2 MG/ML IJ SOLN
0.2000 mg | INTRAMUSCULAR | Status: DC | PRN
  Administered 2022-05-22: 0.2 mg via INTRAVENOUS
  Filled 2022-05-22: qty 1

## 2022-05-22 MED ORDER — POLYVINYL ALCOHOL 1.4 % OP SOLN: 1.0000 [drp] | Freq: Four times a day (QID) | OPHTHALMIC | Status: DC | PRN

## 2022-05-22 MED ORDER — ACETAMINOPHEN 650 MG RE SUPP: 650.0000 mg | Freq: Four times a day (QID) | RECTAL | Status: DC | PRN

## 2022-05-22 MED ORDER — SODIUM CHLORIDE 0.9 % IV SOLN: INTRAVENOUS | Status: DC

## 2022-05-22 MED ORDER — GLYCOPYRROLATE 1 MG PO TABS: 1.0000 mg | ORAL_TABLET | ORAL | Status: DC | PRN

## 2022-05-22 MED ORDER — GLYCOPYRROLATE 0.2 MG/ML IJ SOLN: 0.2000 mg | INTRAMUSCULAR | Status: DC | PRN

## 2022-05-22 MED ORDER — ACETAMINOPHEN 325 MG PO TABS: 650.0000 mg | ORAL_TABLET | Freq: Four times a day (QID) | ORAL | Status: DC | PRN

## 2022-05-22 MED ORDER — MORPHINE 100MG IN NS 100ML (1MG/ML) PREMIX INFUSION
0.0000 mg/h | INTRAVENOUS | Status: DC
  Administered 2022-05-22: 20 mg/h via INTRAVENOUS
  Administered 2022-05-22: 5 mg/h via INTRAVENOUS
  Filled 2022-05-22 (×3): qty 100

## 2022-05-22 DEATH — deceased

## 2022-06-04 MED FILL — Medication: Qty: 1 | Status: AC

## 2022-06-22 NOTE — Consult Note (Signed)
WOC nursing team will sign off, patient to transition to comfort care in the next couple of days.   Please re-consult if needed.  Jareth Pardee [facility], CNS, [employer] [phone]

## 2022-06-22 NOTE — Progress Notes (Signed)
NAME:  Spencer Humphrey, MRN:  [id number], DOB:  December 12, 1965, LOS: 9 ADMISSION DATE:  05/09/2022, CONSULTATION DATE:  05/09/2022 REFERRING MD:  Donnald Garre - EM, CHIEF COMPLAINT:  tracheostomy complication.    History of Present Illness:  56 yo CM PMH etoh abuse, etoh withdrawal seizure, s/p trachostomy, MSSA VAP strep bacteremia who presented to [facility] ED 7/23 via  EMS from [facility] for bleeding trach and associated hypotension.   Admitted to [facility] from 7/6 - 7/20 with alcohol withdrawal seizures, tongue laceration.  Hospital course at [facility] complicated by vent associated pneumonia with MSSA, streptococcal bacteremia. Tracheostomy placed on 7/18 for failure to wean and transferred to [facility]  In ED, required pressors for SBP 60s and transfusion via [place]. R fem CVC placed. ENT consulted. Endotracheally intubated and Trach removed, with no further active bleeding noted.   PCCM consulted for admission in this settng  Pertinent  Medical History  Etoh abuse Alcohol withdrawal seizure   Significant Hospital Events: Including procedures, antibiotic start and stop dates in addition to other pertinent events   7/23 ED from [facility] with trach bleeding. Orally intubated in the ED, ENT consulted 7/25 weaned off levo 7/27 extubated, precedex started and up titrated  7/28 very agitated started libirum taper  7/29 PEA cardiac arrest, trach re-placed, bed side bronchoscopy with no obvious signs of hemoptysis from lower bronchial tree 7/30 MRI brain with severe anoxic brain injury noted  Interim History / Subjective:   No acute events overnight.   Patient's wife is at bedside. Will plan to transition to comfort measures today. [facility] will meet with wife.   Objective   Blood pressure (!) 110/56, pulse 83, temperature 97.7 F (36.5 C), temperature source Axillary, resp. rate 14, height 6' (1.829 m), weight 106.1 kg, SpO2 94 %.    Vent Mode: PRVC FiO2 (%):  [30 %-50 %] 50  % Set Rate:  [12 bmp] 12 bmp Vt Set:  [530 mL] 530 mL PEEP:  [5 cmH20-8 cmH20] 8 cmH20 Plateau Pressure:  [17 cmH20-27 cmH20] 24 cmH20   Intake/Output Summary (Last 24 hours) at Jun 04, 2022 0959 Last data filed at 06/04/2022 0700 Gross per 24 hour  Intake 992.84 ml  Output 630 ml  Net 362.84 ml   Filed Weights   05/20/22 0500 05/21/22 0305 2022-06-04 0445  Weight: 107 kg 105.7 kg 106.1 kg    Examination: General: trache in place on vent, no acute distress  HENT: tracheostomy inplace, bloody dressing surround tracheostomy site  Pulm: course breath sounds, no wheezing Card: RRR, no murmurs Abd: soft, non-distended, BS+ Ext: warm, trace RLE edema Neuro: eyes with upward gaze GU: foley in place  Resolved Hospital Problem list   Mssa PNA Strep bacteremia  Septic shock  H/o Strep Bacteremia Shock Lactic Acidosis  Assessment & Plan:   PEA Cardiac Arrest 7/29 re-bleed at tracheostomy site with respiratory arrest leading to PEA cardiac arrest.  - supportive care  Acute respiratory failure with hypoxia w/ VDRF, s/p tracheostomy -- in setting of tongue lac from etoh withdrawal seizure Tracheostomy complication (looks like stoma eroded) with new bleeding  c/b Aspiration PNA  -trach placed at [facility] 7/18. At [facility] on rounds 7/23 noted to have extensive bleeding; successfully extubated 7/27; trach replaced 7/29 during PEA cardiac arrest due to bleeding at trach site again - Continue ventilator support - No need for further trach evaluation by ENT due to future transition to comfort care.  - Unasyn for aspiration  Anoxic Brain Injury  Acute encephalopathy - shock, toxic metabolic  EtOH abuse disorder Recent alcohol withdrawal c/b seizures Hx of seizure disorder -1 sz on 7/8. When withdrawal window passed, AEDs weaned and reportedly no further sz activity  -CT head 7/11 No acute findings, MRI brain 7/15 no acute findings, LP obtained and was non-infectious. Started lactulose for  hyperammonemia -at time of discharge, noted to have " improving" mental status but not at baseline.  - Supportive care - Cont thiamine, folate and MVI - Neurology has signed off due to plan for withdrawal of care - Continue keppra - MRI with evidence of anoxic brain injury  Acute Kidney Injury - monitor Cr and UOP  Urinary retention - foley in place  Anemia Thrombocytopenia -transfused 7/27; hgb stable today as are PLTs stable  Plan SCDs  Protein Calorie Malnutrition Hypoalbuminemia - NPO for now  Best Practice (right click and "Reselect all SmartList Selections" daily)   Diet/type: NPO w/ meds via tube DVT prophylaxis: SCD GI prophylaxis: PPI Lines: Central line Foley:  Yes, and it is still needed.  Code Status:  DNR Last date of multidisciplinary goals of care discussion [8/1 spoke with wife. Plan to transition to comfort care today.  Critical care time: 31 min   Melody Comas, MD [facility] Office: [phone]   See Amion for personal pager PCCM on call pager [phone] until 7pm. Please call Elink 7p-7a. [phone]

## 2022-06-22 NOTE — Progress Notes (Signed)
Patient was taken off the ventilator at 1128 as part of comfort care. Spencer Humphrey was left in per MD as it is sutured in. Family was present at bedside.

## 2022-06-22 NOTE — Death Summary Note (Signed)
DEATH SUMMARY   Patient Details  Name: Spencer Humphrey MRN: [id number] DOB: January 19, 1966  Admission/Discharge Information   Admit Date:  2022-05-17  Date of Death: Date of Death: 26-May-2022  Time of Death: Time of Death: 02/02/19 (Cardiac death)  Length of Stay: 2023/02/02  Referring Physician: Simona Huh, NP   Reason(s) for Hospitalization  Bleeding from Tracheostomy  Diagnoses  Preliminary cause of death:  Anoxic Brain Injury Cardiac Arrest  Secondary Diagnoses (including complications and co-morbidities):  Principal Problem:   Tracheostomy complication ([place]) Active Problems:   Pressure injury of skin   Acute metabolic encephalopathy   Shock ([place]) Cardiac Arrest Mssa PNA Strep bacteremia  Septic shock  H/o Strep Bacteremia Acute encephalopathy Acute Kidney Injury Anemia Thrombocytopenia Protein Calorie Malnutrition Hypoalbuminemia  Brief Hospital Course (including significant findings, care, treatment, and services provided and events leading to death)  Spencer Humphrey is a 56 y.o. year old male with a history of EtOH abuse, was sober for many years and then started drinking again a year ago admitted with withdrawal seizure and tongue bite to [facility] from 7/6 to 7/20.  Prolonged course complicated by MSSA pneumonia, strep angiosis bacteremia, persistent encephalopathy requiring tracheostomy on 7/18. He was transferred to [facility] on 7/20, noted to have bleeding from tracheostomy site with hypotension and transfused 2 units PRBC and placed on levophed. Seen by ENT, orally intubated and trach removed , clot suctioned from airway.  Tracheostomy wound was packed and no active bleeding noted. PCCM asked to admit to the ICU. He was weaned off levophed on 7/25 and extubated on 7/27. He had encephalopathy with developing agitation which was treated with librium taper. The night of 7/29 he re-bleed from his tracheostomy site with respiratory arrest leading to PEA cardiac arrest. He underwent 20  minutes of CPR with difficulty obtaining airway until tracheostomy was placed through his existing stoma. He developed myoclonic jerking activity in which propofol was started. Neurology was consulted for continuous eeg which showed evidence of myoclonic seizures. Brain MRI 7/30 confirmed severe anoxic brain injury. Family meetings were held with patient's wife and family. It was decided to transition to comfort care on 05-27-23 and patient passed away on 05/26/2022 at 1720.  Pertinent Labs and Studies  Significant Diagnostic Studies MR BRAIN WO CONTRAST  Result Date: 05/20/2022 CLINICAL DATA:  Anoxic brain damage EXAM: MRI HEAD WITHOUT CONTRAST TECHNIQUE: Multiplanar, multiecho pulse sequences of the brain and surrounding structures were obtained without intravenous contrast. COMPARISON:  CT head without contrast 05/19/2022 and CT head without contrast 05/20/2022. MR head without contrast 06/14/2014 FINDINGS: Brain: The basal ganglia and thalami are symmetrically T2 hyperintense. Mild restricted diffusion is noted as well. Mild restricted diffusion is also present cerebellar gray matter. No acute hemorrhage or mass lesion is present. The ventricles are of normal size. No significant extraaxial fluid collection is present. Vascular: Flow is present in the major intracranial arteries. Skull and upper cervical spine: Oral airway is in place. Craniocervical junction is normal. Sinuses/Orbits: Extensive sinus disease is present. Fluid is present in the nasopharynx. Bilateral mastoid effusions are present. The globes and orbits are within normal limits. IMPRESSION: 1. Symmetric T2 hyperintense signal and restricted diffusion involving the basal ganglia and thalami bilaterally as well as the cerebellar gray matter. Findings are consistent with anoxic brain injury. 2. Extensive sinus disease. 3. Bilateral mastoid effusions. Electronically Signed   By: San Morelle M.D.   On: 05/20/2022 21:06   Overnight EEG with  video  Result Date: 05/20/2022  Lora Havens, MD     05/20/2022 10:23 AM Patient Name: Spencer Humphrey MRN: [id number] Epilepsy Attending: Lora Havens Referring Physician/Provider: Lora Havens, MD Duration: 05/19/2022 0710 05/20/2022 0710  Patient history: 56yo M s/p cardiac arrest. EEG to evaluate for seizure  Level of alertness: comatose  AEDs during EEG study: propofol, LEV  Technical aspects: This EEG study was done with scalp electrodes positioned according to the 10-20 International system of electrode placement. Electrical activity was reviewed with band pass filter of 1-70Hz , sensitivity of 7 uV/mm, display speed of 60mm/sec with a 60Hz  notched filter applied as appropriate. EEG data were recorded continuously and digitally stored.  Video monitoring was available and reviewed as appropriate.  Description: At the beginning of study, patient was noted to have episodes of sudden eye opening with whole body jerking lasting 2-3 seconds about once every hour. Concomitant EEG showed generalized polyspikes consistent with myoclonic seizures. Last seizure was noted on 05/19/2022 at 1333. EEG then showed generalized background suppression not reactive to tactile stimulation. Hyperventilation and photic stimulation were not performed.       ABNORMALITY -Myoclonic seizure, generalized -Background suppression, generalized  IMPRESSION: This study initially showed myoclonic seizures lasting 2-3 seconds about once every hour. Last seizure was noted on 05/19/2022 at 1333.  EEG was then suggestive of profound diffuse encephalopathy. In the setting of cardiac arrest, this EEG pattern is suggestive of anoxic/hypoxic brain injury.  [place]    CT HEAD WO CONTRAST (5MM)  Result Date: 05/20/2022 CLINICAL DATA:  56 year old male status post cardiac arrest. Subsequent generalized myoclonic jerks. Abnormal EEG. Suspected anoxic injury. EXAM: CT HEAD WITHOUT CONTRAST TECHNIQUE: Contiguous axial images were  obtained from the base of the skull through the vertex without intravenous contrast. RADIATION DOSE REDUCTION: This exam was performed according to the departmental dose-optimization program which includes automated exposure control, adjustment of the mA and/or kV according to patient size and/or use of iterative reconstruction technique. COMPARISON:  Head CT 29.  Head CT and brain MRI 02/12/2014. FINDINGS: Brain: Ventricles and sulci appear slightly larger compared to the 2015 CT compatible with some interval cerebral volume loss. No midline shift, mass effect, or evidence of intracranial mass lesion. Basilar cisterns are normal. No acute intracranial hemorrhage identified. Gray-white differentiation does appear muted throughout the brain compared to the 2015 exam. And in particular, there is poor differentiation at the deep gray nuclei and deep white matter capsules. But there is no obvious cerebral edema. Vascular: Mild Calcified atherosclerosis at the skull base. No suspicious intracranial vascular hyperdensity. Skull: Chronic appearing nasal bone fractures. Congenital incomplete ossification of the posterior C1 ring. No acute osseous abnormality identified. Sinuses/Orbits: Pansinus opacification, right greater than left middle ear and mastoid opacification unchanged from yesterday. Right nasoenteric tube in place. Other: Partially visible fluid in the pharynx with nasoenteric tube. Disconjugate gaze. Scalp EEG electrodes. IMPRESSION: Diminished gray-white matter differentiation throughout the brain - and especially at the deep gray nuclei and deep white matter capsules - when compared to a 2015 CT. However, no intracranial mass effect, hemorrhage, or obvious cerebral edema. This constellation is suspicious for recent anoxic injury. Noncontrast Brain MRI (could be compared to 2015 exam) or repeat head CT in several days would best confirm. Electronically Signed   By: Genevie Ann M.D.   On: 05/20/2022 07:08   CT  ANGIO CHEST AORTA W/CM & OR WO/CM  Result Date: 05/19/2022 CLINICAL DATA:  56 year old male with history of hemoptysis. Bleeding from tracheostomy  site. EXAM: CT ANGIOGRAPHY CHEST WITH CONTRAST TECHNIQUE: Multidetector CT imaging of the chest was performed using the standard protocol during bolus administration of intravenous contrast. Multiplanar CT image reconstructions and MIPs were obtained to evaluate the vascular anatomy. RADIATION DOSE REDUCTION: This exam was performed according to the departmental dose-optimization program which includes automated exposure control, adjustment of the mA and/or kV according to patient size and/or use of iterative reconstruction technique. CONTRAST:  144mL OMNIPAQUE IOHEXOL 350 MG/ML SOLN COMPARISON:  No priors. FINDINGS: Cardiovascular: No acute abnormality of the thoracic aorta or the great vessels of the mediastinum. Specifically, no aneurysm or dissection. Heart size is normal. There is no significant pericardial fluid, thickening or pericardial calcification. There is aortic atherosclerosis, as well as atherosclerosis of the great vessels of the mediastinum and the coronary arteries, including calcified atherosclerotic plaque in the left anterior descending coronary artery. Mediastinum/Nodes: Postoperative changes of recent tracheostomy (per report, tracheostomy was performed on 05/08/2022) are noted. Tracheostomy tube appears appropriately located, with tip terminating 6.9 cm above the carina. In the soft tissues along the inferior aspect of the tracheostomy site there is some enhancing soft tissue in a small amount of gas, with haziness in the adjacent surrounding fat. This is of uncertain etiology and significance, but likely reflects granulation tissue at the site of the healing surgical wound. No well-defined fluid collection is identified in this region. No signs of active extravasation are confidently identified on today's study. There is some dependent material  in the trachea, bronchus intermedius and right lower lobe bronchus which may represent retained secretions or blood products given the patient's history of hemoptysis. No pathologically enlarged mediastinal or hilar lymph nodes. Feeding tube in the esophagus. Esophageal temperature probe. Esophagus is otherwise unremarkable in appearance. No axillary lymphadenopathy. Lungs/Pleura: Patchy areas of ground-glass attenuation and thickening of the peribronchovascular interstitium are noted in the lungs bilaterally, most severe in the lower lobes of the lungs, likely reflecting mild aspiration pneumonitis. Some dependent atelectasis is also noted in the lower lobes of the lungs bilaterally as well. No pleural effusions. No definite suspicious appearing pulmonary nodules or masses are noted. Upper Abdomen: Liver has a slightly shrunken appearance and nodular contour, indicative of underlying cirrhosis. Dependent intermediate attenuation material in the gallbladder likely reflects biliary sludge. Spleen is incompletely imaged, but appears enlarged measuring at least 15.9 x 8.7 cm on axial images. Musculoskeletal: There are no aggressive appearing lytic or blastic lesions noted in the visualized portions of the skeleton. Mild compression fractures are noted at T4, T5 and T6, chronic in appearance, most severe at T4 where there is 20% loss of anterior vertebral body height. Review of the MIP images confirms the above findings. IMPRESSION: 1. Postoperative changes of recent tracheostomy with what appears to be a healing tracheostomy wound. There is a small amount of gas in the soft tissues of the wound, which may be within normal limits given the recent surgery. Avid enhancement of the soft tissues likely reflects granulation tissue. No definite active extravasation confidently identified on today's examination. 2. There is dependent material in the trachea, bronchus intermedius and right mainstem bronchus which may represent  aspirated material blood products, or retained secretions. There are dependent opacities in the lungs bilaterally suggesting mild aspiration pneumonitis, in addition to some mild subsegmental atelectasis. 3. Aortic atherosclerosis, in addition to left anterior descending coronary artery disease. Please note that although the presence of coronary artery calcium documents the presence of coronary artery disease, the severity of this disease and  any potential stenosis cannot be assessed on this non-gated CT examination. Assessment for potential risk factor modification, dietary therapy or pharmacologic therapy may be warranted, if clinically indicated. 4. Cirrhosis. 5. Splenomegaly. 6. Biliary sludge in the gallbladder. Aortic Atherosclerosis (ICD10-I70.0). Electronically Signed   By: Vinnie Langton M.D.   On: 05/19/2022 11:47   CT ANGIO NECK W OR WO CONTRAST  Result Date: 05/19/2022 CLINICAL DATA:  Bleeding at tracheostomy site. Question vascular fistula. EXAM: CT ANGIOGRAPHY NECK TECHNIQUE: Multidetector CT imaging of the neck was performed using the standard protocol during bolus administration of intravenous contrast. Multiplanar CT image reconstructions and MIPs were obtained to evaluate the vascular anatomy. Carotid stenosis measurements (when applicable) are obtained utilizing NASCET criteria, using the distal internal carotid diameter as the denominator. RADIATION DOSE REDUCTION: This exam was performed according to the departmental dose-optimization program which includes automated exposure control, adjustment of the mA and/or kV according to patient size and/or use of iterative reconstruction technique. CONTRAST:  190mL OMNIPAQUE IOHEXOL 350 MG/ML SOLN COMPARISON:  None Available. FINDINGS: Aortic arch: Common origin of the left common carotid artery and innominate artery noted. Atherosclerotic calcifications present in the distal arch. No focal stenosis or aneurysm is present. Right carotid system: The  innominate artery is separate from the tracheostomy site. No evidence for fistula. The right common carotid artery is within normal limits. Bifurcation is unremarkable. Right internal carotid artery is within normal limits into the skull base. Left carotid system: The left common carotid artery is unremarkable. Bifurcation is within normal limits. The left internal carotid artery is within normal limits into the skull base. Vertebral arteries: Right vertebral artery is dominant vessel. Vertebral arteries originate from the subclavian arteries without significant stenosis. No significant stenosis is present in either vertebral artery in the neck. The left vertebral artery terminates at PICA. The right vertebral artery forms the basilar artery. No focal stenosis is present. Skeleton: Degenerative changes present cervical spine most C5-6 right. No focal osseous lesions are present. Other neck: Extensive secretions are present in the nasopharynx and oropharynx. No definite mass lesion is present. No or tibial source of hemorrhage is identified. NG tube and oral temperature probe are in place. Soft tissues the neck are otherwise unremarkable. Upper chest: Lung apices are clear. Thoracic inlet is within normal limits. IMPRESSION: 1. No evidence for fistula or other source of hemorrhage at the trachea site. 2. Extensive secretions in the nasopharynx and oropharynx without definite mass lesion. Some of this could be hemorrhage. No definite source. 3. NG tube and oral temperature probe are in place. Electronically Signed   By: San Morelle M.D.   On: 05/19/2022 11:21   CT HEAD WO CONTRAST (5MM)  Result Date: 05/19/2022 CLINICAL DATA:  Nontraumatic altered mental status. EXAM: CT HEAD WITHOUT CONTRAST TECHNIQUE: Contiguous axial images were obtained from the base of the skull through the vertex without intravenous contrast. RADIATION DOSE REDUCTION: This exam was performed according to the departmental  dose-optimization program which includes automated exposure control, adjustment of the mA and/or kV according to patient size and/or use of iterative reconstruction technique. COMPARISON:  February 12, 2014 CT of the brain FINDINGS: Brain: No evidence of acute infarction, hemorrhage, hydrocephalus, extra-axial collection or mass lesion/mass effect. Vascular: No hyperdense vessel or unexpected calcification. Skull: Normal. Negative for fracture or focal lesion. Sinuses/Orbits: There is fluid in the bilateral mastoid air cells and middle ears. There is near complete opacification of the maxillary and sphenoid sinuses with mucosal thickening in multiple ethmoid  air cells in the left frontal sinus. Other: None. IMPRESSION: 1. No acute intracranial abnormality. 2. Significant paranasal sinus disease as above. 3. Fluid fills most of the bilateral mastoid air cells and extends into the middle ears. No bony erosion identified. Electronically Signed   By: Dorise Bullion III M.D.   On: 05/19/2022 11:13   EEG adult  Result Date: 05/19/2022 Lora Havens, MD     05/19/2022  7:21 AM Patient Name: Spencer Humphrey MRN: [id number] Epilepsy Attending: Lora Havens Referring Physician/Provider: Mick Sell, PA-C Date: 05/19/2022 Duration: 30.04 mins Patient history: 56yo M s/p cardiac arrest. EEG to evaluate for seizure Level of alertness: comatose AEDs during EEG study: None Technical aspects: This EEG study was done with scalp electrodes positioned according to the 10-20 International system of electrode placement. Electrical activity was reviewed with band pass filter of 1-70Hz , sensitivity of 7 uV/mm, display speed of 24mm/sec with a 60Hz  notched filter applied as appropriate. EEG data were recorded continuously and digitally stored.  Video monitoring was available and reviewed as appropriate. Description: Patient was noted to have episodes of sudden eye opening with whole body jerking lasting 6-8 seconds every 4-7  minutes. Concomitant EEG showed generalized polyspikes consistent with myoclonic seizures.  In between seizures EEG showed generalized background suppression. Hyperventilation and photic stimulation were not performed.      ABNORMALITY -Myoclonic seizure, generalized -Background suppression, generalized IMPRESSION: This study showed myoclonic seizures lasting 6-8 seconds every 4-7 minutes. Additionally there was evidence of profound diffuse encephalopathy. In the setting of cardiac arrest, this EEG pattern is suggestive of anoxic/hypoxic brain injury. Dr. Lynetta Mare and Rollene Rotunda, PA was notified. Lora Havens   DG Chest Port 1 View  Result Date: 05/19/2022 CLINICAL DATA:  Tracheostomy bleeding EXAM: PORTABLE CHEST 1 VIEW COMPARISON:  05/18/2022 FINDINGS: Tracheostomy tube is now noted in satisfactory position. Feeding catheter is seen in place. Cardiac shadow is within normal limits. Lungs are clear bilaterally. No bony abnormality is seen. IMPRESSION: No acute abnormality noted. Electronically Signed   By: Inez Catalina M.D.   On: 05/19/2022 03:11   DG CHEST PORT 1 VIEW  Result Date: 05/18/2022 CLINICAL DATA:  Respiratory failure. EXAM: PORTABLE CHEST 1 VIEW COMPARISON:  AP chest 05/15/2022 FINDINGS: Interval extubation. An enteric tube descends below the diaphragm with the tip excluded by collimation. This appears to be a different tube compared to that seen on 05/15/2022 comparison radiograph. Moderately decreased lung volumes are worsened from prior. Cardiac silhouette and mediastinal contours are grossly within normal limits. Bibasilar bronchovascular crowding. No focal airspace opacity to indicate pneumonia. No acute skeletal abnormality. Multilevel disc space narrowing and endplate osteophytes throughout the thoracic spine. IMPRESSION: 1. Interval extubation. 2. Decreased lung volumes with bibasilar bronchovascular crowding. No radiographic evidence of pneumonia. Electronically Signed   By: Yvonne Kendall M.D.   On: 05/18/2022 17:53   DG Abd 1 View  Result Date: 05/17/2022 CLINICAL DATA:  Bleeding from tracheostomy EXAM: ABDOMEN - 1 VIEW COMPARISON:  05/15/2022 FINDINGS: Esophageal tube tip overlies the distal stomach. Upper gas pattern is unobstructed IMPRESSION: Esophageal tube tip overlies the distal stomach Electronically Signed   By: Donavan Foil M.D.   On: 05/17/2022 18:56   DG Abd Portable 1V  Result Date: 05/15/2022 CLINICAL DATA:  56 year old male feeding tube placement. EXAM: PORTABLE ABDOMEN - 1 VIEW COMPARISON:  05/14/2022 and earlier. FINDINGS: Portable AP semi upright view at 1011 hours. NG type tube removed and enteric feeding tube placed into  the stomach. Tip is at the level of the proximal gastric body. Grossly negative lung bases. Paucity of bowel gas. No acute osseous abnormality identified. IMPRESSION: NG tube removed and enteric feeding tube placed into the stomach, tip at the level of the proximal gastric body. Electronically Signed   By: Odessa Fleming M.D.   On: 05/15/2022 10:20   DG Chest Port 1 View  Result Date: 05/15/2022 CLINICAL DATA:  Bleeding from the endotracheal tube. EXAM: PORTABLE CHEST 1 VIEW COMPARISON:  05/14/2022. FINDINGS: The ET and NG tubes are in good position, unchanged. There is overlying artifact on the right chest. The lungs are grossly clear. No pleural effusions or pneumothorax. IMPRESSION: 1. ET and NG tubes in good position. 2. No acute pulmonary findings. Electronically Signed   By: Rudie Meyer M.D.   On: 05/15/2022 08:19   DG CHEST PORT 1 VIEW  Result Date: 05/14/2022 CLINICAL DATA:  Ventilator dependent respiratory failure. Trach bleeding. EXAM: PORTABLE CHEST 1 VIEW COMPARISON:  Portable chest 05/12/2022. FINDINGS: ETT tip is 5.6 cm from the carina. NGT tip is probably in the body of stomach based on the position of the proximal side-hole but the tip is not seen on the film. There is a length of tubing measuring approximally 12-13 cm in the  superior aspect of the film which is folded back on itself with kinking, and would either be overlying the patient or in the cervical esophagus. Clinical correlation is advised. Regardless of whether the tube is in the cervical esophagus or overlying the patient, the kinking could affect tube functioning. There is mild cardiomegaly. Central vessels are normal caliber. The mediastinal configuration is stable with aortic atherosclerosis in the arch. The lungs are expiratory. No pleural effusion or pneumothorax is seen. There are bilateral perihilar atelectatic bands, right infrahilar atelectatic bands and streaky consolidation or atelectasis increased in the retrocardiac left lower lobe. The upper lung fields are clear. In all other respects no further changes. IMPRESSION: 1. Support tube positioning as above. The prior Dobbhoff feeding tube has been exchanged for a standard NGT. A 12-13 cm length of this tubing is folded back on itself at the superior aspect of the film, possibly within the cervical esophagus, with kinking at the fold. Clinical correlation advised. 2. Expiratory study with increased opacity in the retrocardiac left lower lobe which could be increased atelectasis, consolidation or aspiration. 3. Cardiomegaly.  Aortic atherosclerosis. Electronically Signed   By: Almira Bar M.D.   On: 05/14/2022 20:50   DG Abd Portable 1V  Result Date: 05/14/2022 CLINICAL DATA:  Orogastric tube placement EXAM: PORTABLE ABDOMEN - 1 VIEW COMPARISON:  24-May-2022 FINDINGS: Limited radiograph of the lower chest and upper abdomen was obtained for the purposes of enteric tube localization. Enteric tube is seen coursing below the diaphragm with distal tip and side port terminating within the expected location of the gastric body. IMPRESSION: Enteric tube tip and side port are within the expected location of the gastric body. Electronically Signed   By: Duanne Guess D.O.   On: 05/14/2022 17:24   DG Abd 1  View  Result Date: 24-May-2022 CLINICAL DATA:  [id number].  OG tube placement EXAM: ABDOMEN - 1 VIEW COMPARISON:  X-ray abdomen 05-24-22 FINDINGS: Two enteric tubes noted with tip coursing below the hemidiaphragm likely just distal to the gastroesophageal junction. The bowel gas pattern is normal. No radio-opaque calculi or other significant radiographic abnormality are seen. IMPRESSION: Two enteric tubes noted with tip coursing below the hemidiaphragm likely just  distal to the gastroesophageal junction. Electronically Signed   By: Tish Frederickson M.D.   On: 05/18/2022 17:11   DG Abd 1 View  Result Date: 05/05/2022 CLINICAL DATA:  OG tube placement EXAM: ABDOMEN - 1 VIEW COMPARISON:  12/06/2017 FINDINGS: Two enteric tubes appear to be present, with tips in the left upper quadrant projecting just below the level of the EG junction, consistent with location in the upper stomach. Gas-filled small bowel without distention, likely ileus. IMPRESSION: Enteric tubes present with tips projecting just below the level of the EG junction. Electronically Signed   By: Burman Nieves M.D.   On: 05/16/2022 17:10   DG Chest Port 1 View  Result Date: 05/03/2022 CLINICAL DATA:  Trauma. Complains of bleeding around tracheostomy site. EXAM: PORTABLE CHEST 1 VIEW COMPARISON:  02/13/14 FINDINGS: There is a endotracheal tube with tip 5.4 cm above the carina. A feeding tube is in place with tip in the stomach. Stable cardiomediastinal contours. Mild left lung base and right perihilar streaky opacities are favored to represent areas of atelectasis. Airspace disease or aspiration not excluded. IMPRESSION: 1. Endotracheal tube in place with tip 5.4 cm above the carina. 2. Right perihilar and left lung base opacities are favored to represent areas of atelectasis. Airspace disease or aspiration not excluded. Electronically Signed   By: Signa Kell M.D.   On: 05/04/2022 09:09    Microbiology Recent Results (from the past 240  hour(s))  Culture, Respiratory w Gram Stain     Status: None   Collection Time: 05/16/22  3:30 PM   Specimen: Tracheal Aspirate; Respiratory  Result Value Ref Range Status   Specimen Description TRACHEAL ASPIRATE  Final   Special Requests NONE  Final   Gram Stain   Final    FEW WBC PRESENT,BOTH PMN AND MONONUCLEAR FEW GRAM POSITIVE COCCI IN PAIRS    Culture   Final    Normal respiratory flora-no Staph aureus or Pseudomonas seen Performed at [facility], [address] [place], [place] [address]    Report Status 05/19/2022 FINAL  Final    Lab Basic Metabolic Panel: Recent Labs  Lab 05/18/22 0435 05/19/22 0019 05/19/22 0256 05/19/22 0257 05/19/22 0630 05/19/22 1116 05/19/22 1716 05/20/22 0717 05/20/22 0802  NA 141 143 145   < > 145 146* 146* 144 147*  K 3.5 3.6 3.8   < > 3.3* 3.7 4.2 3.6 3.6  CL 112* 115* 113*  --  112*  --   --  116*  --   CO2 21* 22 16*  --  23  --   --  22  --   GLUCOSE 149* 150* 242*  --  199*  --   --  116*  --   BUN 29* 22* 24*  --  25*  --   --  33*  --   CREATININE 1.10 0.88 1.30*  --  1.20  --   --  1.26*  --   CALCIUM 9.2 9.2 8.7*  --  8.8*  --   --  8.4*  --   MG  --   --  2.1  --   --   --   --  2.0  --   PHOS  --   --   --   --   --   --   --  3.0  --    < > = values in this interval not displayed.   Liver Function Tests: Recent Labs  Lab  05/19/22 0019 05/19/22 0630 05/20/22 0717  AST 75* 85* 73*  ALT 29 33 30  ALKPHOS 152* 136* 111  BILITOT 1.4* 1.2 1.2  PROT 6.1* 5.9* 5.5*  ALBUMIN 2.0* 2.0* 1.9*   No results for input(s): "LIPASE", "AMYLASE" in the last 168 hours. No results for input(s): "AMMONIA" in the last 168 hours. CBC: Recent Labs  Lab 05/18/22 0435 05/19/22 0019 05/19/22 0257 05/19/22 0630 05/19/22 1116 05/19/22 1716 05/20/22 0717 05/20/22 0802  WBC 7.5 7.6  --  14.2*  --   --  9.3  --   NEUTROABS  --   --   --   --   --   --  6.4  --   HGB 8.0* 8.5*   < > 9.7* 7.8* 9.5* 8.3* 7.8*  HCT 25.1* 26.6*    < > 29.5* 23.0* 28.0* 25.6* 23.0*  MCV 98.8 98.5  --  93.7  --   --  94.8  --   PLT 129* 158  --  175  --   --  140*  --    < > = values in this interval not displayed.   Cardiac Enzymes: No results for input(s): "CKTOTAL", "CKMB", "CKMBINDEX", "TROPONINI" in the last 168 hours. Sepsis Labs: Recent Labs  Lab 05/18/22 0435 05/19/22 0019 05/19/22 0256 05/19/22 0630 05/19/22 0712 05/19/22 1212 05/20/22 0717  WBC 7.5 7.6  --  14.2*  --   --  9.3  LATICACIDVEN  --   --  >9.0* 3.3* 2.5* 1.6  --     Procedures/Operations  Endotracheal Intubation Bronchoscopy Continuous EEG Monitoring Central Line Placement   Freddi Starr 05/24/2022, 3:27 PM

## 2022-06-22 NOTE — Plan of Care (Signed)
Neurology brief update  Per chart review and d/w Dr. Francine Graven, patient is now DNR and family plans to transition to comfort care in the next few days. Neurology will sign off, but please re-engage if we can be of any further assistance.  Bing Neighbors, MD [facility] [phone]  If 7pm- 7am, please page neurology on call as listed in AMION.

## 2022-06-22 DEATH — deceased
